# Patient Record
Sex: Female | Born: 1965
Health system: Southern US, Community
[De-identification: ages and names within clinical notes are randomized; demographics above are authoritative.]

## PROBLEM LIST (undated history)

## (undated) DIAGNOSIS — C801 Malignant (primary) neoplasm, unspecified: Secondary | ICD-10-CM

## (undated) DIAGNOSIS — Z923 Personal history of irradiation: Secondary | ICD-10-CM

## (undated) DIAGNOSIS — U071 COVID-19: Secondary | ICD-10-CM

## (undated) DIAGNOSIS — G43909 Migraine, unspecified, not intractable, without status migrainosus: Secondary | ICD-10-CM

## (undated) DIAGNOSIS — Z8719 Personal history of other diseases of the digestive system: Secondary | ICD-10-CM

## (undated) DIAGNOSIS — F419 Anxiety disorder, unspecified: Secondary | ICD-10-CM

## (undated) DIAGNOSIS — IMO0002 Reserved for concepts with insufficient information to code with codable children: Secondary | ICD-10-CM

## (undated) HISTORY — DX: Anxiety disorder, unspecified: F41.9

## (undated) HISTORY — PX: AUGMENTATION MAMMAPLASTY: SUR837

## (undated) HISTORY — DX: Malignant (primary) neoplasm, unspecified: C80.1

## (undated) HISTORY — DX: Migraine, unspecified, not intractable, without status migrainosus: G43.909

## (undated) HISTORY — DX: Reserved for concepts with insufficient information to code with codable children: IMO0002

---

## 1999-03-18 ENCOUNTER — Ambulatory Visit (HOSPITAL_COMMUNITY): Admission: RE | Admit: 1999-03-18 | Discharge: 1999-03-18 | Payer: Self-pay | Admitting: Obstetrics and Gynecology

## 1999-03-18 ENCOUNTER — Encounter: Payer: Self-pay | Admitting: Obstetrics and Gynecology

## 1999-03-18 ENCOUNTER — Inpatient Hospital Stay (HOSPITAL_COMMUNITY): Admission: AD | Admit: 1999-03-18 | Discharge: 1999-03-21 | Payer: Self-pay | Admitting: Obstetrics & Gynecology

## 2001-03-04 ENCOUNTER — Other Ambulatory Visit: Admission: RE | Admit: 2001-03-04 | Discharge: 2001-03-04 | Payer: Self-pay | Admitting: *Deleted

## 2002-04-01 ENCOUNTER — Other Ambulatory Visit: Admission: RE | Admit: 2002-04-01 | Discharge: 2002-04-01 | Payer: Self-pay | Admitting: *Deleted

## 2002-09-30 HISTORY — PX: FOOT SURGERY: SHX648

## 2003-04-06 ENCOUNTER — Other Ambulatory Visit: Admission: RE | Admit: 2003-04-06 | Discharge: 2003-04-06 | Payer: Self-pay | Admitting: *Deleted

## 2004-03-17 ENCOUNTER — Ambulatory Visit: Payer: Self-pay | Admitting: Internal Medicine

## 2004-05-19 ENCOUNTER — Other Ambulatory Visit: Admission: RE | Admit: 2004-05-19 | Discharge: 2004-05-19 | Payer: Self-pay | Admitting: Obstetrics and Gynecology

## 2004-05-20 ENCOUNTER — Ambulatory Visit: Payer: Self-pay | Admitting: Internal Medicine

## 2004-05-23 ENCOUNTER — Ambulatory Visit: Payer: Self-pay

## 2004-06-17 ENCOUNTER — Ambulatory Visit: Payer: Self-pay | Admitting: Internal Medicine

## 2005-03-02 ENCOUNTER — Ambulatory Visit: Payer: Self-pay | Admitting: Internal Medicine

## 2005-06-12 ENCOUNTER — Other Ambulatory Visit: Admission: RE | Admit: 2005-06-12 | Discharge: 2005-06-12 | Payer: Self-pay | Admitting: Obstetrics and Gynecology

## 2005-09-08 ENCOUNTER — Ambulatory Visit: Payer: Self-pay | Admitting: Internal Medicine

## 2005-10-16 ENCOUNTER — Ambulatory Visit: Payer: Self-pay | Admitting: Internal Medicine

## 2006-01-05 ENCOUNTER — Ambulatory Visit: Payer: Self-pay | Admitting: Internal Medicine

## 2006-02-23 ENCOUNTER — Ambulatory Visit: Payer: Self-pay | Admitting: Internal Medicine

## 2006-06-01 ENCOUNTER — Ambulatory Visit: Payer: Self-pay | Admitting: Internal Medicine

## 2006-10-04 ENCOUNTER — Ambulatory Visit: Payer: Self-pay | Admitting: Internal Medicine

## 2006-10-04 LAB — CONVERTED CEMR LAB
ALT: 10 units/L (ref 0–40)
AST: 17 units/L (ref 0–37)
Albumin: 4.2 g/dL (ref 3.5–5.2)
Alkaline Phosphatase: 29 units/L — ABNORMAL LOW (ref 39–117)
BUN: 11 mg/dL (ref 6–23)
Basophils Absolute: 0 10*3/uL (ref 0.0–0.1)
Basophils Relative: 0.8 % (ref 0.0–1.0)
Bilirubin, Direct: 0.1 mg/dL (ref 0.0–0.3)
CO2: 29 meq/L (ref 19–32)
Calcium: 9.2 mg/dL (ref 8.4–10.5)
Chloride: 107 meq/L (ref 96–112)
Cholesterol: 177 mg/dL (ref 0–200)
Creatinine, Ser: 0.9 mg/dL (ref 0.4–1.2)
Eosinophils Absolute: 0.1 10*3/uL (ref 0.0–0.6)
Eosinophils Relative: 1.5 % (ref 0.0–5.0)
GFR calc Af Amer: 90 mL/min
GFR calc non Af Amer: 74 mL/min
Glucose, Bld: 94 mg/dL (ref 70–99)
HCT: 39.2 % (ref 36.0–46.0)
HDL: 45.9 mg/dL (ref >39.0)
Hemoglobin: 13.7 g/dL (ref 12.0–15.0)
LDL Cholesterol: 111 mg/dL — ABNORMAL HIGH (ref 0–99)
Lymphocytes Relative: 30 % (ref 12.0–46.0)
MCHC: 34.9 g/dL (ref 30.0–36.0)
MCV: 85.7 fL (ref 78.0–100.0)
Monocytes Absolute: 0.4 10*3/uL (ref 0.2–0.7)
Monocytes Relative: 8.3 % (ref 3.0–11.0)
Neutro Abs: 2.9 10*3/uL (ref 1.4–7.7)
Neutrophils Relative %: 59.4 % (ref 43.0–77.0)
Platelets: 219 10*3/uL (ref 150–400)
Potassium: 4.1 meq/L (ref 3.5–5.1)
RBC: 4.58 M/uL (ref 3.87–5.11)
RDW: 12.8 % (ref 11.5–14.6)
Sodium: 140 meq/L (ref 135–145)
TSH: 1.06 microintl units/mL (ref 0.35–5.50)
Total Bilirubin: 0.9 mg/dL (ref 0.3–1.2)
Total CHOL/HDL Ratio: 3.9
Total Protein: 6.9 g/dL (ref 6.0–8.3)
Triglycerides: 103 mg/dL (ref 0–149)
VLDL: 21 mg/dL (ref 0–40)
WBC: 4.8 10*3/uL (ref 4.5–10.5)

## 2006-10-30 ENCOUNTER — Ambulatory Visit: Payer: Self-pay | Admitting: Internal Medicine

## 2006-10-30 DIAGNOSIS — R519 Headache, unspecified: Secondary | ICD-10-CM | POA: Insufficient documentation

## 2006-10-30 DIAGNOSIS — R51 Headache: Secondary | ICD-10-CM

## 2006-10-30 DIAGNOSIS — G43909 Migraine, unspecified, not intractable, without status migrainosus: Secondary | ICD-10-CM

## 2007-02-12 ENCOUNTER — Ambulatory Visit: Payer: Self-pay | Admitting: Internal Medicine

## 2007-05-10 ENCOUNTER — Ambulatory Visit: Payer: Self-pay | Admitting: Internal Medicine

## 2007-11-13 ENCOUNTER — Ambulatory Visit: Payer: Self-pay | Admitting: Internal Medicine

## 2007-11-13 DIAGNOSIS — L570 Actinic keratosis: Secondary | ICD-10-CM

## 2008-02-04 ENCOUNTER — Ambulatory Visit: Payer: Self-pay | Admitting: Internal Medicine

## 2008-05-06 ENCOUNTER — Ambulatory Visit: Payer: Self-pay | Admitting: Internal Medicine

## 2008-05-06 LAB — CONVERTED CEMR LAB
ALT: 14 units/L (ref 0–35)
AST: 23 units/L (ref 0–37)
Albumin: 4.3 g/dL (ref 3.5–5.2)
Alkaline Phosphatase: 35 units/L — ABNORMAL LOW (ref 39–117)
BUN: 13 mg/dL (ref 6–23)
Basophils Absolute: 0 10*3/uL (ref 0.0–0.1)
Basophils Relative: 0 % (ref 0.0–3.0)
Bilirubin Urine: NEGATIVE
Bilirubin, Direct: 0.1 mg/dL (ref 0.0–0.3)
Blood in Urine, dipstick: NEGATIVE
CO2: 29 meq/L (ref 19–32)
Calcium: 9.3 mg/dL (ref 8.4–10.5)
Chloride: 105 meq/L (ref 96–112)
Cholesterol: 195 mg/dL (ref 0–200)
Creatinine, Ser: 0.8 mg/dL (ref 0.4–1.2)
Eosinophils Absolute: 0.1 10*3/uL (ref 0.0–0.7)
Eosinophils Relative: 1.6 % (ref 0.0–5.0)
GFR calc Af Amer: 102 mL/min
GFR calc non Af Amer: 84 mL/min
Glucose, Bld: 97 mg/dL (ref 70–99)
Glucose, Urine, Semiquant: NEGATIVE
HCT: 38.4 % (ref 36.0–46.0)
HDL: 51 mg/dL (ref >39.0)
Hemoglobin: 13.4 g/dL (ref 12.0–15.0)
Ketones, urine, test strip: NEGATIVE
LDL Cholesterol: 128 mg/dL — ABNORMAL HIGH (ref 0–99)
Lymphocytes Relative: 24.5 % (ref 12.0–46.0)
MCHC: 34.8 g/dL (ref 30.0–36.0)
MCV: 86.6 fL (ref 78.0–100.0)
Monocytes Absolute: 0.3 10*3/uL (ref 0.1–1.0)
Monocytes Relative: 7.1 % (ref 3.0–12.0)
Neutro Abs: 2.8 10*3/uL (ref 1.4–7.7)
Neutrophils Relative %: 66.8 % (ref 43.0–77.0)
Nitrite: NEGATIVE
Platelets: 199 10*3/uL (ref 150–400)
Potassium: 4.3 meq/L (ref 3.5–5.1)
Protein, U semiquant: NEGATIVE
RBC: 4.44 M/uL (ref 3.87–5.11)
RDW: 12.4 % (ref 11.5–14.6)
Sodium: 141 meq/L (ref 135–145)
Specific Gravity, Urine: 1.02
TSH: 1.04 microintl units/mL (ref 0.35–5.50)
Total Bilirubin: 0.7 mg/dL (ref 0.3–1.2)
Total CHOL/HDL Ratio: 3.8
Total Protein: 7.3 g/dL (ref 6.0–8.3)
Triglycerides: 81 mg/dL (ref 0–149)
Urobilinogen, UA: 0.2
VLDL: 16 mg/dL (ref 0–40)
WBC Urine, dipstick: NEGATIVE
WBC: 4.2 10*3/uL — ABNORMAL LOW (ref 4.5–10.5)
pH: 7

## 2008-05-13 ENCOUNTER — Ambulatory Visit: Payer: Self-pay | Admitting: Internal Medicine

## 2008-10-02 ENCOUNTER — Encounter: Payer: Self-pay | Admitting: Internal Medicine

## 2008-10-13 ENCOUNTER — Telehealth: Payer: Self-pay | Admitting: Internal Medicine

## 2009-01-26 ENCOUNTER — Ambulatory Visit: Payer: Self-pay | Admitting: Internal Medicine

## 2009-02-16 ENCOUNTER — Telehealth: Payer: Self-pay | Admitting: *Deleted

## 2009-06-14 ENCOUNTER — Telehealth: Payer: Self-pay | Admitting: Internal Medicine

## 2009-06-30 ENCOUNTER — Ambulatory Visit: Payer: Self-pay | Admitting: Internal Medicine

## 2009-06-30 DIAGNOSIS — H6120 Impacted cerumen, unspecified ear: Secondary | ICD-10-CM

## 2009-09-28 LAB — CONVERTED CEMR LAB: Pap Smear: NORMAL

## 2009-10-06 ENCOUNTER — Encounter: Admission: RE | Admit: 2009-10-06 | Discharge: 2009-10-06 | Payer: Self-pay | Admitting: Obstetrics and Gynecology

## 2009-11-24 ENCOUNTER — Telehealth: Payer: Self-pay | Admitting: Family Medicine

## 2009-11-24 ENCOUNTER — Ambulatory Visit: Payer: Self-pay | Admitting: Family Medicine

## 2009-11-24 DIAGNOSIS — H00019 Hordeolum externum unspecified eye, unspecified eyelid: Secondary | ICD-10-CM | POA: Insufficient documentation

## 2009-12-20 ENCOUNTER — Ambulatory Visit: Payer: Self-pay | Admitting: Internal Medicine

## 2009-12-20 LAB — CONVERTED CEMR LAB
Alkaline Phosphatase: 32 units/L — ABNORMAL LOW (ref 39–117)
BUN: 14 mg/dL (ref 6–23)
Bilirubin Urine: NEGATIVE
Bilirubin, Direct: 0.2 mg/dL (ref 0.0–0.3)
Blood in Urine, dipstick: NEGATIVE
Calcium: 9.2 mg/dL (ref 8.4–10.5)
Chloride: 104 meq/L (ref 96–112)
Cholesterol: 166 mg/dL (ref 0–200)
Creatinine, Ser: 0.9 mg/dL (ref 0.4–1.2)
Eosinophils Absolute: 0.1 10*3/uL (ref 0.0–0.7)
Eosinophils Relative: 1.9 % (ref 0.0–5.0)
Glucose, Urine, Semiquant: NEGATIVE
HDL: 39.7 mg/dL (ref >39.00)
Ketones, urine, test strip: NEGATIVE
LDL Cholesterol: 113 mg/dL — ABNORMAL HIGH (ref 0–99)
Lymphocytes Relative: 29.6 % (ref 12.0–46.0)
MCHC: 34.3 g/dL (ref 30.0–36.0)
MCV: 86.8 fL (ref 78.0–100.0)
Monocytes Absolute: 0.4 10*3/uL (ref 0.1–1.0)
Neutrophils Relative %: 59.2 % (ref 43.0–77.0)
Nitrite: NEGATIVE
Platelets: 213 10*3/uL (ref 150.0–400.0)
Protein, U semiquant: NEGATIVE
RBC: 4.4 M/uL (ref 3.87–5.11)
Specific Gravity, Urine: 1.015
Total Bilirubin: 1 mg/dL (ref 0.3–1.2)
Total CHOL/HDL Ratio: 4
Triglycerides: 69 mg/dL (ref 0.0–149.0)
Urobilinogen, UA: 0.2
WBC: 4.5 10*3/uL (ref 4.5–10.5)
pH: 7.5

## 2009-12-21 ENCOUNTER — Telehealth: Payer: Self-pay | Admitting: Internal Medicine

## 2009-12-27 ENCOUNTER — Ambulatory Visit: Payer: Self-pay | Admitting: Internal Medicine

## 2010-05-29 LAB — CONVERTED CEMR LAB: Pap Smear: NORMAL

## 2010-05-31 NOTE — Assessment & Plan Note (Signed)
Summary: fu on meds/njr   Vital Signs:  Patient profile:   45 year old female Height:      66 inches Weight:      138 pounds BMI:     22.35 Temp:     98.2 degrees F oral Pulse rate:   72 / minute Resp:     14 per minute BP sitting:   110 / 70  (left arm)  Vitals Entered By: Willy Eddy, LPN (June 30, 1608 10:01 AM) CC: roa, Headache   CC:  roa and Headache.  History of Present Illness: The pt presents for follow up HA patter has been stable with menstrual timing and weather changes as thje primary preceiptants with 3-4 a month average that has not changed. flair of allergies and ear pressure in the left ear  Headache HPI:      The patient comes in for chronic management of stable headaches.  Since the last visit, the frequency of headaches have not changed, and the intensity of the headaches have not changed.  The headaches generally will last anywhere from 15 minutes to 2 hours at a time.  She has approximately 4 headaches per month.  The patient is right handed.        The location of the headaches are unilateral-left.  Headache quality is throbbing or pulsating.  Precipitating factors consist of changes in weather and perimenstrual.  The headaches are associated with nausea and photophobia.         Problems Prior to Update: 1)  Physical Examination  (ICD-V70.0) 2)  Actinic Keratosis, Cheek, Left  (ICD-702.0) 3)  Headache  (ICD-784.0) 4)  Migraine Nos w/o Intractable Migraine  (ICD-346.90)  Current Problems (verified): 1)  Physical Examination  (ICD-V70.0) 2)  Actinic Keratosis, Cheek, Left  (ICD-702.0) 3)  Headache  (ICD-784.0) 4)  Migraine Nos w/o Intractable Migraine  (ICD-346.90)  Medications Prior to Update: 1)  Imitrex 20 Mg/act Soln (Sumatriptan) .Marland Kitchen.. 1 Spray  in 1 Nostril As Needed Migraine 2)  Multivitamins   Tabs (Multiple Vitamin) .Marland Kitchen.. 1 By Mouth Once Daily 3)  Calcium 500 500 Mg  Tabs (Calcium Carbonate) .Marland Kitchen.. 1 By Mouth Once Daily 4)  Allegra-D 12  Hour 60-120 Mg Tb12 (Fexofenadine-Pseudoephedrine) .Marland Kitchen.. 1 By Mouth Once Daily As Needed  Current Medications (verified): 1)  Imitrex 20 Mg/act Soln (Sumatriptan) .Marland Kitchen.. 1 Spray  in 1 Nostril As Needed Migraine 2)  Multivitamins   Tabs (Multiple Vitamin) .Marland Kitchen.. 1 By Mouth Once Daily 3)  Calcium 500 500 Mg  Tabs (Calcium Carbonate) .Marland Kitchen.. 1 By Mouth Once Daily 4)  Allegra-D 12 Hour 60-120 Mg Tb12 (Fexofenadine-Pseudoephedrine) .Marland Kitchen.. 1 By Mouth Once Daily As Needed 5)  Sumavel Dosepro 6 Mg/0.31ml Devi (Sumatriptan Succinate) .... Use As Directed  Allergies (verified): No Known Drug Allergies  Past History:  Family History: Last updated: 05/10/2007 Family History High cholesterol Family History Hypertension  Social History: Last updated: 05/10/2007 Retired Married Never Smoked  Risk Factors: Smoking Status: never (05/10/2007)  Past medical, surgical, family and social histories (including risk factors) reviewed, and no changes noted (except as noted below).  Past Medical History: Reviewed history from 10/30/2006 and no changes required. Headache  Past Surgical History: Reviewed history from 05/10/2007 and no changes required. foof surgery for hammer toe rt foot Caesarean section  Family History: Reviewed history from 05/10/2007 and no changes required. Family History High cholesterol Family History Hypertension  Social History: Reviewed history from 05/10/2007 and no changes required. Retired Married Never  Smoked  Review of Systems  The patient denies anorexia, fever, weight loss, weight gain, vision loss, decreased hearing, hoarseness, chest pain, syncope, dyspnea on exertion, peripheral edema, prolonged cough, headaches, hemoptysis, abdominal pain, melena, hematochezia, severe indigestion/heartburn, hematuria, incontinence, genital sores, muscle weakness, suspicious skin lesions, transient blindness, difficulty walking, depression, unusual weight change, abnormal bleeding,  enlarged lymph nodes, angioedema, and breast masses.    Physical Exam  General:  Well-developed,well-nourished,in no acute distress; alert,appropriate and cooperative throughout examination Head:  Normocephalic and atraumatic without obvious abnormalities. No apparent alopecia or balding. Eyes:  pupils equal and pupils round.   Ears:  L canal inflamed and L Cerumen impaction.   Nose:  no external deformity and no nasal discharge.   Neck:  No deformities, masses, or tenderness noted. Lungs:  Normal respiratory effort, chest expands symmetrically. Lungs are clear to auscultation, no crackles or wheezes. Heart:  Normal rate and regular rhythm. S1 and S2 normal without gallop, murmur, click, rub or other extra sounds. Abdomen:  Bowel sounds positive,abdomen soft and non-tender without masses, organomegaly or hernias noted. Msk:  No deformity or scoliosis noted of thoracic or lumbar spine.     Impression & Recommendations:  Problem # 1:  MIGRAINE NOS W/O INTRACTABLE MIGRAINE (ICD-346.90)  Her updated medication list for this problem includes:    Imitrex 20 Mg/act Soln (Sumatriptan) .Marland Kitchen... 1 spray  in 1 nostril as needed migraine    Sumavel Dosepro 6 Mg/0.78ml Devi (Sumatriptan succinate) ..... Use as directed  Headache diary reviewed.  Problem # 2:  ACTINIC KERATOSIS, CHEEK, LEFT (ICD-702.0) reveiwed site  Problem # 3:  IMPACTED CERUMEN (ICD-380.4)  informed conset obtained, using a cerumin spoon the wax impaction was dislodged and the canal was lavaged with 1/2 peroxide and 1/2 warm water solution until clear  Orders: Cerumen Impaction Removal (71062)  Complete Medication List: 1)  Imitrex 20 Mg/act Soln (Sumatriptan) .Marland Kitchen.. 1 spray  in 1 nostril as needed migraine 2)  Multivitamins Tabs (Multiple vitamin) .Marland Kitchen.. 1 by mouth once daily 3)  Calcium 500 500 Mg Tabs (Calcium carbonate) .Marland Kitchen.. 1 by mouth once daily 4)  Allegra-d 12 Hour 60-120 Mg Tb12 (Fexofenadine-pseudoephedrine) .Marland Kitchen.. 1 by  mouth once daily as needed 5)  Sumavel Dosepro 6 Mg/0.58ml Devi (Sumatriptan succinate) .... Use as directed  Patient Instructions: 1)  CPX  6 months Prescriptions: SUMAVEL DOSEPRO 6 MG/0.5ML DEVI (SUMATRIPTAN SUCCINATE) use as directed  #4 x 4   Entered and Authorized by:   Stacie Glaze MD   Signed by:   Stacie Glaze MD on 06/30/2009   Method used:   Print then Give to Patient   RxID:   [id number]

## 2010-05-31 NOTE — Progress Notes (Signed)
Summary: mailorder rx  Phone Note Call from Patient Call back at Home Phone [phone]   Caller: Patient Call For: Stacie Glaze MD Summary of Call: pt needs from  [facility] sumptriptan 20 mg ns or whatever on preferred list to be fax to [facility] [phone] with refills  Initial call taken by: Heron Sabins,  December 21, 2009 8:46 AM    Prescriptions: Willette Brace 20 MG/ACT SOLN (SUMATRIPTAN) 1 spray  in 1 nostril as needed migraine  #27 x 3   Entered by:   Willy Eddy, LPN   Authorized by:   Stacie Glaze MD   Signed by:   Willy Eddy, LPN on 95/62/1308   Method used:   Electronically to        [facility]* (retail)             ,          Ph: [phone]       Fax: [phone]   RxID:   [id number]

## 2010-05-31 NOTE — Progress Notes (Signed)
Summary: mailorder rx's  Phone Note Call from Patient Call back at [facility] Phone [phone]   Caller: Patient Call For: Stacie Glaze MD Summary of Call: [facility] pharmacist [phone] sumatriptan 20 mg ns #18 and allegra d 12 hours #90 with 3 refills Initial call taken by: Heron Sabins,  June 14, 2009 10:31 AM  Follow-up for Phone Call        pt needs ov-none since 05-2008 Follow-up by: Willy Eddy, LPN,  June 14, 2009 10:35 AM  Additional Follow-up for Phone Call Additional follow up Details #1::        ov 06-30-2009 9.30am Additional Follow-up by: Heron Sabins,  June 14, 2009 11:10 AM

## 2010-05-31 NOTE — Assessment & Plan Note (Signed)
Summary: cpx//ccm   Vital Signs:  Patient profile:   45 year old female Height:      66 inches Weight:      138 pounds BMI:     22.35 Temp:     98.2 degrees F oral Pulse rate:   76 / minute Resp:     14 per minute BP sitting:   120 / 78  (left arm)  Vitals Entered By: Willy Eddy, LPN (December 27, 2009 8:48 AM) CC: cpx Is Patient Diabetic? No   CC:  cpx.  History of Present Illness: The pt was asked about all immunizations, health maint. services that are appropriate to their age and was given guidance on diet exercize  and weight management   on the superior aspect of the upper lid of the left eye ? stye treated with  orals and did not improve ( saw dr Perley Jain) not painfull iritating   Preventive Screening-Counseling & Management  Alcohol-Tobacco     Smoking Status: never  Current Medications (verified): 1)  Imitrex 20 Mg/act Soln (Sumatriptan) .Marland Kitchen.. 1 Spray  in 1 Nostril As Needed Migraine 2)  Multivitamins   Tabs (Multiple Vitamin) .Marland Kitchen.. 1 By Mouth Once Daily 3)  Calcium 500 500 Mg  Tabs (Calcium Carbonate) .Marland Kitchen.. 1 By Mouth Once Daily 4)  Allegra-D 12 Hour 60-120 Mg Tb12 (Fexofenadine-Pseudoephedrine) .Marland Kitchen.. 1 By Mouth Once Daily As Needed 5)  Sumavel Dosepro 6 Mg/0.50ml Devi (Sumatriptan Succinate) .... Use As Directed  Allergies (verified): No Known Drug Allergies  Physical Exam  General:  Well-developed,well-nourished,in no acute distress; alert,appropriate and cooperative throughout examination Head:  Normocephalic and atraumatic without obvious abnormalities. Eyes:  No corneal or conjunctival inflammation noted. EOMI. Perrla. Funduscopic exam benign, without hemorrhages, exudates or papilledema. Vision grossly normal. small stye on upper left eye lid, mildly erythematous Ears:  L TM erythema and L TM retraction.  R TM mildly retracted, slight clear fluid behind. Nose:  External nasal examination shows no deformity or inflammation. Nasal mucosa are pink and  moist without lesions or exudates. Mouth:  Oral mucosa and oropharynx without lesions or exudates.  Teeth in good repair. Neck:  No deformities, masses, or tenderness noted. Lungs:  Normal respiratory effort, chest expands symmetrically. Lungs are clear to auscultation, no crackles or wheezes. Heart:  Normal rate and regular rhythm. S1 and S2 normal without gallop, murmur, click, rub or other extra sounds. Abdomen:  Bowel sounds positive,abdomen soft and non-tender without masses, organomegaly or hernias noted. Msk:  No deformity or scoliosis noted of thoracic or lumbar spine.   Extremities:  No clubbing, cyanosis, edema, or deformity noted    Skin:  Intact without suspicious lesions or rashes Cervical Nodes:  No lymphadenopathy noted Axillary Nodes:  No palpable lymphadenopathy Inguinal Nodes:  No significant adenopathy Psych:  Cognition and judgment appear intact. Alert and cooperative with normal attention span and concentration. No apparent delusions, illusions, hallucinations   Impression & Recommendations:  Problem # 1:  STYE (ICD-373.11)  small cyst at follicals on left upper eyelid referral to opthamology  Orders: Ophthalmology Referral (Ophthalmology)  Problem # 2:  PHYSICAL EXAMINATION (ICD-V70.0) The pt was asked about all immunizations, health maint. services that are appropriate to their age and was given guidance on diet exercize  and weight management  Mammogram: abnormal (10/28/2009) Pap smear: normal (09/28/2009) Td Booster: Historical (05/03/2006)   Flu Vax: Fluvax 3+ (01/26/2009)   Chol: 166 (12/20/2009)   HDL: 39.70 (12/20/2009)   LDL: 113 (12/20/2009)  TG: 69.0 (12/20/2009) TSH: 1.38 (12/20/2009)   Next mammogram due:: 10/2010 (12/27/2009)  Discussed using sunscreen, use of alcohol, drug use, self breast exam, routine dental care, routine eye care, schedule for GYN exam, routine physical exam, seat belts, multiple vitamins, osteoporosis prevention, adequate  calcium intake in diet, recommendations for immunizations, mammograms and Pap smears.  Discussed exercise and checking cholesterol.  Discussed gun safety, safe sex, and contraception.  Complete Medication List: 1)  Imitrex 20 Mg/act Soln (Sumatriptan) .Marland Kitchen.. 1 spray  in 1 nostril as needed migraine 2)  Multivitamins Tabs (Multiple vitamin) .Marland Kitchen.. 1 by mouth once daily 3)  Calcium 500 500 Mg Tabs (Calcium carbonate) .Marland Kitchen.. 1 by mouth once daily 4)  Allegra-d 12 Hour 60-120 Mg Tb12 (Fexofenadine-pseudoephedrine) .Marland Kitchen.. 1 by mouth once daily as needed 5)  Tobramycin-dexamethasone 0.3-0.1 % Susp (Tobramycin-dexamethasone) .... 2 drops every 4 hours while awake in the left eye  Patient Instructions: 1)  Please schedule a follow-up appointment in 1 year.  cpx Prescriptions: ALLEGRA-D 12 HOUR 60-120 MG TB12 (FEXOFENADINE-PSEUDOEPHEDRINE) 1 by mouth once daily as needed  #90 x 3   Entered and Authorized by:   Stacie Glaze MD   Signed by:   Stacie Glaze MD on 12/27/2009   Method used:   Faxed to ...       [facility] (mail-order)             , [place]         Ph: [phone]       Fax: [phone]   RxID:   [id number] TOBRAMYCIN-DEXAMETHASONE 0.3-0.1 % SUSP (TOBRAMYCIN-DEXAMETHASONE) 2 drops every 4 hours while awake in the left eye  #5cc x 0   Entered and Authorized by:   Stacie Glaze MD   Signed by:   Stacie Glaze MD on 12/27/2009   Method used:   Electronically to        [facility]  [phone]* (retail)       [address]       [place], [place]  [address]       Ph: [phone] or [phone]       Fax: [phone]   RxID:   [id number]    Preventive Care Screening  Mammogram:    Date:  10/28/2009    Next Due:  10/2010    Results:  abnormal   Pap Smear:    Date:  09/28/2009    Next Due:  09/2010    Results:  normal   Appended Document: Orders Update Flu Vaccine Consent Questions     Do you have a history of severe allergic reactions to  this vaccine? no    Any prior history of allergic reactions to egg and/or gelatin? no    Do you have a sensitivity to the preservative Thimersol? no    Do you have a past history of Guillan-Barre Syndrome? no    Do you currently have an acute febrile illness? no    Have you ever had a severe reaction to latex? no    Vaccine information given and explained to patient? yes    Are you currently pregnant? no    Lot Number:[id number]   Exp Date:10/29/2010   Site Given  Left Deltoid IM inical Lists Changes  Orders: Added new Service order of Admin 1st Vaccine (01601) - Signed Added new Service order of Flu Vaccine 76yrs + [phone]) - Signed Observations: Added new observation of FLU VAX  VIS: 12/08/09 version (12/27/2009 9:25) Added new observation of FLU VAXLOT: [id number] (12/27/2009 9:25) Added new observation of FLU VAXMFR: [employer] (12/27/2009 9:25) Added new observation of FLU VAX EXP: 10/29/2010 (12/27/2009 9:25) Added new observation of FLU VAX DSE: 0.7ml (12/27/2009 9:25) Added new observation of FLU VAX: Fluvax 3+ (12/27/2009 9:25)

## 2010-05-31 NOTE — Progress Notes (Signed)
Summary: keflex  Phone Note From Pharmacy   Summary of Call: [facility] calling about antibiotic that Dr. Abner Greenspan ordered today.  Note shows that it was sent [facility].  Patient wants to pick it up locally & get started on it.  Gave them the RX in Dr. Mariel Aloe note.   Initial call taken by: Rudy Jew, RN,  November 24, 2009 4:47 PM  Follow-up for Phone Call        Thanks Follow-up by: Danise Edge MD,  November 25, 2009 10:00 AM

## 2010-05-31 NOTE — Assessment & Plan Note (Signed)
Summary: ?SINUS INF/NJR   Vital Signs:  Patient profile:   45 year old female Height:      66 inches (167.64 cm) Weight:      138 pounds (62.73 kg) O2 Sat:      99 % on Room air Temp:     98.7 degrees F (37.06 degrees C) oral Pulse rate:   95 / minute BP sitting:   114 / 78  (left arm) Cuff size:   regular  Vitals Entered By: Josph Macho RMA (November 24, 2009 3:19 PM)  O2 Flow:  Room air CC: Possible sinus infection X5 days, sty on left eye, congestion, left ear blocked, coughing up greenish-yellow phlegm/ CF Is Patient Diabetic? No   History of Present Illness: Patient in today with 5 days history of worsening nasal congestion. stye on left eye and nasal congestion. Ther throat feels clogged, PND cough productive of green sputum. L ear pressure. Right ear very mild symptoms. Notes also HA, myalgiase. Used some Auralgan but it did not help. No chest pain/congestion/SOB/palpitations. Patient without diarrhea/nausea/vomitting.  Current Medications (verified): 1)  Imitrex 20 Mg/act Soln (Sumatriptan) .Marland Kitchen.. 1 Spray  in 1 Nostril As Needed Migraine 2)  Multivitamins   Tabs (Multiple Vitamin) .Marland Kitchen.. 1 By Mouth Once Daily 3)  Calcium 500 500 Mg  Tabs (Calcium Carbonate) .Marland Kitchen.. 1 By Mouth Once Daily 4)  Allegra-D 12 Hour 60-120 Mg Tb12 (Fexofenadine-Pseudoephedrine) .Marland Kitchen.. 1 By Mouth Once Daily As Needed 5)  Sumavel Dosepro 6 Mg/0.63ml Devi (Sumatriptan Succinate) .... Use As Directed  Allergies (verified): No Known Drug Allergies  Past History:  Past medical history reviewed for relevance to current acute and chronic problems. Social history (including risk factors) reviewed for relevance to current acute and chronic problems.  Past Medical History: Reviewed history from 10/30/2006 and no changes required. Headache  Social History: Reviewed history from 05/10/2007 and no changes required. Retired Married Never Smoked  Review of Systems  The patient denies anorexia, fever, weight  loss, weight gain, vision loss, decreased hearing, hoarseness, chest pain, syncope, dyspnea on exertion, peripheral edema, prolonged cough, headaches, hemoptysis, abdominal pain, melena, hematochezia, severe indigestion/heartburn, hematuria, incontinence, muscle weakness, suspicious skin lesions, transient blindness, difficulty walking, depression, unusual weight change, abnormal bleeding, and enlarged lymph nodes.    Physical Exam  General:  Well-developed,well-nourished,in no acute distress; alert,appropriate and cooperative throughout examination Head:  Normocephalic and atraumatic without obvious abnormalities. Eyes:  No corneal or conjunctival inflammation noted. EOMI. Perrla. Funduscopic exam benign, without hemorrhages, exudates or papilledema. Vision grossly normal. small stye on upper left eye lid, mildly erythematous Ears:  L TM erythema and L TM retraction.  R TM mildly retracted, slight clear fluid behind. Nose:  External nasal examination shows no deformity or inflammation. Nasal mucosa are pink and moist without lesions or exudates. Mouth:  Oral mucosa and oropharynx without lesions or exudates.  Teeth in good repair. Neck:  No deformities, masses, or tenderness noted. Lungs:  Normal respiratory effort, chest expands symmetrically. Lungs are clear to auscultation, no crackles or wheezes. Heart:  Normal rate and regular rhythm. S1 and S2 normal without gallop, murmur, click, rub or other extra sounds. Abdomen:  Bowel sounds positive,abdomen soft and non-tender without masses, organomegaly or hernias noted. Extremities:  No clubbing, cyanosis, edema, or deformity noted    Cervical Nodes:  No lymphadenopathy noted Psych:  Cognition and judgment appear intact. Alert and cooperative with normal attention span and concentration. No apparent delusions, illusions, hallucinations   Impression & Recommendations:  Problem # 1:  LOM (ICD-382.9)  Her updated medication list for this problem  includes:    Keflex 500 Mg Caps (Cephalexin) .Marland Kitchen... 1 tab by mouth 4 x daily x 10days Mucinex two times a day, push fluids, report any worsening symptoms  Problem # 2:  STYE (ICD-373.11) left eye, small, Moist compresses to eye lid with gentle massage three times a day and as needed   Complete Medication List: 1)  Imitrex 20 Mg/act Soln (Sumatriptan) .Marland Kitchen.. 1 spray  in 1 nostril as needed migraine 2)  Multivitamins Tabs (Multiple vitamin) .Marland Kitchen.. 1 by mouth once daily 3)  Calcium 500 500 Mg Tabs (Calcium carbonate) .Marland Kitchen.. 1 by mouth once daily 4)  Allegra-d 12 Hour 60-120 Mg Tb12 (Fexofenadine-pseudoephedrine) .Marland Kitchen.. 1 by mouth once daily as needed 5)  Sumavel Dosepro 6 Mg/0.25ml Devi (Sumatriptan succinate) .... Use as directed 6)  Keflex 500 Mg Caps (Cephalexin) .Marland Kitchen.. 1 tab by mouth 4 x daily x 10days  Patient Instructions: 1)  Please schedule a follow-up appointment as needed if symptoms worsen or do not improve. 2)  Take your antibiotic as prescribed until ALL of it is gone, but stop if you develop a rash or swelling and contact our office as soon as possible.  3)  Acute sinusitis symptoms for less than 10 days are not helped by antibiotics. Use warm moist compresses, and over the counter decongestants( only as directed). Call if no improvement in 5-7 days, sooner if increasing pain, fever, or new symptoms.  4)  For stye apply warm compresses three times a day with gentle massage, report if no resolution for referral 5)  Take Mucinex two times a day x 10days and may take Benadryl 25mg  1 tab at bedtime for congestion Prescriptions: KEFLEX 500 MG CAPS (CEPHALEXIN) 1 tab by mouth 4 x daily x 10days  #40 x 0   Entered and Authorized by:   Danise Edge MD   Signed by:   Danise Edge MD on 11/24/2009   Method used:   Electronically to        [facility]* (retail)             ,          Ph: [phone]       Fax: [phone]   RxID:   [id number]

## 2010-09-12 ENCOUNTER — Emergency Department (HOSPITAL_COMMUNITY)
Admission: EM | Admit: 2010-09-12 | Discharge: 2010-09-13 | Disposition: A | Discharge status: Home / Self Care | Payer: 59 | Attending: Emergency Medicine | Admitting: Emergency Medicine

## 2010-09-12 DIAGNOSIS — L02419 Cutaneous abscess of limb, unspecified: Secondary | ICD-10-CM | POA: Insufficient documentation

## 2010-09-12 DIAGNOSIS — L03119 Cellulitis of unspecified part of limb: Secondary | ICD-10-CM | POA: Insufficient documentation

## 2010-09-12 DIAGNOSIS — G43909 Migraine, unspecified, not intractable, without status migrainosus: Secondary | ICD-10-CM | POA: Insufficient documentation

## 2010-09-16 NOTE — Op Note (Signed)
[facility]  Patient:    Samantha Pena                        MRN: [id number] Proc. Date: 03/18/99 Adm. Date:  [id number] Attending:  Minette Headland                           Operative Report  PREOPERATIVE DIAGNOSIS:       Intrauterine pregnancy at term.  Placenta previa.  POSTOPERATIVE DIAGNOSIS:      Intrauterine pregnancy at term.  Placenta previa.  OPERATION:                    Primary low transverse cesarean section with delivery of a viable female infant, Apgars of 8 and 9, cord pH 7.37.  SURGEON:                      Freddy Finner, M.D.  ASSISTANT:                    Juluis Mire, M.D.  ANESTHESIA:                   Epidural.  COMPLICATIONS:                None.  ESTIMATED BLOOD LOSS:  INDICATIONS:                  The patient is a 45 year old, gravida 2, para 1, ho had an episode of spotting a few days prior to this admission.  By earlier ultrasounds there was an appearance of a placenta previa.  On the day of this surgery, a repeat ultrasound was obtained using colorflow Doppler here at the hospital which again confirmed placenta previa.  This obviously is an atypical presentation for previa since there was essentially no bleeding to 40 weeks of gestation.  Intraoperatively, the finding was of a placenta that essentially wrapped around the lower uterine segment just above the cervical os with major portion of the placenta posteriorly and only a small piece of placenta wrapping  around anterior.  The os itself did not appear to be covered on closer inspection.  DESCRIPTION OF PROCEDURE:     The patient was admitted to the hospital and brought to the operating room and placed under adequate epidural anesthesia and placed n the dorsal recumbant position with elevation of the right hip by 15 degrees. The lower abdomen was prepped in the usual fashion.  Foley catheter was placed using sterile technique.  Sterile drapes  were applied.  The lower abdominal transverse incision was made and carried sharply down to the fascia which was entered sharply and extended to the extent of the skin incision.  The rectus sheath was developed superiorly and inferiorly with blunt and sharp dissection.  The rectus muscles ere divided in the midline.  The peritoneum was elevated, entered sharply, and extended bluntly and sharply to the extent of the skin incision.  The bladder blade was placed.  A transverse incision was made in the visceroperitoneum overlying the lower uterine segment.  The bladder was bluntly dissected off of the lower segment. A transverse incision was made.  This was extended bluntly in a transverse direction.  A small placental cotyledon was entered in the process, but this was the very thin portion which had wrapped anteriorly around the cervix as noted  above.  The fluid was completely clear.  A viable female infant was then delivered without difficulty.  Apgars and pH are noted above.  Cord blood was obtained for gases and for routine.  Placenta was removed from the uterus and bimanual exploration of the uterus confirmed complete evacuation of products of conception. Edges of the uterine incision were grasped with ring forceps.  The incision was  closed in a single layer with running locking 0 Monocryl for the first layer. he bladder flap was reapproximated with a running 0 Monocryl.  Irrigation was carried out.  Tubes and ovaries were inspected and found to be normal.  The uterus was palpably normal.  Abdominal incision was closed in layers.  Running 0 Monocryl as used to close the peritoneum and to reapproximate the rectus muscles.  The fascia was closed with running 0 PDS.  The skin was closed with wide skin staples and /4 inch Steri-Strips.  The patient tolerated the procedure well and was taken to the recovery room in good condition. DD:  03/18/99 TD:  03/20/99 Job:  [id number]

## 2010-10-19 ENCOUNTER — Other Ambulatory Visit: Payer: Self-pay | Admitting: Obstetrics and Gynecology

## 2010-10-19 DIAGNOSIS — R928 Other abnormal and inconclusive findings on diagnostic imaging of breast: Secondary | ICD-10-CM

## 2010-10-26 ENCOUNTER — Ambulatory Visit
Admission: RE | Admit: 2010-10-26 | Discharge: 2010-10-26 | Disposition: A | Discharge status: Home / Self Care | Payer: 59 | Source: Ambulatory Visit | Attending: Obstetrics and Gynecology | Admitting: Obstetrics and Gynecology

## 2010-10-26 DIAGNOSIS — R928 Other abnormal and inconclusive findings on diagnostic imaging of breast: Secondary | ICD-10-CM

## 2010-12-21 ENCOUNTER — Other Ambulatory Visit: Payer: Self-pay

## 2011-01-30 DIAGNOSIS — IMO0002 Reserved for concepts with insufficient information to code with codable children: Secondary | ICD-10-CM

## 2011-01-30 HISTORY — DX: Reserved for concepts with insufficient information to code with codable children: IMO0002

## 2011-02-07 ENCOUNTER — Ambulatory Visit (INDEPENDENT_AMBULATORY_CARE_PROVIDER_SITE_OTHER): Payer: 59 | Admitting: Internal Medicine

## 2011-02-07 DIAGNOSIS — Z23 Encounter for immunization: Secondary | ICD-10-CM

## 2011-02-14 ENCOUNTER — Encounter (INDEPENDENT_AMBULATORY_CARE_PROVIDER_SITE_OTHER): Payer: Self-pay | Admitting: General Surgery

## 2011-02-14 ENCOUNTER — Ambulatory Visit (INDEPENDENT_AMBULATORY_CARE_PROVIDER_SITE_OTHER): Payer: 59 | Admitting: General Surgery

## 2011-02-14 VITALS — BP 116/74 | HR 60 | Temp 97.4°F | Resp 20 | Ht 66.0 in | Wt 139.1 lb

## 2011-02-14 DIAGNOSIS — D129 Benign neoplasm of anus and anal canal: Secondary | ICD-10-CM

## 2011-02-14 NOTE — Patient Instructions (Signed)
Please call our office if he would like to schedule the procedure that we discussed.

## 2011-02-14 NOTE — Progress Notes (Signed)
Chief Complaint  Patient presents with  . Other    new pt eval of cyst near rectum area     HPI Samantha Pena is a 45 y.o. female.   HPI She is referred by Dr. Candice Camp for evaluation of a lump near the rectal area. She discovered this back in the spring. She saw Dr. Rana Snare at that time. He recommended she have this evaluated. She presents for that now. There is discomfort associated with this area when she sits a certain way or if she has multiple bowel movements in a day. No bleeding. No trauma to the area. The nodule has increased in size. Past Medical History  Diagnosis Date  . Migraines   . Cyst October 2012    near rectum that gives pt discomfort when sitting and has grown in size in the past year     Past Surgical History  Procedure Date  . Cesarean section 03/18/1999  . Foot surgery 09/2002    bone in toe had to be shaved and realigned     Family History  Problem Relation Age of Onset  . Cancer Mother 10    breast cancer     Social History History  Substance Use Topics  . Smoking status: Never Smoker   . Smokeless tobacco: Never Used  . Alcohol Use: Yes    No Known Allergies  Current Outpatient Prescriptions  Medication Sig Dispense Refill  . Calcium Carbonate (CHEWABLE CALCIUM PO) Take by mouth daily.        . Ibuprofen (MOTRIN PO) Take by mouth as needed.        . Multiple Vitamins-Minerals (MULTI FOR HER PO) Take by mouth daily.        . SUMAtriptan (IMITREX) 20 MG/ACT nasal spray Place 1 spray into the nose every 2 (two) hours as needed.          Review of Systems Review of Systems  Gastrointestinal:       Intermittently painful anal nodule.  No constipation.    Blood pressure 116/74, pulse 60, temperature 97.4 F (36.3 C), resp. rate 20, height 5\' 6"  (1.676 m), weight 139 lb 2 oz (63.107 kg).  Physical Exam Physical Exam  Constitutional: She appears well-developed and well-nourished. No distress.  Genitourinary:       8 mm firm nodule at the  2:00 position of the anus with no surrounding erythema. No anal fissure. Digital rectal exam demonstrates no masses or no blood.    Data Reviewed  Notes from Dr. Vance Gather office. Assessment    Enlarging right-sided anal nodule. Does not appear to be an external hemorrhoid. It is symptomatic.    Plan    I recommend removal of the nodule in the outpatient surgical setting. This would define the process and have a good chance of resolving it. I have discussed this with her. We have gone over the procedure and risks and aftercare. The risks include but aren't limited to bleeding, wound healing problems, infection, anesthesia.  She seems to understand all this. She would like to go home and think about it and call us back if she like to schedule the procedure.       [name] 02/14/2011, 9:35 AM

## 2011-03-27 ENCOUNTER — Telehealth: Payer: Self-pay | Admitting: Internal Medicine

## 2011-03-27 MED ORDER — SUMATRIPTAN 20 MG/ACT NA SOLN: 1.0000 | NASAL | Status: DC | PRN

## 2011-03-27 NOTE — Telephone Encounter (Signed)
done

## 2011-03-27 NOTE — Telephone Encounter (Signed)
Pt called req to get a new script written for 90 day supply SUMAtriptan (IMITREX) 20 MG/ACT nasal spray to [facility] in [place].

## 2011-04-03 ENCOUNTER — Telehealth: Payer: Self-pay | Admitting: Internal Medicine

## 2011-04-03 NOTE — Telephone Encounter (Signed)
Called in.

## 2011-04-03 NOTE — Telephone Encounter (Signed)
Pt contacted pharmacy about refill, pharmacy stated they did not receive rx, pt requesting SUMAtriptan (IMITREX) 20 MG/ACT nasal spray be resent to [facility]

## 2011-07-14 ENCOUNTER — Other Ambulatory Visit: Payer: Self-pay | Admitting: *Deleted

## 2011-08-10 ENCOUNTER — Other Ambulatory Visit: Payer: Self-pay | Admitting: Obstetrics and Gynecology

## 2011-08-10 DIAGNOSIS — Z1231 Encounter for screening mammogram for malignant neoplasm of breast: Secondary | ICD-10-CM

## 2011-10-23 ENCOUNTER — Ambulatory Visit
Admission: RE | Admit: 2011-10-23 | Discharge: 2011-10-23 | Disposition: A | Discharge status: Home / Self Care | Payer: Self-pay | Source: Ambulatory Visit | Attending: Obstetrics and Gynecology | Admitting: Obstetrics and Gynecology

## 2011-10-23 DIAGNOSIS — Z1231 Encounter for screening mammogram for malignant neoplasm of breast: Secondary | ICD-10-CM

## 2012-03-18 ENCOUNTER — Encounter (INDEPENDENT_AMBULATORY_CARE_PROVIDER_SITE_OTHER): Payer: 59 | Admitting: General Surgery

## 2012-03-21 ENCOUNTER — Ambulatory Visit (INDEPENDENT_AMBULATORY_CARE_PROVIDER_SITE_OTHER): Payer: 59 | Admitting: General Surgery

## 2012-03-21 ENCOUNTER — Encounter (INDEPENDENT_AMBULATORY_CARE_PROVIDER_SITE_OTHER): Payer: Self-pay | Admitting: General Surgery

## 2012-03-21 VITALS — BP 116/62 | HR 80 | Temp 97.6°F | Resp 16 | Ht 66.0 in | Wt 144.8 lb

## 2012-03-21 DIAGNOSIS — D129 Benign neoplasm of anus and anal canal: Secondary | ICD-10-CM

## 2012-03-21 NOTE — Patient Instructions (Signed)
You have a small, approximately 8 mm benign neoplasm of the anoderm.  I suspect this is a benign fibroma or similar type of slow-growing noncancerous tumor.  We have talked about techniques and excision under local anesthesia as well as intravenous sedation.  You  will be tentatively scheduled for this surgery under sedation. Please call us back if you change your  mind about the anesthesia.

## 2012-03-21 NOTE — Progress Notes (Signed)
Patient ID: Samantha Pena, female   DOB: February 25, 1966, 46 y.o.   MRN: [id number]  Chief Complaint  Patient presents with  . Rectal Problems    eval rectal nodule    HPI Samantha Pena is a 46 y.o. female.  She returns to our office for further discussion about the nodule on her anoderm. She saw Dr. Armanda Heritage  on 02/14/2011. He described an 8 mm firm nodule at the 2:00 position, no fissure, somewhat tender.  She was going  to have this removed but decided against it. She states that she thinks it is a little bigger. It gets tender. There is no bleeding.  She has really no other medical problems. She's had 2 pregnancies, 2 deliveries, one C-section, and she has migraine headaches. HPI  Past Medical History  Diagnosis Date  . Migraines   . Cyst October 2012    near rectum that gives pt discomfort when sitting and has grown in size in the past year     Past Surgical History  Procedure Date  . Cesarean section 03/18/1999  . Foot surgery 09/2002    bone in toe had to be shaved and realigned     Family History  Problem Relation Age of Onset  . Cancer Mother 73    breast cancer     Social History History  Substance Use Topics  . Smoking status: Never Smoker   . Smokeless tobacco: Never Used  . Alcohol Use: Yes    No Known Allergies  Current Outpatient Prescriptions  Medication Sig Dispense Refill  . Calcium Carbonate (CHEWABLE CALCIUM PO) Take by mouth daily.        . Ibuprofen (MOTRIN PO) Take by mouth as needed.        . Multiple Vitamins-Minerals (MULTI FOR HER PO) Take by mouth daily.        . SUMAtriptan (IMITREX) 20 MG/ACT nasal spray Place 1 spray (20 mg total) into the nose every 2 (two) hours as needed.  12 Inhaler  3    Review of Systems Review of Systems  Constitutional: Negative for fever, chills and unexpected weight change.  HENT: Negative for hearing loss, congestion, sore throat, trouble swallowing and voice change.   Eyes: Negative for visual disturbance.    Respiratory: Negative for cough and wheezing.   Cardiovascular: Negative for chest pain, palpitations and leg swelling.  Gastrointestinal: Positive for rectal pain. Negative for nausea, vomiting, abdominal pain, diarrhea, constipation, blood in stool, abdominal distention and anal bleeding.  Genitourinary: Negative for hematuria, vaginal bleeding and difficulty urinating.  Musculoskeletal: Negative for arthralgias.  Skin: Negative for rash and wound.  Neurological: Negative for seizures, syncope and headaches.  Hematological: Negative for adenopathy. Does not bruise/bleed easily.  Psychiatric/Behavioral: Negative for confusion.    Blood pressure 116/62, pulse 80, temperature 97.6 F (36.4 C), temperature source Temporal, resp. rate 16, height 5\' 6"  (1.676 m), weight 144 lb 12.8 oz (65.681 kg).  Physical Exam Physical Exam  Constitutional: She is oriented to person, place, and time. She appears well-developed and well-nourished. No distress.  HENT:  Head: Normocephalic and atraumatic.  Neck: Neck supple. No JVD present. No tracheal deviation present. No thyromegaly present.  Cardiovascular: Normal rate, regular rhythm, normal heart sounds and intact distal pulses.   No murmur heard. Pulmonary/Chest: Effort normal and breath sounds normal. No respiratory distress. She has no wheezes. She has no rales. She exhibits no tenderness.  Abdominal: Soft. Bowel sounds are normal. She exhibits no distension and no  mass. There is no tenderness. There is no rebound and no guarding.       Pfannenstiel scar, well healed.  Genitourinary:       There is an 8 mm firm, well-circumscribed, raised, fibrotic nodule of the anoderm, left anterior. No other skin disorder. Digital rectal exam reveals normal sphincter tone, minimal discomfort. No fissure. No mass of the anal canal.  Musculoskeletal: She exhibits no edema and no tenderness.  Lymphadenopathy:    She has no cervical adenopathy.  Neurological: She  is alert and oriented to person, place, and time. She exhibits normal muscle tone. Coordination normal.  Skin: Skin is warm. No rash noted. She is not diaphoretic. No erythema. No pallor.  Psychiatric: She has a normal mood and affect. Her behavior is normal. Judgment and thought content normal.    Data Reviewed Old records  Assessment    Benign neoplasm of anoderm, left anterior. Suspect this 8 mm nodule is a fibroma or some similar histology. Very low risk for malignancy    Plan    We spent a very long time talking about what would be involved in removing this. We talked about removing a local anesthesia as well as with intravenous sedation. She had lots of questions and was unsure about whether she wanted to do this or not. At the end of the conversation she decided she would like to go ahead and tentatively schedule this for excision under IV sedation. She states that she may change her mind and decided to have it done under local anesthesia. She is a little bit fearful of anesthesia.  I discussed the indications, details, technique, and numerous risks of this procedure with her. She understands these issues. Questions were answered. She agrees with this plan.       Angelia Mould. Derrell Lolling, M.D., [facility] General and Minimally invasive Surgery Breast and Colorectal Surgery Office:   [phone] Pager:   [phone]  03/21/2012, 10:25 AM

## 2012-04-15 ENCOUNTER — Other Ambulatory Visit: Payer: Self-pay | Admitting: Internal Medicine

## 2012-06-26 ENCOUNTER — Other Ambulatory Visit: Payer: Self-pay | Admitting: Obstetrics and Gynecology

## 2012-09-05 ENCOUNTER — Other Ambulatory Visit: Payer: Self-pay | Admitting: Obstetrics and Gynecology

## 2012-09-05 DIAGNOSIS — N6320 Unspecified lump in the left breast, unspecified quadrant: Secondary | ICD-10-CM

## 2012-09-17 ENCOUNTER — Telehealth: Payer: Self-pay | Admitting: *Deleted

## 2012-09-17 ENCOUNTER — Ambulatory Visit
Admission: RE | Admit: 2012-09-17 | Discharge: 2012-09-17 | Disposition: A | Discharge status: Home / Self Care | Payer: BC Managed Care – PPO | Source: Ambulatory Visit | Attending: Obstetrics and Gynecology | Admitting: Obstetrics and Gynecology

## 2012-09-17 ENCOUNTER — Other Ambulatory Visit: Payer: Self-pay | Admitting: Obstetrics and Gynecology

## 2012-09-17 DIAGNOSIS — N6320 Unspecified lump in the left breast, unspecified quadrant: Secondary | ICD-10-CM

## 2012-09-17 NOTE — Telephone Encounter (Signed)
Confirmed 11/18/12 genetic appt w/ pt.  Called Clydie Braun at referring to make her aware.  Took paperwork to Med [facility] to scan.

## 2012-10-15 ENCOUNTER — Telehealth: Payer: Self-pay | Admitting: Genetic Counselor

## 2012-10-15 NOTE — Telephone Encounter (Signed)
Samantha Pena left a VM message stating that she wanted to cancel her genetics appointment.

## 2012-10-18 ENCOUNTER — Other Ambulatory Visit: Payer: Self-pay | Admitting: Internal Medicine

## 2012-10-23 ENCOUNTER — Ambulatory Visit: Payer: Self-pay

## 2012-11-18 ENCOUNTER — Encounter: Payer: BC Managed Care – PPO | Admitting: Genetic Counselor

## 2012-11-18 ENCOUNTER — Other Ambulatory Visit: Payer: BC Managed Care – PPO | Admitting: Lab

## 2013-07-28 ENCOUNTER — Other Ambulatory Visit: Payer: Self-pay

## 2013-07-28 DIAGNOSIS — Z1231 Encounter for screening mammogram for malignant neoplasm of breast: Secondary | ICD-10-CM

## 2013-09-18 ENCOUNTER — Ambulatory Visit
Admission: RE | Admit: 2013-09-18 | Discharge: 2013-09-18 | Disposition: A | Discharge status: Home / Self Care | Payer: BC Managed Care – PPO | Source: Ambulatory Visit

## 2013-09-18 ENCOUNTER — Encounter (INDEPENDENT_AMBULATORY_CARE_PROVIDER_SITE_OTHER): Payer: Self-pay

## 2013-09-18 DIAGNOSIS — Z1231 Encounter for screening mammogram for malignant neoplasm of breast: Secondary | ICD-10-CM

## 2013-09-30 ENCOUNTER — Encounter: Payer: Self-pay | Admitting: Family Medicine

## 2013-09-30 ENCOUNTER — Ambulatory Visit (INDEPENDENT_AMBULATORY_CARE_PROVIDER_SITE_OTHER): Payer: BC Managed Care – PPO | Admitting: Family Medicine

## 2013-09-30 VITALS — BP 100/78 | HR 74 | Temp 99.0°F | Ht 66.0 in | Wt 148.0 lb

## 2013-09-30 DIAGNOSIS — L255 Unspecified contact dermatitis due to plants, except food: Secondary | ICD-10-CM

## 2013-09-30 MED ORDER — PREDNISONE 10 MG PO TABS: ORAL_TABLET | ORAL | Status: DC

## 2013-09-30 NOTE — Progress Notes (Signed)
Pre visit review using our clinic review tool, if applicable. No additional management support is needed unless otherwise documented below in the visit note. 

## 2013-09-30 NOTE — Progress Notes (Signed)
No chief complaint on file.   HPI:  Acute visit for:  1)Poison Ivy: -started sick days after weed whacking -worsening and spreading vesicular itchy rash on bothe entire legs -non on face or in eyes or mouth  -no trouble    ROS: See pertinent positives and negatives per HPI.  Past Medical History  Diagnosis Date  . Migraines   . Cyst October 2012    near rectum that gives pt discomfort when sitting and has grown in size in the past year     Past Surgical History  Procedure Laterality Date  . Cesarean section  03/18/1999  . Foot surgery  09/2002    bone in toe had to be shaved and realigned     Family History  Problem Relation Age of Onset  . Cancer Mother 24    breast cancer     History   Social History  . Marital Status: Married    Spouse Name: N/A    Number of Children: N/A  . Years of Education: N/A   Social History Main Topics  . Smoking status: Never Smoker   . Smokeless tobacco: Never Used  . Alcohol Use: Yes  . Drug Use: No  . Sexual Activity:    Other Topics Concern  . None   Social History Narrative  . None    Current outpatient prescriptions:Calcium Carbonate (CHEWABLE CALCIUM PO), Take by mouth daily.  , Disp: , Rfl: ;  Ibuprofen (MOTRIN PO), Take by mouth as needed.  , Disp: , Rfl: ;  Multiple Vitamins-Minerals (MULTI FOR HER PO), Take by mouth daily.  , Disp: , Rfl: ;  SUMAtriptan (IMITREX) 20 MG/ACT nasal spray, USE 1 SPRAY INTO NOSE EVERY 2 HOURS AS DIRECTED AS NEEDED, Disp: 1 Inhaler, Rfl: 6 predniSONE (DELTASONE) 10 MG tablet, 40mg  (4 tablets) daily for 4 days, then 30mg (3 tablets) daily for 4 days, then 20mg  (2 tablets) daily for 4 days, 10mg  (1 tablet) daily for 4 days, Disp: 40 tablet, Rfl: 0  EXAM:  Filed Vitals:   09/30/13 1343  BP: 100/78  Pulse: 74  Temp: 99 F (37.2 C)    Body mass index is 23.9 kg/(m^2).  GENERAL: vitals reviewed and listed above, alert, oriented, appears well hydrated and in no acute distress  HEENT:  atraumatic, conjunttiva clear, no obvious abnormalities on inspection of external nose and ears  NECK: no obvious masses on inspection  SKIN: vesiculopapular rash scattered on both legs  MS: moves all extremities without noticeable abnormality  PSYCH: pleasant and cooperative, no obvious depression or anxiety  ASSESSMENT AND PLAN:  Discussed the following assessment and plan:  Toxicodendron dermatitis - Plan: predniSONE (DELTASONE) 10 MG tablet  -discussed options and risks and she opted for prednisone taper  -Patient advised to return or notify a doctor immediately if symptoms worsen or persist or new concerns arise.  There are no Patient Instructions on file for this visit.   Samantha Pena

## 2013-09-30 NOTE — Patient Instructions (Signed)
Poison Ivy Poison ivy is a inflammation of the skin (contact dermatitis) caused by touching the allergens on the leaves of the ivy plant following previous exposure to the plant. The rash usually appears 48 hours after exposure. The rash is usually bumps (papules) or blisters (vesicles) in a linear pattern. Depending on your own sensitivity, the rash may simply cause redness and itching, or it may also progress to blisters which may break open. These must be well cared for to prevent secondary bacterial (germ) infection, followed by scarring. Keep any open areas dry, clean, dressed, and covered with an antibacterial ointment if needed. The eyes may also get puffy. The puffiness is worst in the morning and gets better as the day progresses. This dermatitis usually heals without scarring, within 2 to 3 weeks without treatment. HOME CARE INSTRUCTIONS  Thoroughly wash with soap and water as soon as you have been exposed to poison ivy. You have about one half hour to remove the plant resin before it will cause the rash. This washing will destroy the oil or antigen on the skin that is causing, or will cause, the rash. Be sure to wash under your fingernails as any plant resin there will continue to spread the rash. Do not rub skin vigorously when washing affected area. Poison ivy cannot spread if no oil from the plant remains on your body. A rash that has progressed to weeping sores will not spread the rash unless you have not washed thoroughly. It is also important to wash any clothes you have been wearing as these may carry active allergens. The rash will return if you wear the unwashed clothing, even several days later. Avoidance of the plant in the future is the best measure. Poison ivy plant can be recognized by the number of leaves. Generally, poison ivy has three leaves with flowering branches on a single stem. Diphenhydramine may be purchased over the counter and used as needed for itching. Do not drive with  this medication if it makes you drowsy.Ask your caregiver about medication for children. SEEK MEDICAL CARE IF:  Open sores develop.  Redness spreads beyond area of rash.  You notice purulent (pus-like) discharge.  You have increased pain.  Other signs of infection develop (such as fever). Document Released: 04/14/2000 Document Revised: 07/10/2011 Document Reviewed: 03/03/2009 Exit[employer] Patient Information 2014 [employer].  

## 2013-10-15 ENCOUNTER — Other Ambulatory Visit: Payer: Self-pay | Admitting: Obstetrics and Gynecology

## 2013-10-15 DIAGNOSIS — Z803 Family history of malignant neoplasm of breast: Secondary | ICD-10-CM

## 2013-10-15 DIAGNOSIS — R922 Inconclusive mammogram: Secondary | ICD-10-CM

## 2013-10-24 ENCOUNTER — Telehealth: Payer: Self-pay | Admitting: Internal Medicine

## 2013-10-24 MED ORDER — SUMATRIPTAN 20 MG/ACT NA SOLN: NASAL | Status: DC

## 2013-10-24 NOTE — Telephone Encounter (Signed)
Rx sent to pharmacy   

## 2013-10-24 NOTE — Telephone Encounter (Signed)
Pt request refill of SUMAtriptan (IMITREX) 20 MG/ACT nasal spray Pt would like a 90 day and will sch w/ [facility]

## 2014-01-12 ENCOUNTER — Ambulatory Visit (INDEPENDENT_AMBULATORY_CARE_PROVIDER_SITE_OTHER): Payer: BC Managed Care – PPO | Admitting: Family Medicine

## 2014-01-12 DIAGNOSIS — Z23 Encounter for immunization: Secondary | ICD-10-CM

## 2014-01-13 ENCOUNTER — Ambulatory Visit: Payer: BC Managed Care – PPO | Admitting: Family Medicine

## 2014-08-17 ENCOUNTER — Other Ambulatory Visit: Payer: Self-pay

## 2014-08-17 DIAGNOSIS — Z1231 Encounter for screening mammogram for malignant neoplasm of breast: Secondary | ICD-10-CM

## 2014-09-22 ENCOUNTER — Ambulatory Visit: Payer: Self-pay

## 2014-09-23 ENCOUNTER — Ambulatory Visit
Admission: RE | Admit: 2014-09-23 | Discharge: 2014-09-23 | Disposition: A | Discharge status: Home / Self Care | Payer: BLUE CROSS/BLUE SHIELD | Source: Ambulatory Visit

## 2014-09-23 DIAGNOSIS — Z1231 Encounter for screening mammogram for malignant neoplasm of breast: Secondary | ICD-10-CM

## 2014-10-23 LAB — HM PAP SMEAR: HM PAP: NORMAL

## 2014-11-30 ENCOUNTER — Other Ambulatory Visit: Payer: Self-pay | Admitting: *Deleted

## 2014-11-30 ENCOUNTER — Other Ambulatory Visit: Payer: Self-pay | Admitting: Family Medicine

## 2014-12-22 ENCOUNTER — Ambulatory Visit (INDEPENDENT_AMBULATORY_CARE_PROVIDER_SITE_OTHER): Payer: BLUE CROSS/BLUE SHIELD

## 2014-12-22 DIAGNOSIS — Z23 Encounter for immunization: Secondary | ICD-10-CM

## 2015-03-12 ENCOUNTER — Other Ambulatory Visit: Payer: Self-pay | Admitting: *Deleted

## 2015-03-12 MED ORDER — SUMATRIPTAN 20 MG/ACT NA SOLN: NASAL | Status: DC

## 2015-03-17 ENCOUNTER — Encounter: Payer: Self-pay | Admitting: Family Medicine

## 2015-03-17 ENCOUNTER — Ambulatory Visit (INDEPENDENT_AMBULATORY_CARE_PROVIDER_SITE_OTHER): Payer: BLUE CROSS/BLUE SHIELD | Admitting: Family Medicine

## 2015-03-17 VITALS — BP 108/70 | HR 86 | Temp 98.5°F | Wt 156.0 lb

## 2015-03-17 DIAGNOSIS — M26609 Unspecified temporomandibular joint disorder, unspecified side: Secondary | ICD-10-CM | POA: Insufficient documentation

## 2015-03-17 DIAGNOSIS — G43C0 Periodic headache syndromes in child or adult, not intractable: Secondary | ICD-10-CM

## 2015-03-17 DIAGNOSIS — Z0000 Encounter for general adult medical examination without abnormal findings: Secondary | ICD-10-CM | POA: Diagnosis not present

## 2015-03-17 DIAGNOSIS — H04203 Unspecified epiphora, bilateral lacrimal glands: Secondary | ICD-10-CM

## 2015-03-17 MED ORDER — SUMATRIPTAN 20 MG/ACT NA SOLN: NASAL | Status: DC

## 2015-03-17 NOTE — Patient Instructions (Addendum)
Sign release of information at the front desk for pap smears  Refer to optho for watery eyes. Can also trial flonase while waiting for appointment. We will call you within a week about your referral. If you do not hear within 2 weeks, give [place] a call.   Refilled sumatriptan nasal inhaler  Schedule a lab visit at the front desk- Return for future fasting labs within a week. Nothing but water after midnight please.   See me at least yearly

## 2015-03-17 NOTE — Assessment & Plan Note (Signed)
S: Sumatriptan nasal spray. Use varies from 2 to 6 in a month. Triggers around menstruation. No aura.  A/P: refilled sumatriptan 1 year. No changes in pattern of headaches.

## 2015-03-17 NOTE — Progress Notes (Signed)
Samantha Reddish, MD Phone: [phone]  Subjective:  Patient presents today for their annual physical. Chief complaint-noted.   -See problem oriented charting ROS- full  review of systems was completed and negative except for as noted in HPI_ watery eyes.   The following were reviewed and entered/updated in epic: Past Medical History  Diagnosis Date  . Migraines   . Cyst October 2012    2016 stable. near rectum that gives pt discomfort when sitting and has grown in size in the past year    Patient Active Problem List   Diagnosis Date Noted  . Migraine headache 10/30/2006    Priority: Medium  . Anal benign neoplasm 02/14/2011    Priority: Low  . ACTINIC KERATOSIS, CHEEK, LEFT 11/13/2007    Priority: Low   Past Surgical History  Procedure Laterality Date  . Cesarean section  03/18/1999  . Foot surgery  09/2002    bone in toe had to be shaved and realigned     Family History  Problem Relation Age of Onset  . Cancer Mother 43    breast cancer - passed from  . Breast cancer Sister 47  . Diabetes Father     51s  . Hypertension Father   . Epilepsy Sister   . Hyperlipidemia Brother     Medications- reviewed and updated Current Outpatient Prescriptions  Medication Sig Dispense Refill  . Calcium Carbonate (CHEWABLE CALCIUM PO) Take by mouth daily.      . Multiple Vitamins-Minerals (MULTI FOR HER PO) Take by mouth daily.      . Ibuprofen (MOTRIN PO) Take by mouth as needed.      . SUMAtriptan (IMITREX) 20 MG/ACT nasal spray USE 1 SPRAY INTO NOSE EVERY 2 HOURS AS DIRECTED AS NEEDED 18 Inhaler 3   No current facility-administered medications for this visit.    Allergies-reviewed and updated No Known Allergies  Social History   Social History  . Marital Status: Married    Spouse Name: N/A  . Number of Children: N/A  . Years of Education: N/A   Social History Main Topics  . Smoking status: Never Smoker   . Smokeless tobacco: Never Used  . Alcohol Use: 0.0  oz/week    0 Standard drinks or equivalent per week     Comment: rare/social  . Drug Use: No  . Sexual Activity: Not on file   Other Topics Concern  . Not on file   Social History Narrative   Married (mom Donny Pique, husband also patient here). 2 children- girls 19 and 16 in 2016.       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 Oropharynx normal Breast and GYN exam through GYN Neck: no thyromegaly CV: RRR no murmurs rubs or gallops Lungs: CTAB no crackles, wheeze, rhonchi Abdomen: soft/nontender/nondistended/normal bowel sounds. No rebound or guarding.  Ext: no edema Skin: warm, dry Neuro: grossly normal, moves all extremities, PERRLA  Assessment/Plan:  49 y.o. female presenting for annual physical.  Health Maintenance counseling: 1. Anticipatory guidance: Patient counseled regarding regular dental exams, wearing seatbelts.  2. Risk factor reduction:  Advised patient of need for regular exercise and diet rich and fruits and vegetables to reduce risk of heart attack and stroke.  6 lb weight loss over 6 months 3. Immunizations/screenings/ancillary studies Health Maintenance Due  Topic Date Due  . PAP SMEAR - through GYN 09/28/2012  4. Cervical cancer screening- through GYN 5. Breast cancer screening-  breast exam through GYN and mammogram through GYN 6. Colon cancer screening - start at age 30 7. Skin cancer screening- sees dermatology  Dr. Corinna Capra GYN.  Breast center since age 33. Yearly mammograms 3d due to density with mom/sister history  Migraine headache S: Sumatriptan nasal spray. Use varies from 2 to 6 in a month. Triggers around menstruation. No aura.  A/P: refilled sumatriptan 1 year. No changes in pattern of headaches.      Watery eyes S:Eye sight has started to use readers. Feel excessively watery. Far sight is fine. Reading is difficult- ey salways feel watery. Takes allegra D and helps some- can tell if she doesn't take it A/P: possible allergies- will trial flonase in addition to allegra. Even if allergies- not clear why affecting her vision- Refer to optho  1 year CPE Return precautions advised.   Future fasting Orders Placed This Encounter  Procedures  . CBC    [place]    Standing Status: Future     Number of Occurrences:      Standing Expiration Date: 03/16/2016  . Comprehensive metabolic panel    [place]    Standing Status: Future     Number of Occurrences:      Standing Expiration Date: 03/16/2016    Order Specific Question:  Has the patient fasted?    Answer:  No  . Lipid panel    [place]    Standing Status: Future     Number of Occurrences:      Standing Expiration Date: 03/16/2016    Order Specific Question:  Has the patient fasted?    Answer:  No  . TSH        Standing Status: Future     Number of Occurrences:      Standing Expiration Date: 03/16/2016  . Ambulatory referral to Ophthalmology    Referral Priority:  Routine    Referral Type:  Consultation    Referral Reason:  Specialty Services Required    Requested Specialty:  Ophthalmology    Number of Visits Requested:  1  . POCT urinalysis dipstick    Standing Status: Future     Number of Occurrences:      Standing Expiration Date: 03/16/2016    Meds ordered this encounter  Medications  . SUMAtriptan (IMITREX) 20 MG/ACT nasal spray    Sig: USE 1 SPRAY INTO NOSE EVERY 2 HOURS AS DIRECTED AS NEEDED    Dispense:  18 Inhaler    Refill:  3    6 per box. Need 18 total for 3 month supply.

## 2015-03-23 ENCOUNTER — Other Ambulatory Visit (INDEPENDENT_AMBULATORY_CARE_PROVIDER_SITE_OTHER): Payer: BLUE CROSS/BLUE SHIELD

## 2015-03-23 DIAGNOSIS — Z0000 Encounter for general adult medical examination without abnormal findings: Secondary | ICD-10-CM

## 2015-03-23 LAB — COMPREHENSIVE METABOLIC PANEL
ALT: 8 U/L (ref 0–35)
AST: 14 U/L (ref 0–37)
Albumin: 4.4 g/dL (ref 3.5–5.2)
Alkaline Phosphatase: 35 U/L — ABNORMAL LOW (ref 39–117)
BUN: 14 mg/dL (ref 6–23)
CHLORIDE: 103 meq/L (ref 96–112)
CO2: 27 mEq/L (ref 19–32)
CREATININE: 0.85 mg/dL (ref 0.40–1.20)
Calcium: 9.2 mg/dL (ref 8.4–10.5)
GFR: 76.02 mL/min (ref >60.00)
Glucose, Bld: 93 mg/dL (ref 70–99)
Potassium: 4.2 mEq/L (ref 3.5–5.1)
SODIUM: 138 meq/L (ref 135–145)
Total Bilirubin: 0.5 mg/dL (ref 0.2–1.2)
Total Protein: 7.1 g/dL (ref 6.0–8.3)

## 2015-03-23 LAB — POCT URINALYSIS DIPSTICK
Bilirubin, UA: NEGATIVE
Blood, UA: NEGATIVE
Glucose, UA: NEGATIVE
KETONES UA: NEGATIVE
Leukocytes, UA: NEGATIVE
Nitrite, UA: NEGATIVE
SPEC GRAV UA: 1.02
Urobilinogen, UA: 0.2
pH, UA: 8

## 2015-03-23 LAB — CBC
HCT: 40.4 % (ref 36.0–46.0)
Hemoglobin: 13.1 g/dL (ref 12.0–15.0)
MCHC: 32.5 g/dL (ref 30.0–36.0)
MCV: 86.1 fl (ref 78.0–100.0)
Platelets: 264 10*3/uL (ref 150.0–400.0)
RBC: 4.7 Mil/uL (ref 3.87–5.11)
RDW: 14.2 % (ref 11.5–15.5)
WBC: 6 10*3/uL (ref 4.0–10.5)

## 2015-03-23 LAB — LIPID PANEL
CHOL/HDL RATIO: 4
Cholesterol: 182 mg/dL (ref 0–200)
HDL: 43.7 mg/dL (ref >39.00)
LDL CALC: 111 mg/dL — AB (ref 0–99)
NONHDL: 138.45
TRIGLYCERIDES: 139 mg/dL (ref 0.0–149.0)
VLDL: 27.8 mg/dL (ref 0.0–40.0)

## 2015-03-23 LAB — TSH: TSH: 1.12 u[IU]/mL (ref 0.35–4.50)

## 2015-03-24 ENCOUNTER — Encounter: Payer: Self-pay | Admitting: Family Medicine

## 2015-08-02 ENCOUNTER — Other Ambulatory Visit: Payer: Self-pay

## 2015-08-02 DIAGNOSIS — Z1231 Encounter for screening mammogram for malignant neoplasm of breast: Secondary | ICD-10-CM

## 2015-09-24 ENCOUNTER — Ambulatory Visit
Admission: RE | Admit: 2015-09-24 | Discharge: 2015-09-24 | Disposition: A | Discharge status: Home / Self Care | Payer: BLUE CROSS/BLUE SHIELD | Source: Ambulatory Visit

## 2015-09-24 DIAGNOSIS — Z1231 Encounter for screening mammogram for malignant neoplasm of breast: Secondary | ICD-10-CM

## 2015-10-27 ENCOUNTER — Other Ambulatory Visit: Payer: Self-pay | Admitting: Obstetrics and Gynecology

## 2015-10-27 ENCOUNTER — Other Ambulatory Visit: Payer: Self-pay

## 2015-10-27 DIAGNOSIS — N6315 Unspecified lump in the right breast, overlapping quadrants: Secondary | ICD-10-CM

## 2015-10-27 DIAGNOSIS — N6310 Unspecified lump in the right breast, unspecified quadrant: Principal | ICD-10-CM

## 2015-11-01 ENCOUNTER — Other Ambulatory Visit: Payer: BLUE CROSS/BLUE SHIELD

## 2015-11-09 ENCOUNTER — Ambulatory Visit
Admission: RE | Admit: 2015-11-09 | Discharge: 2015-11-09 | Disposition: A | Discharge status: Home / Self Care | Payer: BLUE CROSS/BLUE SHIELD | Source: Ambulatory Visit | Attending: Obstetrics and Gynecology | Admitting: Obstetrics and Gynecology

## 2015-11-09 DIAGNOSIS — N6315 Unspecified lump in the right breast, overlapping quadrants: Secondary | ICD-10-CM

## 2015-11-09 DIAGNOSIS — N6310 Unspecified lump in the right breast, unspecified quadrant: Principal | ICD-10-CM

## 2016-02-23 ENCOUNTER — Telehealth: Payer: Self-pay | Admitting: Family Medicine

## 2016-02-23 MED ORDER — SUMATRIPTAN 20 MG/ACT NA SOLN: NASAL | 1 refills | Status: DC

## 2016-02-23 NOTE — Telephone Encounter (Signed)
Medication sent in for patient and pharmacy changed.

## 2016-02-23 NOTE — Telephone Encounter (Signed)
Pt request refill  SUMAtriptan (IMITREX) 20 MG/ACT nasal spray  Pt has new pharmacy [facility] [address]  Please send all Rx to [facility] from now on, effective 10/29

## 2016-03-01 ENCOUNTER — Ambulatory Visit: Payer: BLUE CROSS/BLUE SHIELD | Admitting: Family Medicine

## 2016-03-01 ENCOUNTER — Ambulatory Visit (INDEPENDENT_AMBULATORY_CARE_PROVIDER_SITE_OTHER): Payer: PRIVATE HEALTH INSURANCE | Admitting: *Deleted

## 2016-03-01 DIAGNOSIS — Z23 Encounter for immunization: Secondary | ICD-10-CM | POA: Diagnosis not present

## 2016-08-03 ENCOUNTER — Other Ambulatory Visit: Payer: Self-pay | Admitting: Obstetrics and Gynecology

## 2016-08-03 DIAGNOSIS — Z1231 Encounter for screening mammogram for malignant neoplasm of breast: Secondary | ICD-10-CM

## 2016-10-01 ENCOUNTER — Other Ambulatory Visit: Payer: Self-pay | Admitting: Family Medicine

## 2016-10-02 ENCOUNTER — Ambulatory Visit
Admission: RE | Admit: 2016-10-02 | Discharge: 2016-10-02 | Disposition: A | Discharge status: Home / Self Care | Payer: PRIVATE HEALTH INSURANCE | Source: Ambulatory Visit | Attending: Obstetrics and Gynecology | Admitting: Obstetrics and Gynecology

## 2016-10-02 DIAGNOSIS — Z1231 Encounter for screening mammogram for malignant neoplasm of breast: Secondary | ICD-10-CM

## 2016-10-02 LAB — HM MAMMOGRAPHY

## 2016-10-02 NOTE — Telephone Encounter (Signed)
May give up to 90 days worth but needs to schedule follow up as well over a year 03/2015 I believe

## 2016-10-04 NOTE — Telephone Encounter (Signed)
Samantha Pena can you call to schedule her and get her a short term refill such as 30 days?

## 2016-10-17 ENCOUNTER — Other Ambulatory Visit: Payer: Self-pay

## 2016-10-17 MED ORDER — SUMATRIPTAN 20 MG/ACT NA SOLN: NASAL | 1 refills | Status: DC

## 2016-10-17 NOTE — Telephone Encounter (Signed)
Called and spoke to patient. I scheduled her for a CPE on 10/31/16. I sent in a refill on her SUMAtriptan (IMITREX) 20 MG/ACT nasal spray.

## 2016-10-31 ENCOUNTER — Encounter: Payer: Self-pay | Admitting: Family Medicine

## 2016-10-31 ENCOUNTER — Ambulatory Visit (INDEPENDENT_AMBULATORY_CARE_PROVIDER_SITE_OTHER): Payer: PRIVATE HEALTH INSURANCE | Admitting: Family Medicine

## 2016-10-31 VITALS — BP 116/80 | HR 87 | Temp 98.9°F | Ht 66.75 in | Wt 168.0 lb

## 2016-10-31 DIAGNOSIS — Z0000 Encounter for general adult medical examination without abnormal findings: Secondary | ICD-10-CM

## 2016-10-31 DIAGNOSIS — J309 Allergic rhinitis, unspecified: Secondary | ICD-10-CM

## 2016-10-31 DIAGNOSIS — E785 Hyperlipidemia, unspecified: Secondary | ICD-10-CM | POA: Diagnosis not present

## 2016-10-31 DIAGNOSIS — Z23 Encounter for immunization: Secondary | ICD-10-CM | POA: Diagnosis not present

## 2016-10-31 DIAGNOSIS — G43909 Migraine, unspecified, not intractable, without status migrainosus: Secondary | ICD-10-CM | POA: Diagnosis not present

## 2016-10-31 MED ORDER — SUMATRIPTAN 20 MG/ACT NA SOLN: NASAL | 3 refills | Status: DC

## 2016-10-31 NOTE — Patient Instructions (Addendum)
Schedule a lab visit at the check out desk within 2 weeks. Return for future fasting labs meaning nothing but water after midnight please. Ok to take your medications with water.   Would trial neti pot or similar sinus rinse  6 month weight check with target at least 10 lbs down by follow up  Consider weight watchers if not making progress with exercise alone within 2 months.   Definitely 1 year for physical

## 2016-10-31 NOTE — Progress Notes (Addendum)
Phone: [phone]  Subjective:  Patient presents today for their annual physical. Chief complaint-noted.   See problem oriented charting- ROS- full  review of systems was completed and negative except for: migraine headaches  The following were reviewed and entered/updated in epic: Past Medical History:  Diagnosis Date  . Cyst October 2012   2016 stable. near rectum that gives pt discomfort when sitting and has grown in size in the past year   . Migraines    Patient Active Problem List   Diagnosis Date Noted  . Migraine headache 10/30/2006    Priority: Medium  . TMJ disease 03/17/2015    Priority: Low  . Anal benign neoplasm 02/14/2011    Priority: Low  . ACTINIC KERATOSIS, CHEEK, LEFT 11/13/2007    Priority: Low  . Hyperlipidemia 10/31/2016   Past Surgical History:  Procedure Laterality Date  . CESAREAN SECTION  03/18/1999  . FOOT SURGERY  09/2002   bone in toe had to be shaved and realigned     Family History  Problem Relation Age of Onset  . Cancer Mother 45       breast cancer - passed from  . Breast cancer Mother 52  . Diabetes Father        15s  . Hypertension Father   . Breast cancer Sister 57  . Epilepsy Sister   . Hyperlipidemia Brother     Medications- reviewed and updated Current Outpatient Prescriptions  Medication Sig Dispense Refill  . Calcium Carbonate (CHEWABLE CALCIUM PO) Take by mouth daily.      . Ibuprofen (MOTRIN PO) Take by mouth as needed.      Marland Kitchen levocetirizine (XYZAL) 5 MG tablet Take 5 mg by mouth every evening.    . montelukast (SINGULAIR) 10 MG tablet Take 10 mg by mouth at bedtime.    . Multiple Vitamins-Minerals (MULTI FOR HER PO) Take by mouth daily.      . SUMAtriptan (IMITREX) 20 MG/ACT nasal spray USE 1 SPRAY INTO NOSE EVERY 2 HOURS AS DIRECTED AS NEEDED 18 Act 3   No current facility-administered medications for this visit.     Allergies-reviewed and updated No Known Allergies  Social History   Social History  .  Marital status: Married    Spouse name: N/A  . Number of children: N/A  . Years of education: N/A   Social History Main Topics  . Smoking status: Never Smoker  . Smokeless tobacco: Never Used  . Alcohol use 0.0 oz/week     Comment: rare/social  . Drug use: No  . Sexual activity: Not Asked   Other Topics Concern  . None   Social History Narrative   Married (mom Donny Pique, husband also patient here). 2 children- girls 19 and 16 in 2016.       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 Oropharynx normal Neck: no thyromegaly CV: RRR no murmurs rubs or gallops Lungs: CTAB no crackles, wheeze, rhonchi Abdomen: soft/nontender/nondistended/normal bowel sounds. No rebound or guarding.  Ext: no edema Skin: warm, dry Neuro:  grossly normal, moves all extremities, PERRLA  Assessment/Plan:  51 y.o. Pena presenting for annual physical.  Health Maintenance counseling: 1. Anticipatory guidance: Patient counseled regarding regular dental exams q6 months, eye exams - 2 years ago, wearing seatbelts.  2. Risk factor reduction:  Advised patient of need for regular exercise and diet rich and fruits and vegetables to reduce risk of heart attack and stroke. Exercise- encourage regular exercise- 150 minutes a week. Diet-discussed healthy habits. Weight up 20 lbs in 3 years.   Wt Readings from Last 3 Encounters:  10/31/16 168 lb (76.2 kg)  03/17/15 156 lb (70.8 kg)  09/30/13 148 lb (67.1 kg)  3. Immunizations/screenings/ancillary studies- up to date after tdap today. 1997- Vaccinated from chicken pox in [place]- never had chicken pox. Not  shingles vaccinate candidate Immunization History  Administered Date(s) Administered  . Influenza Split 02/07/2011  . Influenza Whole 02/12/2007, 02/04/2008, 01/26/2009, 12/27/2009  . Influenza,inj,Quad PF,36+ Mos 01/12/2014, 12/22/2014, 03/01/2016  . Td 05/03/2006  . Tdap 10/31/2016  4. Cervical cancer screening- yearly through GYN Dr. Corinna Capra [facility] 5. Breast cancer screening-  breast exam through GYN and mammogram and does 3d- May 2018- through GYN. Mom and sister with history of breast cancer 6. Colon cancer screening - discussed ACS guidelines change to 45. Prefers to wait until 52.  7. Skin cancer screening- sees Dermatology  Status of chronic or acute concerns   Hyperlipidemia- update labs. Weight gain- also check TSH though suspect this is slow metabolism/lack of eercise.   Migraine- imitrex nasal spray 2-6 x a month usually - unchanged form last visit  Allergic rhinitis- was getting sinus headaches and saw allergist at [facility]- was placed on singulair in addition to xyzal- doing better  Rare incontinence. States has to go frequently for 2 years. Feels incomplete emptying. Sudden urges at times. In car rides cant wait more than 2-3 hours even if pees just before she goes. Discussed OAB. Also has some issues with sneezing/coughing- discussed kegels. She is not interested in meds at present  Return in about 6 months (around 05/03/2017) for follow up- or sooner if needed. cpe 1 year  Orders Placed This Encounter  Procedures  . Tdap vaccine greater than or equal to 7yo IM  . Comprehensive metabolic panel    [place]    Standing Status:   Future    Standing Expiration Date:   10/31/2017  . CBC    Standing Status:   Future    Standing Expiration Date:   10/31/2017  . Lipid panel    Standing Status:   Future    Standing Expiration Date:   10/31/2017  . TSH    Standing Status:   Future    Standing Expiration Date:   10/31/2017    Meds ordered this encounter  Medications  . montelukast  (SINGULAIR) 10 MG tablet    Sig: Take 10 mg by mouth at bedtime.  Marland Kitchen levocetirizine (XYZAL) 5 MG tablet    Sig: Take 5 mg by mouth every evening.  . SUMAtriptan (IMITREX) 20 MG/ACT nasal spray    Sig: USE 1 SPRAY INTO NOSE EVERY 2 HOURS AS DIRECTED AS NEEDED    Dispense:  18 Act    Refill:  3    6 per box. Need 18 total for 3 month supply.   Return precautions advised.  Garret Reddish, MD

## 2016-11-14 LAB — HM PAP SMEAR: HM PAP: NORMAL

## 2016-11-23 ENCOUNTER — Encounter: Payer: Self-pay | Admitting: Family Medicine

## 2016-12-18 ENCOUNTER — Other Ambulatory Visit: Payer: PRIVATE HEALTH INSURANCE

## 2016-12-25 ENCOUNTER — Other Ambulatory Visit (INDEPENDENT_AMBULATORY_CARE_PROVIDER_SITE_OTHER): Payer: PRIVATE HEALTH INSURANCE

## 2016-12-25 DIAGNOSIS — E785 Hyperlipidemia, unspecified: Secondary | ICD-10-CM

## 2016-12-25 DIAGNOSIS — Z0000 Encounter for general adult medical examination without abnormal findings: Secondary | ICD-10-CM | POA: Diagnosis not present

## 2016-12-25 LAB — COMPREHENSIVE METABOLIC PANEL
ALBUMIN: 4.3 g/dL (ref 3.5–5.2)
ALK PHOS: 36 U/L — AB (ref 39–117)
ALT: 6 U/L (ref 0–35)
AST: 14 U/L (ref 0–37)
BILIRUBIN TOTAL: 0.3 mg/dL (ref 0.2–1.2)
BUN: 14 mg/dL (ref 6–23)
CO2: 28 mEq/L (ref 19–32)
Calcium: 9.1 mg/dL (ref 8.4–10.5)
Chloride: 105 mEq/L (ref 96–112)
Creatinine, Ser: 0.86 mg/dL (ref 0.40–1.20)
GFR: 74.45 mL/min (ref >60.00)
GLUCOSE: 106 mg/dL — AB (ref 70–99)
POTASSIUM: 3.8 meq/L (ref 3.5–5.1)
Sodium: 138 mEq/L (ref 135–145)
TOTAL PROTEIN: 6.7 g/dL (ref 6.0–8.3)

## 2016-12-25 LAB — CBC
HCT: 39 % (ref 36.0–46.0)
HEMOGLOBIN: 13.1 g/dL (ref 12.0–15.0)
MCHC: 33.6 g/dL (ref 30.0–36.0)
MCV: 85.4 fl (ref 78.0–100.0)
Platelets: 252 10*3/uL (ref 150.0–400.0)
RBC: 4.56 Mil/uL (ref 3.87–5.11)
RDW: 13.7 % (ref 11.5–15.5)
WBC: 6.9 10*3/uL (ref 4.0–10.5)

## 2016-12-25 LAB — TSH: TSH: 1.17 u[IU]/mL (ref 0.35–4.50)

## 2016-12-25 LAB — LIPID PANEL
Cholesterol: 198 mg/dL (ref 0–200)
HDL: 32.7 mg/dL — ABNORMAL LOW (ref >39.00)
Total CHOL/HDL Ratio: 6

## 2016-12-25 LAB — LDL CHOLESTEROL, DIRECT: LDL DIRECT: 81 mg/dL

## 2016-12-26 ENCOUNTER — Ambulatory Visit (INDEPENDENT_AMBULATORY_CARE_PROVIDER_SITE_OTHER): Payer: PRIVATE HEALTH INSURANCE

## 2016-12-26 ENCOUNTER — Ambulatory Visit: Payer: PRIVATE HEALTH INSURANCE

## 2016-12-26 DIAGNOSIS — Z23 Encounter for immunization: Secondary | ICD-10-CM

## 2017-01-29 ENCOUNTER — Ambulatory Visit (INDEPENDENT_AMBULATORY_CARE_PROVIDER_SITE_OTHER): Payer: PRIVATE HEALTH INSURANCE | Admitting: Podiatry

## 2017-01-29 VITALS — BP 120/80 | HR 75 | Ht 66.0 in | Wt 155.0 lb

## 2017-01-29 DIAGNOSIS — B079 Viral wart, unspecified: Secondary | ICD-10-CM | POA: Diagnosis not present

## 2017-01-29 DIAGNOSIS — M722 Plantar fascial fibromatosis: Secondary | ICD-10-CM | POA: Diagnosis not present

## 2017-01-29 DIAGNOSIS — Q828 Other specified congenital malformations of skin: Secondary | ICD-10-CM | POA: Diagnosis not present

## 2017-01-29 MED ORDER — TRIAMCINOLONE ACETONIDE 10 MG/ML IJ SUSP
10.0000 mg | Freq: Once | INTRAMUSCULAR | Status: AC
  Administered 2017-01-29: 10 mg

## 2017-01-29 NOTE — Progress Notes (Signed)
Subjective:    Patient ID: Samantha Pena, female   DOB: 51 y.o.   MRN: [id number]   HPI patient states a lot of pain on bottom my left foot and I've had a lesion there and it's been there for at least a month and getting very sore. Patient does not smoke    Review of Systems  All other systems reviewed and are negative.       Objective:  Physical Exam  Constitutional: She appears well-developed and well-nourished.  Cardiovascular: Intact distal pulses.   Pulmonary/Chest: Effort normal.  Musculoskeletal: Normal range of motion.  Neurological: She is alert.  Skin: Skin is warm.  Nursing note and vitals reviewed.  neurovascular status intact muscle strength adequate range of motion within normal limits with patient found to have lesion plantar aspect left heel that is painful when pressed and is more painful to direct pressure with inflammation of the entire plantar tendon itself found have good digital perfusion well oriented times     Assessment:   Inflammatory fasciitis left in conjunction with possibility for keratotic lesion formation which may be porokeratotic or may be wart formation      Plan:    H&P x-ray reviewed condition discussed. At this time I did do a plantar fascial injection 3 mg Kenalog 5 g Xylocaine and went ahead did deep debridement of lesion and applied chemical agent with sterile dressing and reappoint to recheck when symptomatic  X-rays indicate no spur with mild diminishment of arch height and no other pathology

## 2017-01-29 NOTE — Progress Notes (Signed)
   Subjective:    Patient ID: Samantha Pena, female    DOB: Jan 19, 1966, 51 y.o.   MRN: [id number]  HPI  Chief Complaint  Patient presents with  . Plantar Warts    L. bottom heel x1 month. Pt. stated,"very painful, shooting sharp pain."     Review of Systems  All other systems reviewed and are negative.      Objective:   Physical Exam        Assessment & Plan:

## 2017-03-01 ENCOUNTER — Other Ambulatory Visit: Payer: Self-pay | Admitting: Radiology

## 2017-03-01 DIAGNOSIS — N6001 Solitary cyst of right breast: Secondary | ICD-10-CM

## 2017-03-06 ENCOUNTER — Ambulatory Visit
Admission: RE | Admit: 2017-03-06 | Discharge: 2017-03-06 | Disposition: A | Discharge status: Home / Self Care | Payer: PRIVATE HEALTH INSURANCE | Source: Ambulatory Visit | Attending: Radiology | Admitting: Radiology

## 2017-03-06 DIAGNOSIS — N6001 Solitary cyst of right breast: Secondary | ICD-10-CM

## 2017-05-07 ENCOUNTER — Ambulatory Visit: Payer: PRIVATE HEALTH INSURANCE | Admitting: Family Medicine

## 2017-05-07 ENCOUNTER — Encounter: Payer: Self-pay | Admitting: Family Medicine

## 2017-05-07 VITALS — BP 108/72 | HR 87 | Temp 98.2°F | Ht 66.0 in | Wt 174.2 lb

## 2017-05-07 DIAGNOSIS — E785 Hyperlipidemia, unspecified: Secondary | ICD-10-CM | POA: Diagnosis not present

## 2017-05-07 DIAGNOSIS — E663 Overweight: Secondary | ICD-10-CM | POA: Diagnosis not present

## 2017-05-07 DIAGNOSIS — J301 Allergic rhinitis due to pollen: Secondary | ICD-10-CM | POA: Diagnosis not present

## 2017-05-07 DIAGNOSIS — J309 Allergic rhinitis, unspecified: Secondary | ICD-10-CM | POA: Insufficient documentation

## 2017-05-07 NOTE — Patient Instructions (Addendum)
Strongly consider weight watchers  Lets set the same goal again- 10 lbs off by next visit  My 5 to Fitness!  5: fruits and vegetables per day (work on 9 per day if you are at 5). More veggies then fruit if possible 4: exercise 4-5 times per week for at least 30 minutes (walking counts!) 3: meals per day (don't skip breakfast!) 0-1: sweet per day (2 cookies, 1 small cup of ice cream)  These are general tips for healthy living. Try to start with 1 or 2 habit TODAY and make it a part of your life for several months.   Once you have 1 or 2 habits down for several months, try to begin working on your next healthy habit. With every single step you take, you will be leading a healthier lifestyle!

## 2017-05-07 NOTE — Assessment & Plan Note (Signed)
S: weight up 6 lbs in 6 months though goal had been 10 lbs down.   Patient lost her mother in law this fall and then the winter months and holidays made healthy eating challenging. She also is having some hormonal changes most likely- Starting to get less regular periods- seeing GYN. She would also like to attribute issues to age, not being able to exercise with work.  Wt Readings from Last 3 Encounters:  05/07/17 174 lb 3.2 oz (79 kg)  01/29/17 155 lb (70.3 kg)  10/31/16 168 lb (76.2 kg)  A/P: we discussed weight up 26 lbs since 09/30/13 and 36 lbs from 2011. I told patient I was concerned about trajectory toward obesity. STRONGLY encouraged weight watchers- she seems to want to try to do this on her own but I am concerned about this given weight gain over last 6 months though I do understand she has had challenges. Advised 10 lbs off by her physical through healthy lifestyle changes and hopefully weight watchers. If she does not reverse this- I am concerned about lipids, glucose, even BP in longrun

## 2017-05-07 NOTE — Assessment & Plan Note (Signed)
S: poorly controlled on no rx. No myalgias.  Lab Results  Component Value Date   CHOL 198 12/25/2016   HDL 32.70 (L) 12/25/2016   LDLCALC 111 (H) 03/23/2015   LDLDIRECT 81.0 12/25/2016   TRIG (H) 12/25/2016    448.0 Triglyceride is over 400; calculations on Lipids are invalid.   CHOLHDL 6 12/25/2016   A/P: we discussed risks of her high cholesterol and importance of weight loss to help control this. 2 years ago before weight gain had triglycerides of 139 and HDL of 43.

## 2017-05-07 NOTE — Assessment & Plan Note (Signed)
S: using singulair now in addition to xyzal and helping with her sinus headaches. Had also discussed adding neti pot. She feels she is doing well and wants to see if she can cut either singulair or xyzal. She has allergy follow up within next month A/P: she asks me about stopping singulair- I advised her I would prefer for allergist to do this but I am very thankful she is feeling better.

## 2017-05-07 NOTE — Progress Notes (Signed)
Subjective:  Samantha Pena is a 52 y.o. year old very pleasant female patient who presents for/with See problem oriented charting ROS- still with headaches, sinus headaches less frequent, has had weight gain. No edema.    Past Medical History-  Patient Active Problem List   Diagnosis Date Noted  . Hyperlipidemia 10/31/2016    Priority: Medium  . Migraine headache 10/30/2006    Priority: Medium  . Overweight 05/07/2017    Priority: Low  . Allergic rhinitis 05/07/2017    Priority: Low  . TMJ disease 03/17/2015    Priority: Low  . Anal benign neoplasm 02/14/2011    Priority: Low  . ACTINIC KERATOSIS, CHEEK, LEFT 11/13/2007    Priority: Low    Medications- reviewed and updated Current Outpatient Medications  Medication Sig Dispense Refill  . Calcium Carbonate (CHEWABLE CALCIUM PO) Take by mouth daily.      . Ibuprofen (MOTRIN PO) Take by mouth as needed.      Marland Kitchen levocetirizine (XYZAL) 5 MG tablet Take 5 mg by mouth every evening.    . montelukast (SINGULAIR) 10 MG tablet Take 10 mg by mouth at bedtime.    . Multiple Vitamins-Minerals (MULTI FOR HER PO) Take by mouth daily.      . SUMAtriptan (IMITREX) 20 MG/ACT nasal spray USE 1 SPRAY INTO NOSE EVERY 2 HOURS AS DIRECTED AS NEEDED 18 Act 3   No current facility-administered medications for this visit.     Objective: BP 108/72 (BP Location: Left Arm, Patient Position: Sitting, Cuff Size: Large)   Pulse 87   Temp 98.2 F (36.8 C) (Oral)   Ht 5\' 6"  (1.676 m)   Wt 174 lb 3.2 oz (79 kg)   LMP 05/03/2017   SpO2 99%   BMI 28.12 kg/m  Gen: NAD, resting comfortably CV: RRR no murmurs rubs or gallops Lungs: CTAB no crackles, wheeze, rhonchi Ext: no edema Skin: warm, dry  Assessment/Plan:  Other issues 1. Not discussed today- had declined meds for OAB last visit. We discussed kegels. Had frequency, incomplete emptying, sudden urge for 2 years with normal urine 2. Previously- imitrex 2-6x a month nasal  spray  Hyperlipidemia S: poorly controlled on no rx. No myalgias.  Lab Results  Component Value Date   CHOL 198 12/25/2016   HDL 32.70 (L) 12/25/2016   LDLCALC 111 (H) 03/23/2015   LDLDIRECT 81.0 12/25/2016   TRIG (H) 12/25/2016    448.0 Triglyceride is over 400; calculations on Lipids are invalid.   CHOLHDL 6 12/25/2016   A/P: we discussed risks of her high cholesterol and importance of weight loss to help control this. 2 years ago before weight gain had triglycerides of 139 and HDL of 43.   Overweight S: weight up 6 lbs in 6 months though goal had been 10 lbs down.   Patient lost her mother in law this fall and then the winter months and holidays made healthy eating challenging. She also is having some hormonal changes most likely- Starting to get less regular periods- seeing GYN. She would also like to attribute issues to age, not being able to exercise with work.  Wt Readings from Last 3 Encounters:  05/07/17 174 lb 3.2 oz (79 kg)  01/29/17 155 lb (70.3 kg)  10/31/16 168 lb (76.2 kg)  A/P: we discussed weight up 26 lbs since 09/30/13 and 36 lbs from 2011. I told patient I was concerned about trajectory toward obesity. STRONGLY encouraged weight watchers- she seems to want to try to do  this on her own but I am concerned about this given weight gain over last 6 months though I do understand she has had challenges. Advised 10 lbs off by her physical through healthy lifestyle changes and hopefully weight watchers. If she does not reverse this- I am concerned about lipids, glucose, even BP in longrun  Allergic rhinitis S: using singulair now in addition to xyzal and helping with her sinus headaches. Had also discussed adding neti pot. She feels she is doing well and wants to see if she can cut either singulair or xyzal. She has allergy follow up within next month A/P: she asks me about stopping singulair- I advised her I would prefer for allergist to do this but I am very thankful she is  feeling better.    Future Appointments  Date Time Provider [place]  11/12/2017  9:30 AM Marin Olp, MD [facility]   Return in about 6 months (around 11/04/2017) for physical.  Return precautions advised.  Garret Reddish, MD

## 2017-07-23 ENCOUNTER — Encounter: Payer: Self-pay | Admitting: Physician Assistant

## 2017-07-23 ENCOUNTER — Ambulatory Visit: Payer: PRIVATE HEALTH INSURANCE | Admitting: Physician Assistant

## 2017-07-23 VITALS — BP 110/72 | HR 80 | Temp 98.4°F | Ht 66.0 in | Wt 168.5 lb

## 2017-07-23 DIAGNOSIS — H6690 Otitis media, unspecified, unspecified ear: Secondary | ICD-10-CM

## 2017-07-23 DIAGNOSIS — J321 Chronic frontal sinusitis: Secondary | ICD-10-CM | POA: Diagnosis not present

## 2017-07-23 DIAGNOSIS — H6123 Impacted cerumen, bilateral: Secondary | ICD-10-CM | POA: Diagnosis not present

## 2017-07-23 MED ORDER — FLUTICASONE PROPIONATE 50 MCG/ACT NA SUSP: 2.0000 | Freq: Every day | NASAL | 2 refills | Status: DC

## 2017-07-23 MED ORDER — AMOXICILLIN-POT CLAVULANATE 875-125 MG PO TABS: 1.0000 | ORAL_TABLET | Freq: Two times a day (BID) | ORAL | 0 refills | Status: DC

## 2017-07-23 NOTE — Progress Notes (Signed)
Samantha Pena is a 52 y.o. female here for a new problem.  I acted as a Education administrator for [employer], PA-C Anselmo Pickler, LPN  History of Present Illness:   Chief Complaint  Patient presents with  . Left ear pain    Otalgia   There is pain in the left ear. This is a new problem. Episode onset: Started week before with sinus issue and now ear has been clogged since Thursday, was traveling last week came home Thursday. The problem occurs constantly. The problem has been unchanged. There has been no fever. The pain is at a severity of 8/10. The pain is moderate. Pertinent negatives include no coughing, ear discharge or sore throat. Associated symptoms comments: Having some dizziness. She has tried ear drops (cleaned her left ear out, decongestant) for the symptoms. The treatment provided no relief.   She has had a total of 2 weeks of sinus congestion. Appetite fair. Pushing fluids.   Past Medical History:  Diagnosis Date  . Cyst October 2012   2016 stable. near rectum that gives pt discomfort when sitting and has grown in size in the past year   . Migraines      Social History   Socioeconomic History  . Marital status: Married    Spouse name: Not on file  . Number of children: Not on file  . Years of education: Not on file  . Highest education level: Not on file  Occupational History  . Not on file  Social Needs  . Financial resource strain: Not on file  . Food insecurity:    Worry: Not on file    Inability: Not on file  . Transportation needs:    Medical: Not on file    Non-medical: Not on file  Tobacco Use  . Smoking status: Never Smoker  . Smokeless tobacco: Never Used  Substance and Sexual Activity  . Alcohol use: Yes    Alcohol/week: 0.0 oz    Comment: rare/social  . Drug use: No  . Sexual activity: Not on file  Lifestyle  . Physical activity:    Days per week: Not on file    Minutes per session: Not on file  . Stress: Not on file  Relationships  . Social  connections:    Talks on phone: Not on file    Gets together: Not on file    Attends religious service: Not on file    Active member of club or organization: Not on file    Attends meetings of clubs or organizations: Not on file    Relationship status: Not on file  . Intimate partner violence:    Fear of current or ex partner: Not on file    Emotionally abused: Not on file    Physically abused: Not on file    Forced sexual activity: Not on file  Other Topics Concern  . Not on file  Social History Narrative   Married (mom Donny Pique, husband also patient here). 2 children- girls 19 and 16 in 2016.       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 CESAREAN SECTION  03/18/1999  . FOOT SURGERY  09/2002   bone in toe had to be shaved and realigned     Family History  Problem Relation Age of Onset  . Cancer Mother 6  breast cancer - passed from  . Breast cancer Mother 58  . Diabetes Father        74s  . Hypertension Father   . Breast cancer Sister 76  . Epilepsy Sister   . Hyperlipidemia Brother     No Known Allergies  Current Medications:   Current Outpatient Medications:  .  Calcium Carbonate (CHEWABLE CALCIUM PO), Take by mouth daily.  , Disp: , Rfl:  .  Ibuprofen (MOTRIN PO), Take by mouth as needed.  , Disp: , Rfl:  .  levocetirizine (XYZAL) 5 MG tablet, Take 5 mg by mouth every evening., Disp: , Rfl:  .  montelukast (SINGULAIR) 10 MG tablet, Take 10 mg by mouth at bedtime., Disp: , Rfl:  .  Multiple Vitamins-Minerals (MULTI FOR HER PO), Take by mouth daily.  , Disp: , Rfl:  .  SUMAtriptan (IMITREX) 20 MG/ACT nasal spray, USE 1 SPRAY INTO NOSE EVERY 2 HOURS AS DIRECTED AS NEEDED, Disp: 18 Act, Rfl: 3 .  amoxicillin-clavulanate (AUGMENTIN) 875-125 MG tablet, Take 1 tablet by mouth 2 (two) times daily., Disp: 20 tablet, Rfl: 0 .  fluticasone  (FLONASE) 50 MCG/ACT nasal spray, Place 2 sprays into both nostrils daily., Disp: 16 g, Rfl: 2   Review of Systems:   Review of Systems  HENT: Positive for ear pain. Negative for ear discharge and sore throat.   Respiratory: Negative for cough.     Vitals:   Vitals:   07/23/17 1302  BP: 110/72  Pulse: 80  Temp: 98.4 F (36.9 C)  TempSrc: Oral  SpO2: 97%  Weight: 168 lb 8 oz (76.4 kg)  Height: 5\' 6"  (1.676 m)     Body mass index is 27.2 kg/m.  Physical Exam:   Physical Exam  Constitutional: She appears well-developed. She is cooperative.  Non-toxic appearance. She does not have a sickly appearance. She does not appear ill. No distress.  HENT:  Head: Normocephalic and atraumatic.  Right Ear: Tympanic membrane, external ear and ear canal normal. Tympanic membrane is not erythematous, not retracted and not bulging.  Left Ear: External ear and ear canal normal. Tympanic membrane is erythematous. Tympanic membrane is not retracted and not bulging. A middle ear effusion is present. Decreased hearing is noted.  Nose: Nose normal. Right sinus exhibits no maxillary sinus tenderness and no frontal sinus tenderness. Left sinus exhibits no maxillary sinus tenderness and no frontal sinus tenderness.  Mouth/Throat: Uvula is midline. No posterior oropharyngeal edema or posterior oropharyngeal erythema.  Bilateral ears with cerumen, after lavage of ear, able to visualize bilateral TMs.  Eyes: Conjunctivae and lids are normal.  Neck: Trachea normal.  Cardiovascular: Normal rate, regular rhythm, S1 normal, S2 normal and normal heart sounds.  Pulmonary/Chest: Effort normal and breath sounds normal. She has no decreased breath sounds. She has no wheezes. She has no rhonchi. She has no rales.  Lymphadenopathy:    She has no cervical adenopathy.  Neurological: She is alert.  Skin: Skin is warm, dry and intact.  Psychiatric: She has a normal mood and affect. Her speech is normal and behavior is  normal.  Nursing note and vitals reviewed.  Procedure: Cerumen Disimpaction DEBROX drops applied and gentle ear lavage performed bilaterally. There were no complications and following the disimpaction the tympanic membrane was visible on the left. Tympanic membranes are intact following the procedure --> L is inflamed.  Auditory canals are normal.  The patient reported improvement of symptoms after removal of cerumen.   Assessment and  Plan:    Samantha Pena was seen today for left ear pain.  Diagnoses and all orders for this visit:  Acute otitis media, unspecified otitis media type and Frontal sinusitis, unspecified chronicity L AOM and sinusitis. Treat with Augmentin per orders. Start Flonase. Discussed holding her Xyzal and switching to Allegra-D. Follow-up if symptoms worsen or persist.   Other orders -     amoxicillin-clavulanate (AUGMENTIN) 875-125 MG tablet; Take 1 tablet by mouth 2 (two) times daily. -     fluticasone (FLONASE) 50 MCG/ACT nasal spray; Place 2 sprays into both nostrils daily.    . Reviewed expectations re: course of current medical issues. . Discussed self-management of symptoms. . Outlined signs and symptoms indicating need for more acute intervention. . Patient verbalized understanding and all questions were answered. . See orders for this visit as documented in the electronic medical record. . Patient received an After-Visit Summary.  CMA or LPN served as scribe during this visit. History, Physical, and Plan performed by medical provider. Documentation and orders reviewed and attested to.  Inda Coke, PA-C

## 2017-07-23 NOTE — Patient Instructions (Addendum)
Start the Augmentin antibiotic.  Change your Xyzal to Allegra-D for the next few days. Continue singulair.  Use Flonase twice daily.  Push fluids.  If symptoms worsen or persist despite treatment, please let us know.

## 2017-09-03 ENCOUNTER — Other Ambulatory Visit: Payer: Self-pay | Admitting: Obstetrics and Gynecology

## 2017-09-03 DIAGNOSIS — Z1231 Encounter for screening mammogram for malignant neoplasm of breast: Secondary | ICD-10-CM

## 2017-09-11 ENCOUNTER — Other Ambulatory Visit: Payer: Self-pay

## 2017-09-11 MED ORDER — SUMATRIPTAN 20 MG/ACT NA SOLN
NASAL | 3 refills | Status: DC
Start: 2017-09-11 — End: 2018-10-16

## 2017-09-11 MED ORDER — SUMATRIPTAN 20 MG/ACT NA SOLN: NASAL | 3 refills | Status: DC

## 2017-09-25 ENCOUNTER — Ambulatory Visit
Admission: RE | Admit: 2017-09-25 | Discharge: 2017-09-25 | Disposition: A | Discharge status: Home / Self Care | Payer: PRIVATE HEALTH INSURANCE | Source: Ambulatory Visit | Attending: Obstetrics and Gynecology | Admitting: Obstetrics and Gynecology

## 2017-09-25 DIAGNOSIS — Z1231 Encounter for screening mammogram for malignant neoplasm of breast: Secondary | ICD-10-CM

## 2017-11-03 ENCOUNTER — Other Ambulatory Visit: Payer: Self-pay | Admitting: Physician Assistant

## 2017-11-12 ENCOUNTER — Ambulatory Visit (INDEPENDENT_AMBULATORY_CARE_PROVIDER_SITE_OTHER): Payer: 59 | Admitting: Family Medicine

## 2017-11-12 ENCOUNTER — Encounter: Payer: Self-pay | Admitting: Family Medicine

## 2017-11-12 VITALS — BP 110/76 | HR 75 | Temp 98.4°F | Ht 66.0 in | Wt 169.8 lb

## 2017-11-12 DIAGNOSIS — L570 Actinic keratosis: Secondary | ICD-10-CM

## 2017-11-12 DIAGNOSIS — Z0000 Encounter for general adult medical examination without abnormal findings: Secondary | ICD-10-CM | POA: Diagnosis not present

## 2017-11-12 DIAGNOSIS — E785 Hyperlipidemia, unspecified: Secondary | ICD-10-CM | POA: Diagnosis not present

## 2017-11-12 DIAGNOSIS — Z6827 Body mass index (BMI) 27.0-27.9, adult: Secondary | ICD-10-CM | POA: Diagnosis not present

## 2017-11-12 DIAGNOSIS — Z1211 Encounter for screening for malignant neoplasm of colon: Secondary | ICD-10-CM

## 2017-11-12 DIAGNOSIS — G43C0 Periodic headache syndromes in child or adult, not intractable: Secondary | ICD-10-CM

## 2017-11-12 DIAGNOSIS — J301 Allergic rhinitis due to pollen: Secondary | ICD-10-CM

## 2017-11-12 DIAGNOSIS — Z01419 Encounter for gynecological examination (general) (routine) without abnormal findings: Secondary | ICD-10-CM | POA: Diagnosis not present

## 2017-11-12 DIAGNOSIS — N3941 Urge incontinence: Secondary | ICD-10-CM

## 2017-11-12 LAB — LIPID PANEL
CHOLESTEROL: 212 mg/dL — AB (ref 0–200)
HDL: 45.5 mg/dL (ref >39.00)
NonHDL: 166.5
TRIGLYCERIDES: 225 mg/dL — AB (ref 0.0–149.0)
Total CHOL/HDL Ratio: 5
VLDL: 45 mg/dL — ABNORMAL HIGH (ref 0.0–40.0)

## 2017-11-12 LAB — POC URINALSYSI DIPSTICK (AUTOMATED)
Bilirubin, UA: NEGATIVE
Blood, UA: NEGATIVE
GLUCOSE UA: NEGATIVE
Ketones, UA: NEGATIVE
LEUKOCYTES UA: NEGATIVE
NITRITE UA: NEGATIVE
Protein, UA: NEGATIVE
Spec Grav, UA: 1.015 (ref 1.010–1.025)
UROBILINOGEN UA: 0.2 U/dL
pH, UA: 8 (ref 5.0–8.0)

## 2017-11-12 LAB — COMPREHENSIVE METABOLIC PANEL
ALBUMIN: 4.6 g/dL (ref 3.5–5.2)
ALK PHOS: 38 U/L — AB (ref 39–117)
ALT: 7 U/L (ref 0–35)
AST: 14 U/L (ref 0–37)
BUN: 16 mg/dL (ref 6–23)
CALCIUM: 9.2 mg/dL (ref 8.4–10.5)
CO2: 28 mEq/L (ref 19–32)
CREATININE: 0.92 mg/dL (ref 0.40–1.20)
Chloride: 102 mEq/L (ref 96–112)
GFR: 68.62 mL/min (ref >60.00)
Glucose, Bld: 103 mg/dL — ABNORMAL HIGH (ref 70–99)
Potassium: 4.2 mEq/L (ref 3.5–5.1)
SODIUM: 138 meq/L (ref 135–145)
TOTAL PROTEIN: 7 g/dL (ref 6.0–8.3)
Total Bilirubin: 0.5 mg/dL (ref 0.2–1.2)

## 2017-11-12 LAB — CBC
HEMATOCRIT: 40.2 % (ref 36.0–46.0)
Hemoglobin: 13.5 g/dL (ref 12.0–15.0)
MCHC: 33.4 g/dL (ref 30.0–36.0)
MCV: 85.4 fl (ref 78.0–100.0)
PLATELETS: 268 10*3/uL (ref 150.0–400.0)
RBC: 4.72 Mil/uL (ref 3.87–5.11)
RDW: 13.7 % (ref 11.5–15.5)
WBC: 6.1 10*3/uL (ref 4.0–10.5)

## 2017-11-12 LAB — LDL CHOLESTEROL, DIRECT: Direct LDL: 132 mg/dL

## 2017-11-12 MED ORDER — FLUTICASONE PROPIONATE 50 MCG/ACT NA SUSP: NASAL | 3 refills | Status: DC

## 2017-11-12 NOTE — Assessment & Plan Note (Signed)
Seborrheic keratosis on right cheek to my view. Apparently has had cryotherapy before- she asks me to perform thsi to her right cheek. I dont see obvious AK though and told her would discuss with dermatology as she states has had done and insurance covered- I told her would be cosmetic. This looks more like seborrheic keratosis to me

## 2017-11-12 NOTE — Patient Instructions (Addendum)
Health Maintenance Due  Topic Date Due  . COLONOSCOPY -We will call you within two weeks about your referral to GI. If you do not hear within 3 weeks, give [place] a call.  09/04/2017   Please stop by the lab before you go.  Please trial cock up wrist splint at night - this sounds like carpal tunnel and this may help prevent it from happening/getting worse  Speak with dermatology about skin tags and what looks like seborrheic keratosis on right cheek.

## 2017-11-12 NOTE — Assessment & Plan Note (Signed)
HLD- poor control on no rx. Have wanted to focus on weight loss. Weight down 5 lbs today. Update lipids.   Overweight- we had discussed weight watchers but she has wanted to do this on her own- luckily she has had some weight loss

## 2017-11-12 NOTE — Assessment & Plan Note (Signed)
Some incontinence with laughing/sneezing for years. Prior had complained of frequency, incomplete emptying. May have mixed picture urge incontinence as well as OAB. Keeping up on her Kegels. The laught/sneeze is the bigger issues so meds less likely to help. Normal UAs in past. She wants to check another urine today. Discussed not a lot medication wise we can do for urge incontinence

## 2017-11-12 NOTE — Assessment & Plan Note (Signed)
Allergic rhinitis- she wanted to stop singulair last visit. She wanted to remain on xyzal- I asked her to see allergist. She still has issues including ear pain, sinus pressure. Still having some left ear pressure and gets more drainage than she did before. I looked in ear and no obvious abnormality other than light amount of wax- discussed may simply be draining appropriately.

## 2017-11-12 NOTE — Assessment & Plan Note (Signed)
Migraines- stable- imitrex 2-6x a month nasal spray. Menstruation still triggers

## 2017-11-12 NOTE — Progress Notes (Signed)
Phone: [phone]  Subjective:  Patient presents today for their annual physical. Chief complaint-noted.   See problem oriented charting- ROS- full  review of systems was completed and negative except for: some ear pain, sinus pressure, visual problems, skin color change, seasonal allergies, hand numbness  The following were reviewed and entered/updated in epic: Past Medical History:  Diagnosis Date  . Cyst October 2012   2016 stable. near rectum that gives pt discomfort when sitting and has grown in size in the past year   . Migraines    Patient Active Problem List   Diagnosis Date Noted  . Hyperlipidemia 10/31/2016    Priority: Medium  . Migraine headache 10/30/2006    Priority: Medium  . Overweight 05/07/2017    Priority: Low  . Allergic rhinitis 05/07/2017    Priority: Low  . TMJ disease 03/17/2015    Priority: Low  . Anal benign neoplasm 02/14/2011    Priority: Low  . ACTINIC KERATOSIS, CHEEK, LEFT 11/13/2007    Priority: Low  . Urge incontinence 11/12/2017   Past Surgical History:  Procedure Laterality Date  . CESAREAN SECTION  03/18/1999  . FOOT SURGERY  09/2002   bone in toe had to be shaved and realigned     Family History  Problem Relation Age of Onset  . Cancer Mother 3       breast cancer - passed from  . Breast cancer Mother 92  . Diabetes Father        52s  . Hypertension Father   . Breast cancer Sister 12  . Epilepsy Sister   . Hyperlipidemia Brother     Medications- reviewed and updated Current Outpatient Medications  Medication Sig Dispense Refill  . Calcium Carbonate (CHEWABLE CALCIUM PO) Take by mouth daily.      . fluticasone (FLONASE) 50 MCG/ACT nasal spray USE 2 SPRAY(S) IN EACH NOSTRIL ONCE DAILY 16 g 3  . Ibuprofen (MOTRIN PO) Take by mouth as needed.      Marland Kitchen levocetirizine (XYZAL) 5 MG tablet Take 5 mg by mouth every evening.    . montelukast (SINGULAIR) 10 MG tablet Take 10 mg by mouth at bedtime.    . Multiple  Vitamins-Minerals (MULTI FOR HER PO) Take by mouth daily.      . SUMAtriptan (IMITREX) 20 MG/ACT nasal spray USE 1 SPRAY INTO NOSE EVERY 2 HOURS AS DIRECTED AS NEEDED 18 Act 3   No current facility-administered medications for this visit.     Allergies-reviewed and updated No Known Allergies  Social History   Social History Narrative   Married (mom Donny Pique, husband also patient here). 2 children- girls 19 and 16 in 2016.       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 Oropharynx normal Neck: no thyromegaly CV: RRR no murmurs rubs or gallops Lungs: CTAB no crackles, wheeze, rhonchi Abdomen: soft/nontender/nondistended/normal bowel sounds. No rebound or guarding.  Ext: no edema Skin: warm, dry Neuro: grossly normal, moves all extremities, PERRLA  Assessment/Plan:  52 y.o. female presenting for  annual physical.  Health Maintenance counseling: 1. Anticipatory guidance: Patient counseled regarding regular dental exams -q6 months, eye exams - 2-3 years ago but no issues other than cant see without readers for close up, wearing seatbelts.  2. Risk factor reduction:  Advised patient of need for regular exercise and diet rich and fruits and vegetables to reduce risk of heart attack and stroke. Exercise- wants to work on this- has not been doing much recently- about once a week. Diet- down 5 lbs from last visit- has cut down on calories. Less carbs/sugar Wt Readings from Last 3 Encounters:  11/12/17 169 lb 12.8 oz (77 kg)  07/23/17 168 lb 8 oz (76.4 kg)  05/07/17 174 lb 3.2 oz (79 kg)  3. Immunizations/screenings/ancillary  studies- discussed shingrix availability issues. She was immunized from chicken pox in 1990s. Makes less sense for her since has never had chicken pox and was immunized Immunization History  Administered Date(s) Administered  . Influenza Split 02/07/2011  . Influenza Whole 02/12/2007, 02/04/2008, 01/26/2009, 12/27/2009  . Influenza,inj,Quad PF,6+ Mos 01/12/2014, 12/22/2014, 03/01/2016, 12/26/2016  . Td 05/03/2006  . Tdap 10/31/2016  4. Cervical cancer screening- yearly with Dr. Corinna Capra gyn. Has that this afternoon 5. Breast cancer screening-  breast exam with GYN and mammogram 09/25/17. Mom and sister with breast cancer history 6. Colon cancer screening - refer today though she wants to do later in the year 7. Skin cancer screening- sees dermatology yearly-she thinks [facility]. advised regular sunscreen use. Denies worrisome, changing, or new skin lesions.  8. Birth control/STD check- no active birth control plan at present- discussed could still get present. Monogamous- no concerns 9. Osteoporosis screening at 66- will plan on this at 76. Has had heel test a few years ago.   Status of chronic or acute concerns   Carpal tunnel-Hand numbness, wakes up and hands numbs in middle of night. Discussed diagnosis and cock up wrist splint at night. She will try this.   Several skin tags in axilla. Discussed weight loss for prevention. She is going to talk to dermatology about having removed- I told her my concern was like seb keratosis that this may be viewed as cosmetic and thus not covered. Would look to their expert opinion for possible indications for removal.   Migraine headache Migraines- stable- imitrex 2-6x a month nasal spray. Menstruation still triggers  Hyperlipidemia HLD- poor control on no rx. Have wanted to focus on weight loss. Weight down 5 lbs today. Update lipids.   Overweight- we had discussed weight watchers but she has wanted to do this on her own- luckily she has had some  weight loss   Allergic rhinitis Allergic rhinitis- she wanted to stop singulair last visit. She wanted to remain on xyzal- I asked her to see allergist. She still has issues including ear pain, sinus pressure. Still having some left ear pressure and gets more drainage than she did before. I looked in ear and no obvious abnormality other than light amount of wax- discussed may simply be draining appropriately.   ACTINIC KERATOSIS, CHEEK, LEFT Seborrheic keratosis on right cheek to my view. Apparently has had cryotherapy before- she asks me to perform thsi to her right cheek. I dont see obvious AK though and told her would discuss with dermatology as she states has had done and insurance covered- I told her would be cosmetic. This looks more like seborrheic keratosis to me   Urge incontinence Some incontinence with laughing/sneezing for years. Prior had complained of frequency,  incomplete emptying. May have mixed picture urge incontinence as well as OAB. Keeping up on her Kegels. The laught/sneeze is the bigger issues so meds less likely to help. Normal UAs in past. She wants to check another urine today. Discussed not a lot medication wise we can do for urge incontinence  Saw her q6 months due to weight gain but since she has reversed this we will return to yearly CPE   Lab/Order associations: Preventative health care - Plan: CBC, Comprehensive metabolic panel, Lipid panel, POCT Urinalysis Dipstick (Automated), POCT Urinalysis Dipstick (Automated)  Screen for colon cancer - Plan: Ambulatory referral to Gastroenterology  Hyperlipidemia, unspecified hyperlipidemia type - Plan: CBC, Comprehensive metabolic panel, Lipid panel, POCT Urinalysis Dipstick (Automated), POCT Urinalysis Dipstick (Automated)  Meds ordered this encounter  Medications  . fluticasone (FLONASE) 50 MCG/ACT nasal spray    Sig: USE 2 SPRAY(S) IN EACH NOSTRIL ONCE DAILY    Dispense:  16 g    Refill:  3    Please consider 90  day supplies to promote better adherence    Return precautions advised.  Garret Reddish, MD

## 2017-11-15 DIAGNOSIS — Z800 Family history of malignant neoplasm of digestive organs: Secondary | ICD-10-CM | POA: Diagnosis not present

## 2017-11-15 DIAGNOSIS — Z8042 Family history of malignant neoplasm of prostate: Secondary | ICD-10-CM | POA: Diagnosis not present

## 2017-11-15 DIAGNOSIS — Z803 Family history of malignant neoplasm of breast: Secondary | ICD-10-CM | POA: Diagnosis not present

## 2017-12-24 DIAGNOSIS — L918 Other hypertrophic disorders of the skin: Secondary | ICD-10-CM | POA: Diagnosis not present

## 2017-12-24 DIAGNOSIS — Z809 Family history of malignant neoplasm, unspecified: Secondary | ICD-10-CM | POA: Diagnosis not present

## 2017-12-24 DIAGNOSIS — L821 Other seborrheic keratosis: Secondary | ICD-10-CM | POA: Diagnosis not present

## 2017-12-26 ENCOUNTER — Other Ambulatory Visit: Payer: Self-pay | Admitting: Physician Assistant

## 2018-01-28 ENCOUNTER — Ambulatory Visit (INDEPENDENT_AMBULATORY_CARE_PROVIDER_SITE_OTHER): Payer: 59

## 2018-01-28 ENCOUNTER — Encounter: Payer: Self-pay | Admitting: Family Medicine

## 2018-01-28 DIAGNOSIS — Z23 Encounter for immunization: Secondary | ICD-10-CM

## 2018-07-08 ENCOUNTER — Encounter: Payer: Self-pay | Admitting: Gastroenterology

## 2018-08-05 ENCOUNTER — Other Ambulatory Visit: Payer: Self-pay

## 2018-08-05 ENCOUNTER — Ambulatory Visit (AMBULATORY_SURGERY_CENTER): Payer: Self-pay

## 2018-08-05 ENCOUNTER — Telehealth: Payer: Self-pay

## 2018-08-05 VITALS — Ht 66.0 in | Wt 172.0 lb

## 2018-08-05 DIAGNOSIS — Z1211 Encounter for screening for malignant neoplasm of colon: Secondary | ICD-10-CM

## 2018-08-05 MED ORDER — NA SULFATE-K SULFATE-MG SULF 17.5-3.13-1.6 GM/177ML PO SOLN: 1.0000 | Freq: Once | ORAL | 0 refills | Status: AC

## 2018-08-05 NOTE — Telephone Encounter (Signed)
Noted, Dr. Modena Nunnery

## 2018-08-05 NOTE — Telephone Encounter (Signed)
Samantha Pena,  This pt is cleared for anesthetic care at [facility].  Thanks,  Osvaldo Angst

## 2018-08-05 NOTE — Progress Notes (Signed)
No egg or soy allergy known to patient  No issues with past sedation with any surgeries  or procedures, no intubation problems  No diet pills per patient No home 02 use per patient  No blood thinners per patient  Pt denies issues with constipation  No A fib or A flutter  EMMI video sent to pt's e mail  

## 2018-08-05 NOTE — Telephone Encounter (Signed)
I would not be able to remove those during a colonoscopy. That would require a procedure different from a colonoscopy with a surgeon.  Thank you.

## 2018-08-05 NOTE — Telephone Encounter (Signed)
Pt had previsit this morning. Pt verbalize she has two small cyst around the left side of her anus. Pt said a surgeon told her she would have to be put to sleep to remove them. Pt wanted to know if they can be removed while she is sedated for colonoscopy.    John, pt takes a nasal spray for her migraines, wants to know if it's okay for her to take the morning of her procedure should she wake up with headache. She is concern if she can take it after her cut off time to be npo. Please advise

## 2018-08-15 ENCOUNTER — Other Ambulatory Visit: Payer: Self-pay | Admitting: Obstetrics and Gynecology

## 2018-08-15 DIAGNOSIS — Z1231 Encounter for screening mammogram for malignant neoplasm of breast: Secondary | ICD-10-CM

## 2018-08-19 ENCOUNTER — Telehealth: Payer: Self-pay | Admitting: *Deleted

## 2018-08-19 ENCOUNTER — Encounter: Payer: Self-pay | Admitting: Gastroenterology

## 2018-08-19 NOTE — Telephone Encounter (Signed)
Pt's screening colon was scheduled for 09-02-2018, Monday- Called pt to RS until after 5-18 Per Covid 19 guidelines.  RS with pt to 5-28 Thursday at 2 pm. Pt may call and RS into June for a Monday . Printed and mailed new Suprep prep instructions with new date and times    Lelan Pons PV

## 2018-08-26 ENCOUNTER — Encounter: Payer: 59 | Admitting: Gastroenterology

## 2018-09-02 ENCOUNTER — Encounter: Payer: 59 | Admitting: Gastroenterology

## 2018-09-09 ENCOUNTER — Telehealth: Payer: Self-pay | Admitting: Gastroenterology

## 2018-09-09 NOTE — Telephone Encounter (Signed)
Pt rescheduled colonoscopy and requested new instructions mailed to her home.

## 2018-09-09 NOTE — Telephone Encounter (Signed)
New Suprep instructions mailed to pt.

## 2018-09-26 ENCOUNTER — Encounter: Payer: Managed Care, Other (non HMO) | Admitting: Gastroenterology

## 2018-10-09 ENCOUNTER — Telehealth: Payer: Self-pay | Admitting: *Deleted

## 2018-10-09 NOTE — Telephone Encounter (Signed)
LMOM, will call back

## 2018-10-10 ENCOUNTER — Other Ambulatory Visit: Payer: Self-pay

## 2018-10-10 ENCOUNTER — Ambulatory Visit
Admission: RE | Admit: 2018-10-10 | Discharge: 2018-10-10 | Disposition: A | Discharge status: Home / Self Care | Payer: Managed Care, Other (non HMO) | Source: Ambulatory Visit | Attending: Obstetrics and Gynecology | Admitting: Obstetrics and Gynecology

## 2018-10-10 ENCOUNTER — Telehealth: Payer: Self-pay

## 2018-10-10 DIAGNOSIS — Z1231 Encounter for screening mammogram for malignant neoplasm of breast: Secondary | ICD-10-CM

## 2018-10-10 NOTE — Telephone Encounter (Signed)
Tried to leave a second message but the line went busy after 10 rings

## 2018-10-11 ENCOUNTER — Other Ambulatory Visit: Payer: Self-pay

## 2018-10-11 ENCOUNTER — Encounter: Payer: Self-pay | Admitting: Gastroenterology

## 2018-10-11 ENCOUNTER — Ambulatory Visit (AMBULATORY_SURGERY_CENTER): Payer: Managed Care, Other (non HMO) | Admitting: Gastroenterology

## 2018-10-11 VITALS — BP 113/71 | HR 75 | Temp 98.7°F | Resp 13 | Ht 66.0 in | Wt 172.0 lb

## 2018-10-11 DIAGNOSIS — Z1211 Encounter for screening for malignant neoplasm of colon: Secondary | ICD-10-CM

## 2018-10-11 MED ORDER — SODIUM CHLORIDE 0.9 % IV SOLN: 500.0000 mL | Freq: Once | INTRAVENOUS | Status: DC

## 2018-10-11 NOTE — Patient Instructions (Signed)
YOU HAD AN ENDOSCOPIC PROCEDURE TODAY AT [facility]:   Refer to the procedure report that was given to you for any specific questions about what was found during the examination.  If the procedure report does not answer your questions, please call your gastroenterologist to clarify.  If you requested that your care partner not be given the details of your procedure findings, then the procedure report has been included in a sealed envelope for you to review at your convenience later.  YOU SHOULD EXPECT: Some feelings of bloating in the abdomen. Passage of more gas than usual.  Walking can help get rid of the air that was put into your GI tract during the procedure and reduce the bloating. If you had a lower endoscopy (such as a colonoscopy or flexible sigmoidoscopy) you may notice spotting of blood in your stool or on the toilet paper. If you underwent a bowel prep for your procedure, you may not have a normal bowel movement for a few days.  Please Note:  You might notice some irritation and congestion in your nose or some drainage.  This is from the oxygen used during your procedure.  There is no need for concern and it should clear up in a day or so.  SYMPTOMS TO REPORT IMMEDIATELY:   Following lower endoscopy (colonoscopy or flexible sigmoidoscopy):  Excessive amounts of blood in the stool  Significant tenderness or worsening of abdominal pains  Swelling of the abdomen that is new, acute  Fever of 100F or higher  For urgent or emergent issues, a gastroenterologist can be reached at any hour by calling [phone].  DIET:  We do recommend a small meal at first, but then you may proceed to your regular diet.  Drink plenty of fluids but you should avoid alcoholic beverages for 24 hours.  ACTIVITY:  You should plan to take it easy for the rest of today and you should NOT DRIVE or use heavy machinery until tomorrow (because of the sedation medicines used during the test).     FOLLOW UP: Our staff will call the number listed on your records 48-72 hours following your procedure to check on you and address any questions or concerns that you may have regarding the information given to you following your procedure. If we do not reach you, we will leave a message.  We will attempt to reach you two times.  During this call, we will ask if you have developed any symptoms of COVID 19. If you develop any symptoms (ie: fever, flu-like symptoms, shortness of breath, cough etc.) before then, please call [phone].  If you test positive for Covid 19 in the 2 weeks post procedure, please call and report this information to [place].    SIGNATURES/CONFIDENTIALITY: You and/or your care partner have signed paperwork which will be entered into your electronic medical record.  These signatures attest to the fact that that the information above on your After Visit Summary has been reviewed and is understood.  Full responsibility of the confidentiality of this discharge information lies with you and/or your care-partner.  No polyps!!  Next colonoscopy-10 years Continue your normal medications and diet  Please read handout about hemorrhoids

## 2018-10-11 NOTE — Progress Notes (Signed)
Nancy Campbell, LPN- Temp Judy Branson, CMA- Vitals 

## 2018-10-11 NOTE — Progress Notes (Signed)
Pt's states no medical or surgical changes since previsit or office visit. 

## 2018-10-11 NOTE — Op Note (Signed)
[facility] Patient Name: Samantha Pena Procedure Date: 10/11/2018 1:37 PM MRN: [id number] Endoscopist: Thornton Park MD, MD Age: 53 Referring MD:  Date of Birth: [birth year] Gender: Female Account #: [id number] Procedure:                Colonoscopy Indications:              Screening for colorectal malignant neoplasm, This                            is the patient's first colonoscopy. No known family                            history of colon cancer or polyps. Medicines:                See the Anesthesia note for documentation of the                            administered medications Procedure:                Pre-Anesthesia Assessment:                           - Prior to the procedure, a History and Physical                            was performed, and patient medications and                            allergies were reviewed. The patient's tolerance of                            previous anesthesia was also reviewed. The risks                            and benefits of the procedure and the sedation                            options and risks were discussed with the patient.                            All questions were answered, and informed consent                            was obtained. Prior Anticoagulants: The patient has                            taken no previous anticoagulant or antiplatelet                            agents. ASA Grade Assessment: II - A patient with                            mild systemic disease. After reviewing the risks  and benefits, the patient was deemed in                            satisfactory condition to undergo the procedure.                           After obtaining informed consent, the colonoscope                            was passed under direct vision. Throughout the                            procedure, the patient's blood pressure, pulse, and                            oxygen saturations were  monitored continuously. The                            Colonoscope was introduced through the anus and                            advanced to the the terminal ileum, with                            identification of the appendiceal orifice and IC                            valve. The colonoscopy was performed without                            difficulty. The patient tolerated the procedure                            well. The quality of the bowel preparation was                            good. The terminal ileum, ileocecal valve,                            appendiceal orifice, and rectum were photographed. Scope In: 1:51:19 PM Scope Out: 2:03:01 PM Scope Withdrawal Time: 0 hours 7 minutes 47 seconds  Total Procedure Duration: 0 hours 11 minutes 42 seconds  Findings:                 Hemorrhoids were found on perianal exam and on                            retroflexed views of the rectum.                           The entire examined colon appeared normal on direct                            and retroflexion views. Complications:  No immediate complications. Estimated Blood Loss:     Estimated blood loss: none. Impression:               - Hemorrhoids found on perianal exam and                            retroflexed views of the colon.                           - The entire examined colon is normal on direct and                            retroflexion views.                           - No specimens collected. Recommendation:           - Patient has a contact number available for                            emergencies. The signs and symptoms of potential                            delayed complications were discussed with the                            patient. Return to normal activities tomorrow.                            Written discharge instructions were provided to the                            patient.                           - Resume regular diet today.                            - Continue present medications.                           - Repeat colonoscopy in 10 years for screening                            purposes, or earlier with any new symptoms. Thornton Park MD, MD 10/11/2018 2:11:01 PM This report has been signed electronically.

## 2018-10-14 ENCOUNTER — Other Ambulatory Visit: Payer: Self-pay | Admitting: Obstetrics and Gynecology

## 2018-10-14 DIAGNOSIS — R928 Other abnormal and inconclusive findings on diagnostic imaging of breast: Secondary | ICD-10-CM

## 2018-10-15 ENCOUNTER — Telehealth: Payer: Self-pay | Admitting: *Deleted

## 2018-10-15 ENCOUNTER — Other Ambulatory Visit: Payer: Self-pay | Admitting: Family Medicine

## 2018-10-15 NOTE — Telephone Encounter (Signed)
  Follow up Call-  Call back number 10/11/2018  Post procedure Call Back phone  # [phone]  Permission to leave phone message Yes  Some recent data might be hidden     Patient questions:  Do you have a fever, pain , or abdominal swelling? No. Pain Score  0 *  Have you tolerated food without any problems? Yes.    Have you been able to return to your normal activities? Yes.    Do you have any questions about your discharge instructions: Diet   No. Medications  No. Follow up visit  No.  Do you have questions or concerns about your Care? No.  Actions: * If pain score is 4 or above: No action needed, pain <4.   1. Have you developed a fever since your procedure? no  2.   Have you had an respiratory symptoms (SOB or cough) since your procedure? no  3.   Have you tested positive for COVID 19 since your procedure no  4.   Have you had any family members/close contacts diagnosed with the COVID 19 since your procedure?  no   If yes to any of these questions please route to Joylene John, RN and Alphonsa Gin, Therapist, sports.

## 2018-10-15 NOTE — Telephone Encounter (Signed)
Last OV 11/12/17 Last refill 09/11/17 #18/3 Next OV not scheduled  Needs appointment.

## 2018-10-16 NOTE — Telephone Encounter (Signed)
Called and scheduled pt for CPE 11/12/18 - she has new health insurance.

## 2018-10-17 ENCOUNTER — Ambulatory Visit
Admission: RE | Admit: 2018-10-17 | Discharge: 2018-10-17 | Disposition: A | Discharge status: Home / Self Care | Payer: Managed Care, Other (non HMO) | Source: Ambulatory Visit | Attending: Obstetrics and Gynecology | Admitting: Obstetrics and Gynecology

## 2018-10-17 ENCOUNTER — Other Ambulatory Visit: Payer: Self-pay

## 2018-10-17 ENCOUNTER — Other Ambulatory Visit: Payer: Self-pay | Admitting: Obstetrics and Gynecology

## 2018-10-17 DIAGNOSIS — R928 Other abnormal and inconclusive findings on diagnostic imaging of breast: Secondary | ICD-10-CM

## 2018-10-17 DIAGNOSIS — N6001 Solitary cyst of right breast: Secondary | ICD-10-CM

## 2018-11-12 ENCOUNTER — Encounter: Payer: Self-pay | Admitting: Family Medicine

## 2018-11-12 ENCOUNTER — Other Ambulatory Visit: Payer: Self-pay

## 2018-11-12 ENCOUNTER — Ambulatory Visit (INDEPENDENT_AMBULATORY_CARE_PROVIDER_SITE_OTHER): Payer: Managed Care, Other (non HMO) | Admitting: Family Medicine

## 2018-11-12 VITALS — BP 110/70 | HR 86 | Temp 98.3°F | Ht 66.0 in | Wt 171.2 lb

## 2018-11-12 DIAGNOSIS — G43C0 Periodic headache syndromes in child or adult, not intractable: Secondary | ICD-10-CM

## 2018-11-12 DIAGNOSIS — Z0000 Encounter for general adult medical examination without abnormal findings: Secondary | ICD-10-CM | POA: Diagnosis not present

## 2018-11-12 DIAGNOSIS — E785 Hyperlipidemia, unspecified: Secondary | ICD-10-CM

## 2018-11-12 DIAGNOSIS — E663 Overweight: Secondary | ICD-10-CM

## 2018-11-12 DIAGNOSIS — R202 Paresthesia of skin: Secondary | ICD-10-CM

## 2018-11-12 DIAGNOSIS — Z8619 Personal history of other infectious and parasitic diseases: Secondary | ICD-10-CM | POA: Insufficient documentation

## 2018-11-12 MED ORDER — FLUTICASONE PROPIONATE 50 MCG/ACT NA SUSP: NASAL | 3 refills | Status: DC

## 2018-11-12 MED ORDER — SUMATRIPTAN 20 MG/ACT NA SOLN: NASAL | 5 refills | Status: DC

## 2018-11-12 NOTE — Progress Notes (Signed)
Phone: [phone]   Subjective:  Patient presents today for their annual physical. Chief complaint-noted.   See problem oriented charting- ROS- full  review of systems was completed and negative except for: numbness hands/feet, seasonal allergies,   The following were reviewed and entered/updated in epic: Past Medical History:  Diagnosis Date  . Anxiety   . Cyst October 2012   2016 stable. near rectum that gives pt discomfort when sitting and has grown in size in the past year   . Migraines    Patient Active Problem List   Diagnosis Date Noted  . Hyperlipidemia 10/31/2016    Priority: Medium  . Migraine headache 10/30/2006    Priority: Medium  . Overweight 05/07/2017    Priority: Low  . Allergic rhinitis 05/07/2017    Priority: Low  . TMJ disease 03/17/2015    Priority: Low  . Anal benign neoplasm 02/14/2011    Priority: Low  . ACTINIC KERATOSIS, CHEEK, LEFT 11/13/2007    Priority: Low  . History of Lyme disease 11/12/2018  . Urge incontinence 11/12/2017   Past Surgical History:  Procedure Laterality Date  . CESAREAN SECTION  03/18/1999  . FOOT SURGERY  09/2002   bone in toe had to be shaved and realigned     Family History  Problem Relation Age of Onset  . Cancer Mother 25       breast cancer - passed from  . Breast cancer Mother 21  . Diabetes Father        39s  . Hypertension Father   . Breast cancer Sister 58  . Epilepsy Sister   . Hyperlipidemia Brother   . Colon cancer Neg Hx   . Esophageal cancer Neg Hx   . Rectal cancer Neg Hx   . Stomach cancer Neg Hx     Medications- reviewed and updated Current Outpatient Medications  Medication Sig Dispense Refill  . Calcium Carbonate (CHEWABLE CALCIUM PO) Take by mouth daily.      . fluticasone (FLONASE) 50 MCG/ACT nasal spray USE 2 SPRAY(S) IN EACH NOSTRIL ONCE DAILY 16 g 3  . Ibuprofen (MOTRIN PO) Take by mouth as needed.      Marland Kitchen levocetirizine (XYZAL) 5 MG tablet Take 5 mg by mouth every evening.     . montelukast (SINGULAIR) 10 MG tablet Take 10 mg by mouth at bedtime.    . Multiple Vitamins-Minerals (MULTI FOR HER PO) Take by mouth daily.      . SUMAtriptan (IMITREX) 20 MG/ACT nasal spray USE 1 SPRAY INTO SINGLE NOSTRIL EVERY 2 HOURS AS DIRECTED AS NEEDED 3 Inhaler 0  . tolterodine (DETROL LA) 4 MG 24 hr capsule Take by mouth daily.     No current facility-administered medications for this visit.     Allergies-reviewed and updated No Known Allergies  Social History   Social History Narrative   Married (mom Donny Pique, husband also patient here). 2 children- girls 19 and 16 in 2016.       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 Oropharynx normal Neck: no thyromegaly and no cervical lymphadenopathy CV: RRR no murmurs rubs or gallops Lungs: CTAB no crackles, wheeze, rhonchi Abdomen: soft/nontender/nondistended/normal bowel sounds. No rebound or guarding.  Ext: no edema and 2+ PT pulses Skin: warm, dry Neuro: grossly normal, moves all extremities, PERRLA    Assessment and Plan   53 y.o. female presenting for annual physical.  Health Maintenance counseling: 1. Anticipatory guidance: Patient counseled regarding regular dental exams -q6 months, eye exams - has not recently seen eye doctor- last ween 2 years ago- readers only,  avoiding smoking and second hand smoke , limiting alcohol to 1 beverage per day .   2. Risk factor reduction:  Advised patient of need for regular exercise and diet rich and fruits and vegetables to reduce risk of heart attack and stroke. Exercise- walking most nights  for an hour with dog. Diet-does not eat large portions- has salad, veggies, meat- doesn't do much pastas- some sweets but doesn't overindulge. . Bothered by weight gain- doesn't feel like shes eating more. Thinks had TSH with Dr. Corinna Capra of GYN. Wants to get back to 155.  Wt Readings from Last 3 Encounters:  11/12/18 171 lb 3.2 oz (77.7 kg)  10/11/18 172 lb (78 kg)  08/05/18 172 lb (78 kg)  3. Immunizations/screenings/ancillary studies- shingrix defers due to covid 19 Immunization History  Administered Date(s) Administered  . Influenza Split 02/07/2011  . Influenza Whole 02/12/2007, 02/04/2008, 01/26/2009, 12/27/2009  . Influenza,inj,Quad PF,6+ Mos 01/12/2014, 12/22/2014, 03/01/2016, 12/26/2016, 01/28/2018  . Td 05/03/2006  . Tdap 10/31/2016  4. Cervical cancer screening- 10/2016 but had one last month 5. Breast cancer screening-  breast exam last month and mammogram - 3d June 2020- 6 month follow up for hemorrhagic cyst likely 6. Colon cancer screening - 09/2018 and 10 year follow up  7. Skin cancer screening- Dr. Ronnald Ramp [place] derm. advised regular sunscreen use. Denies worrisome, changing, or new skin lesions.  8. Birth control/STD check- monogomous and uses condoms 9. Osteoporosis screening at 19- will plan on this 10. never smoker  Status of chronic or acute concerns   # furloughed since march. Husband still able to work. Works for [employer]. 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 Caryl Pina graduated in 2019- furloughed but now back at work. [place] [employer] freshman at [employer]- paying out of state tuition for online classes- vet school planned  Hyperlipidemia - not on statin- nonfasting lipids today Lab Results  Component Value Date   CHOL 212 (H) 11/12/2017   HDL 45.50 11/12/2017   LDLCALC 111 (H) 03/23/2015   LDLDIRECT 132.0 11/12/2017   TRIG 225.0 (H) 11/12/2017   CHOLHDL 5 11/12/2017   Migraines - Takes Imitrex prn.    Urge Incontinence - Taking Detrol LA 4 mg from gynecology  Numbness in hands and feet - Sx x 1+ years. Worse at night. No swelling in hands or feet. Worse at night. Does not spare pinky. Will check b12 level. Not a major issue- declines neuro referral  - prior lyme disease only able to tolerate 3 days doxycycline as was at the beach- wonders about some nerve damage from this. Had been in [place]  Allergies doing well on flonase- refilled today  Recommended follow up:  1 year physical Future Appointments  Date Time Provider Clint  04/21/2019 10:30 AM [facility] [place] 1 [facility]   Lab/Order associations: waffles with blueberries- lunch  pasta salad   ICD-10-CM   1. Preventative health care  Z00.00 CBC    Comprehensive metabolic panel    Lipid panel    Vitamin B12  2. Hyperlipidemia, unspecified hyperlipidemia type  E78.5 CBC    Comprehensive metabolic panel    Lipid panel  3. Periodic headache syndrome, not intractable  G43.C0   4. Overweight  E66.3   5. Paresthesias  R20.2 Vitamin B12  6. History of Lyme disease  Z86.19     Meds ordered this encounter  Medications  . fluticasone (FLONASE) 50 MCG/ACT nasal spray    Sig: USE 2 SPRAY(S) IN EACH NOSTRIL ONCE DAILY    Dispense:  16 g    Refill:  3    Please consider 90 day supplies to promote better adherence   Return precautions advised.  Garret Reddish, MD

## 2018-11-12 NOTE — Patient Instructions (Addendum)
Please stop by lab before you go If you do not have mychart- we will call you about results within 5 business days of [place] receiving them.  If you have mychart- we will send your results within 3 business days of [place] receiving them.  If abnormal or we want to clarify a result, we will call or mychart you to make sure you receive the message.  If you have questions or concerns or don't hear within 5-7 days, please send [place] a message or call us.   Try myfitnesspal for calorie tracking  Let us know if numbness/tingling issues  No changes today

## 2018-11-13 LAB — COMPREHENSIVE METABOLIC PANEL
ALT: 5 U/L (ref 0–35)
AST: 13 U/L (ref 0–37)
Albumin: 4.6 g/dL (ref 3.5–5.2)
Alkaline Phosphatase: 40 U/L (ref 39–117)
BUN: 11 mg/dL (ref 6–23)
CO2: 26 mEq/L (ref 19–32)
Calcium: 9.4 mg/dL (ref 8.4–10.5)
Chloride: 103 mEq/L (ref 96–112)
Creatinine, Ser: 0.86 mg/dL (ref 0.40–1.20)
GFR: 69.51 mL/min (ref >60.00)
Glucose, Bld: 90 mg/dL (ref 70–99)
Potassium: 4.2 mEq/L (ref 3.5–5.1)
Sodium: 138 mEq/L (ref 135–145)
Total Bilirubin: 0.3 mg/dL (ref 0.2–1.2)
Total Protein: 6.8 g/dL (ref 6.0–8.3)

## 2018-11-13 LAB — CBC
HCT: 40.2 % (ref 36.0–46.0)
Hemoglobin: 13 g/dL (ref 12.0–15.0)
MCHC: 32.4 g/dL (ref 30.0–36.0)
MCV: 85.5 fl (ref 78.0–100.0)
Platelets: 262 10*3/uL (ref 150.0–400.0)
RBC: 4.7 Mil/uL (ref 3.87–5.11)
RDW: 13.6 % (ref 11.5–15.5)
WBC: 7.1 10*3/uL (ref 4.0–10.5)

## 2018-11-13 LAB — VITAMIN B12: Vitamin B-12: 319 pg/mL (ref 211–911)

## 2018-11-13 LAB — LDL CHOLESTEROL, DIRECT: Direct LDL: 136 mg/dL

## 2018-11-13 LAB — LIPID PANEL
Cholesterol: 207 mg/dL — ABNORMAL HIGH (ref 0–200)
HDL: 37.2 mg/dL — ABNORMAL LOW (ref >39.00)
NonHDL: 169.33
Total CHOL/HDL Ratio: 6
Triglycerides: 316 mg/dL — ABNORMAL HIGH (ref 0.0–149.0)
VLDL: 63.2 mg/dL — ABNORMAL HIGH (ref 0.0–40.0)

## 2019-01-15 ENCOUNTER — Other Ambulatory Visit: Payer: Self-pay

## 2019-01-15 ENCOUNTER — Ambulatory Visit (INDEPENDENT_AMBULATORY_CARE_PROVIDER_SITE_OTHER): Payer: Managed Care, Other (non HMO)

## 2019-01-15 DIAGNOSIS — Z23 Encounter for immunization: Secondary | ICD-10-CM | POA: Diagnosis not present

## 2019-02-26 DIAGNOSIS — Z20828 Contact with and (suspected) exposure to other viral communicable diseases: Secondary | ICD-10-CM | POA: Diagnosis not present

## 2019-04-21 ENCOUNTER — Ambulatory Visit
Admission: RE | Admit: 2019-04-21 | Discharge: 2019-04-21 | Disposition: A | Discharge status: Home / Self Care | Payer: Managed Care, Other (non HMO) | Source: Ambulatory Visit | Attending: Obstetrics and Gynecology | Admitting: Obstetrics and Gynecology

## 2019-04-21 ENCOUNTER — Other Ambulatory Visit: Payer: Self-pay | Admitting: Obstetrics and Gynecology

## 2019-04-21 ENCOUNTER — Other Ambulatory Visit: Payer: Self-pay

## 2019-04-21 DIAGNOSIS — N6001 Solitary cyst of right breast: Secondary | ICD-10-CM

## 2019-04-22 ENCOUNTER — Other Ambulatory Visit: Payer: Self-pay | Admitting: Family Medicine

## 2019-09-10 ENCOUNTER — Other Ambulatory Visit: Payer: Self-pay

## 2019-09-10 ENCOUNTER — Encounter: Payer: Self-pay | Admitting: Podiatry

## 2019-09-10 ENCOUNTER — Ambulatory Visit (INDEPENDENT_AMBULATORY_CARE_PROVIDER_SITE_OTHER): Payer: Managed Care, Other (non HMO) | Admitting: Podiatry

## 2019-09-10 DIAGNOSIS — L923 Foreign body granuloma of the skin and subcutaneous tissue: Secondary | ICD-10-CM

## 2019-09-10 DIAGNOSIS — M722 Plantar fascial fibromatosis: Secondary | ICD-10-CM

## 2019-09-10 NOTE — Progress Notes (Signed)
Subjective:   Patient ID: Samantha Pena, female   DOB: 54 y.o.   MRN: [id number]   HPI Patient states she is developed a lot of inflammation underneath the left arch and states there is a lesion there secondary to trauma when she was walking   ROS      Objective:  Physical Exam  Neurovascular status intact with inflammation of the mid arch area left and a keratotic lesion which appears to be due to trauma which occurred in January and she did have bleeding of the area and has not noted redness or drainage     Assessment:  Mid arch fasciitis left along with probable foreign body plantar left     Plan:  H&P reviewed and today I did sterile prep and injected the mid arch 3 mg Kenalog 5 mg Xylocaine I debrided the lesion I did not note any breakdown of tissue and I reviewed foreign body and I cleaned the area out and applied sterile dressing.  I reviewed that this may require a deeper look and it is possible it may require outpatient surgery or opening of the area entirely

## 2019-10-20 ENCOUNTER — Ambulatory Visit
Admission: RE | Admit: 2019-10-20 | Discharge: 2019-10-20 | Disposition: A | Discharge status: Home / Self Care | Payer: Managed Care, Other (non HMO) | Source: Ambulatory Visit | Attending: Obstetrics and Gynecology | Admitting: Obstetrics and Gynecology

## 2019-10-20 ENCOUNTER — Other Ambulatory Visit: Payer: Self-pay

## 2019-10-20 DIAGNOSIS — N6001 Solitary cyst of right breast: Secondary | ICD-10-CM

## 2019-10-27 ENCOUNTER — Other Ambulatory Visit: Payer: Managed Care, Other (non HMO)

## 2019-12-22 ENCOUNTER — Other Ambulatory Visit: Payer: Self-pay | Admitting: Family Medicine

## 2020-01-20 ENCOUNTER — Ambulatory Visit (INDEPENDENT_AMBULATORY_CARE_PROVIDER_SITE_OTHER): Payer: Managed Care, Other (non HMO)

## 2020-01-20 ENCOUNTER — Other Ambulatory Visit: Payer: Self-pay

## 2020-01-20 ENCOUNTER — Encounter: Payer: Self-pay | Admitting: Family Medicine

## 2020-01-20 DIAGNOSIS — Z23 Encounter for immunization: Secondary | ICD-10-CM | POA: Diagnosis not present

## 2020-01-22 ENCOUNTER — Other Ambulatory Visit: Payer: Self-pay | Admitting: Family Medicine

## 2020-03-04 ENCOUNTER — Ambulatory Visit (INDEPENDENT_AMBULATORY_CARE_PROVIDER_SITE_OTHER): Payer: Managed Care, Other (non HMO)

## 2020-03-04 ENCOUNTER — Ambulatory Visit (INDEPENDENT_AMBULATORY_CARE_PROVIDER_SITE_OTHER): Payer: Managed Care, Other (non HMO) | Admitting: Podiatry

## 2020-03-04 ENCOUNTER — Encounter: Payer: Self-pay | Admitting: Podiatry

## 2020-03-04 ENCOUNTER — Other Ambulatory Visit: Payer: Self-pay

## 2020-03-04 DIAGNOSIS — M722 Plantar fascial fibromatosis: Secondary | ICD-10-CM

## 2020-03-04 DIAGNOSIS — M7671 Peroneal tendinitis, right leg: Secondary | ICD-10-CM | POA: Diagnosis not present

## 2020-03-04 DIAGNOSIS — M7751 Other enthesopathy of right foot: Secondary | ICD-10-CM | POA: Diagnosis not present

## 2020-03-04 MED ORDER — DICLOFENAC SODIUM 75 MG PO TBEC: 75.0000 mg | DELAYED_RELEASE_TABLET | Freq: Two times a day (BID) | ORAL | 2 refills | Status: DC

## 2020-03-05 NOTE — Progress Notes (Signed)
Subjective:   Patient ID: Samantha Pena, female   DOB: 54 y.o.   MRN: [id number]   HPI Patient presents stating she is having a lot of pain in the outside of her right foot does not remember injury.  States it has been hurting for around 3 months and making it hard to walk comfortably and her other problem on her left foot is doing great knee   ROS      Objective:  Physical Exam  Neurovascular status intact with patient's right peroneal insertion base of fifth metatarsal inflamed with swelling and fluid buildup around the area.  No indication muscle dysfunction in the left heel is doing well currently     Assessment:  Peroneal tendinitis right along with continued improved plantar fasciitis left     Plan:  H&P work in a focus on right foot and discussed x-rays.  Today I did sterile prep and injected the peroneal insertion 3 mg dexamethasone Kenalog 5 mg Xylocaine and applied fascial brace to lift up the lateral side of the foot gave instructions for physical therapy anti-inflammatories.  X-rays indicate no signs of bone injury associated with the inflammatory condition

## 2020-05-03 ENCOUNTER — Other Ambulatory Visit: Payer: Self-pay

## 2020-05-03 ENCOUNTER — Telehealth (INDEPENDENT_AMBULATORY_CARE_PROVIDER_SITE_OTHER): Payer: BC Managed Care – PPO | Admitting: Family Medicine

## 2020-05-03 ENCOUNTER — Encounter: Payer: Self-pay | Admitting: Family Medicine

## 2020-05-03 DIAGNOSIS — U071 COVID-19: Secondary | ICD-10-CM | POA: Diagnosis not present

## 2020-05-03 MED ORDER — TRAMADOL HCL 50 MG PO TABS: 50.0000 mg | ORAL_TABLET | Freq: Three times a day (TID) | ORAL | 0 refills | Status: AC | PRN

## 2020-05-03 NOTE — Progress Notes (Signed)
Virtual Visit via Video Note  Subjective  CC:  Chief Complaint  Patient presents with  . Covid Positive    Tested positive on Dec 31st, symptoms started last Monday. Did not receive COVID vaccine     I connected with [name] on 05/03/20 at  9:00 AM EST by a video enabled telemedicine application and verified that I am speaking with the correct person using two identifiers. Location patient: Home Location provider: [facility] [address], Office Persons participating in the virtual visit: Rhaelyn, Giron, MD Adela Glimpse CMA  Same day acute visit; PCP not available. New pt to me. Chart reviewed.   I discussed the limitations of evaluation and management by telemedicine and the availability of in person appointments. The patient expressed understanding and agreed to proceed. HPI: Samantha Pena is a 55 y.o. female who was contacted today to address the problems listed above in the chief complaint. . Started with symptoms starting a week ago with low grade fever, cough and myalgias. No SOB, rare n/v. Has moderate to severe headache. Can't get rid of it. Used motrin and now using OTC cold medicines. Eating and drinking ok. Unvaccinated status. Inquiring about ivermectin or hydrochloroquine. Her friends have been given them. No neuro or cardiac sxs.  Assessment  1. COVID-19      Plan   covid 19 infection:  1 week into illness. Stable sxs but patient with headaches. Tramadol to see if can help. Continue OTC treatments. Monitor respiratory symptoms. F/u if not improving.  To ER for SOB.  I discussed the assessment and treatment plan with the patient. The patient was provided an opportunity to ask questions and all were answered. The patient agreed with the plan and demonstrated an understanding of the instructions.   The patient was advised to call back or seek an in-person evaluation if the symptoms worsen or if the condition fails to  improve as anticipated. Follow up: No follow-ups on file.  Visit date not found  Meds ordered this encounter  Medications  . traMADol (ULTRAM) 50 MG tablet    Sig: Take 1 tablet (50 mg total) by mouth every 8 (eight) hours as needed for up to 5 days.    Dispense:  15 tablet    Refill:  0      I reviewed the patients updated PMH, FH, and SocHx.    Patient Active Problem List   Diagnosis Date Noted  . History of Lyme disease 11/12/2018  . Urge incontinence 11/12/2017  . Overweight 05/07/2017  . Allergic rhinitis 05/07/2017  . Hyperlipidemia 10/31/2016  . TMJ disease 03/17/2015  . Anal benign neoplasm 02/14/2011  . ACTINIC KERATOSIS, CHEEK, LEFT 11/13/2007  . Migraine headache 10/30/2006   Current Meds  Medication Sig  . Calcium Carbonate (CHEWABLE CALCIUM PO) Take by mouth daily.  . diclofenac (VOLTAREN) 75 MG EC tablet Take 1 tablet (75 mg total) by mouth 2 (two) times daily.  . fluticasone (FLONASE) 50 MCG/ACT nasal spray INSTIL 2 SPRAYS IN EACH NOSTRIL ONCE DAILY  . Ibuprofen (MOTRIN PO) Take by mouth as needed.  Marland Kitchen levocetirizine (XYZAL) 5 MG tablet Take 5 mg by mouth every evening.  . montelukast (SINGULAIR) 10 MG tablet Take 10 mg by mouth at bedtime.  . Multiple Vitamins-Minerals (MULTI FOR HER PO) Take by mouth daily.  Marland Kitchen oxybutynin (DITROPAN-XL) 10 MG 24 hr tablet Take 10 mg by mouth at bedtime.  . SUMAtriptan (IMITREX)  20 MG/ACT nasal spray USE 1 SPRAY INTO EACH NOSTRIL EVERY 2 HOURS AS NEEDED AND DIRECTED BY DR  . tolterodine (DETROL LA) 4 MG 24 hr capsule Take by mouth daily.  . traMADol (ULTRAM) 50 MG tablet Take 1 tablet (50 mg total) by mouth every 8 (eight) hours as needed for up to 5 days.  Marland Kitchen tretinoin (RETIN-A) 0.025 % cream APPLY ON THE SKIN AT BEDTIME ** FOR COSMETIC USAGE**    Allergies: Patient is allergic to no known allergies. Family History: Patient family history includes Breast cancer (age of onset: 38) in her sister; Breast cancer (age of onset:  31) in her mother; Cancer (age of onset: 93) in her mother; Diabetes in her father; Epilepsy in her sister; Hyperlipidemia in her brother; Hypertension in her father. Social History:  Patient  reports that she has never smoked. She has never used smokeless tobacco. She reports current alcohol use. She reports that she does not use drugs.  Review of Systems: Constitutional: +for fever malaise no anorexia Cardiovascular: negative for chest pain Respiratory: negative for SOB or persistent cough, has mild cough Gastrointestinal: negative for abdominal pain, occ n/v. occ diarrhea.   OBJECTIVE Vitals: There were no vitals taken for this visit. General: no acute distress , A&Ox3 No respiratory distress  Leamon Arnt, MD

## 2020-06-01 ENCOUNTER — Other Ambulatory Visit: Payer: Self-pay | Admitting: Family Medicine

## 2020-06-09 ENCOUNTER — Other Ambulatory Visit: Payer: Self-pay | Admitting: Podiatry

## 2020-06-09 DIAGNOSIS — M7751 Other enthesopathy of right foot: Secondary | ICD-10-CM

## 2020-09-10 ENCOUNTER — Other Ambulatory Visit: Payer: Self-pay | Admitting: Obstetrics and Gynecology

## 2020-09-10 DIAGNOSIS — N6310 Unspecified lump in the right breast, unspecified quadrant: Secondary | ICD-10-CM

## 2020-10-08 ENCOUNTER — Other Ambulatory Visit: Payer: Self-pay

## 2020-10-08 ENCOUNTER — Encounter (HOSPITAL_COMMUNITY)
Admission: EM | Disposition: A | Discharge status: Home / Self Care | Payer: Self-pay | Source: Home / Self Care | Attending: Emergency Medicine

## 2020-10-08 ENCOUNTER — Observation Stay (HOSPITAL_BASED_OUTPATIENT_CLINIC_OR_DEPARTMENT_OTHER)
Admission: EM | Admit: 2020-10-08 | Discharge: 2020-10-10 | Disposition: A | Discharge status: Home / Self Care | Payer: BC Managed Care – PPO | Attending: General Surgery | Admitting: General Surgery

## 2020-10-08 ENCOUNTER — Emergency Department (HOSPITAL_COMMUNITY): Payer: BC Managed Care – PPO | Admitting: Anesthesiology

## 2020-10-08 ENCOUNTER — Emergency Department (HOSPITAL_BASED_OUTPATIENT_CLINIC_OR_DEPARTMENT_OTHER): Payer: BC Managed Care – PPO

## 2020-10-08 ENCOUNTER — Telehealth: Payer: Self-pay

## 2020-10-08 ENCOUNTER — Encounter (HOSPITAL_BASED_OUTPATIENT_CLINIC_OR_DEPARTMENT_OTHER): Payer: Self-pay | Admitting: *Deleted

## 2020-10-08 DIAGNOSIS — Z20822 Contact with and (suspected) exposure to covid-19: Secondary | ICD-10-CM | POA: Insufficient documentation

## 2020-10-08 DIAGNOSIS — K353 Acute appendicitis with localized peritonitis, without perforation or gangrene: Principal | ICD-10-CM | POA: Insufficient documentation

## 2020-10-08 DIAGNOSIS — K3580 Unspecified acute appendicitis: Secondary | ICD-10-CM | POA: Diagnosis not present

## 2020-10-08 DIAGNOSIS — J309 Allergic rhinitis, unspecified: Secondary | ICD-10-CM | POA: Diagnosis not present

## 2020-10-08 DIAGNOSIS — R1031 Right lower quadrant pain: Secondary | ICD-10-CM | POA: Diagnosis not present

## 2020-10-08 DIAGNOSIS — E785 Hyperlipidemia, unspecified: Secondary | ICD-10-CM | POA: Diagnosis not present

## 2020-10-08 DIAGNOSIS — F419 Anxiety disorder, unspecified: Secondary | ICD-10-CM | POA: Diagnosis not present

## 2020-10-08 DIAGNOSIS — R109 Unspecified abdominal pain: Secondary | ICD-10-CM | POA: Diagnosis not present

## 2020-10-08 DIAGNOSIS — R1110 Vomiting, unspecified: Secondary | ICD-10-CM | POA: Diagnosis not present

## 2020-10-08 DIAGNOSIS — Z9049 Acquired absence of other specified parts of digestive tract: Secondary | ICD-10-CM

## 2020-10-08 HISTORY — PX: LAPAROSCOPIC APPENDECTOMY: SHX408

## 2020-10-08 LAB — CBC WITH DIFFERENTIAL/PLATELET
Abs Immature Granulocytes: 0.06 10*3/uL (ref 0.00–0.07)
Basophils Absolute: 0 10*3/uL (ref 0.0–0.1)
Basophils Relative: 0 %
Eosinophils Absolute: 0 10*3/uL (ref 0.0–0.5)
Eosinophils Relative: 0 %
HCT: 41.3 % (ref 36.0–46.0)
Hemoglobin: 13.7 g/dL (ref 12.0–15.0)
Immature Granulocytes: 0 %
Lymphocytes Relative: 7 %
Lymphs Abs: 1.1 10*3/uL (ref 0.7–4.0)
MCH: 28.3 pg (ref 26.0–34.0)
MCHC: 33.2 g/dL (ref 30.0–36.0)
MCV: 85.3 fL (ref 80.0–100.0)
Monocytes Absolute: 0.6 10*3/uL (ref 0.1–1.0)
Monocytes Relative: 4 %
Neutro Abs: 14.4 10*3/uL — ABNORMAL HIGH (ref 1.7–7.7)
Neutrophils Relative %: 89 %
Platelets: 265 10*3/uL (ref 150–400)
RBC: 4.84 MIL/uL (ref 3.87–5.11)
RDW: 13.8 % (ref 11.5–15.5)
WBC: 16.1 10*3/uL — ABNORMAL HIGH (ref 4.0–10.5)
nRBC: 0 % (ref 0.0–0.2)

## 2020-10-08 LAB — COMPREHENSIVE METABOLIC PANEL
ALT: 6 U/L (ref 0–44)
AST: 11 U/L — ABNORMAL LOW (ref 15–41)
Albumin: 4.5 g/dL (ref 3.5–5.0)
Alkaline Phosphatase: 37 U/L — ABNORMAL LOW (ref 38–126)
Anion gap: 15 (ref 5–15)
BUN: 7 mg/dL (ref 6–20)
CO2: 22 mmol/L (ref 22–32)
Calcium: 9.6 mg/dL (ref 8.9–10.3)
Chloride: 101 mmol/L (ref 98–111)
Creatinine, Ser: 0.93 mg/dL (ref 0.44–1.00)
GFR, Estimated: >60 mL/min (ref >60)
Glucose, Bld: 138 mg/dL — ABNORMAL HIGH (ref 70–99)
Potassium: 3.8 mmol/L (ref 3.5–5.1)
Sodium: 138 mmol/L (ref 135–145)
Total Bilirubin: 1 mg/dL (ref 0.3–1.2)
Total Protein: 7.7 g/dL (ref 6.5–8.1)

## 2020-10-08 LAB — URINALYSIS, ROUTINE W REFLEX MICROSCOPIC
Bilirubin Urine: NEGATIVE
Glucose, UA: NEGATIVE mg/dL
Hgb urine dipstick: NEGATIVE
Ketones, ur: 15 mg/dL — AB
Leukocytes,Ua: NEGATIVE
Nitrite: NEGATIVE
Protein, ur: NEGATIVE mg/dL
Specific Gravity, Urine: 1.036 — ABNORMAL HIGH (ref 1.005–1.030)
pH: 5.5 (ref 5.0–8.0)

## 2020-10-08 LAB — RESP PANEL BY RT-PCR (FLU A&B, COVID) ARPGX2
Influenza A by PCR: NEGATIVE
Influenza B by PCR: NEGATIVE
SARS Coronavirus 2 by RT PCR: NEGATIVE

## 2020-10-08 LAB — LIPASE, BLOOD: Lipase: 13 U/L (ref 11–51)

## 2020-10-08 LAB — LACTIC ACID, PLASMA: Lactic Acid, Venous: 0.8 mmol/L (ref 0.5–1.9)

## 2020-10-08 SURGERY — APPENDECTOMY, LAPAROSCOPIC
Anesthesia: General

## 2020-10-08 MED ORDER — FENTANYL CITRATE (PF) 100 MCG/2ML IJ SOLN
25.0000 ug | INTRAMUSCULAR | Status: DC | PRN
  Administered 2020-10-08 (×2): 50 ug via INTRAVENOUS

## 2020-10-08 MED ORDER — LACTATED RINGERS IV SOLN: INTRAVENOUS | Status: DC | PRN

## 2020-10-08 MED ORDER — SCOPOLAMINE 1 MG/3DAYS TD PT72
MEDICATED_PATCH | TRANSDERMAL | Status: DC | PRN
  Administered 2020-10-08: 1 via TRANSDERMAL

## 2020-10-08 MED ORDER — PROMETHAZINE HCL 25 MG/ML IJ SOLN
INTRAMUSCULAR | Status: AC
  Administered 2020-10-08: 25 mg
  Filled 2020-10-08: qty 1

## 2020-10-08 MED ORDER — PIPERACILLIN-TAZOBACTAM 3.375 G IVPB 30 MIN
3.3750 g | Freq: Once | INTRAVENOUS | Status: AC
  Administered 2020-10-08: 3.375 g via INTRAVENOUS
  Filled 2020-10-08: qty 50

## 2020-10-08 MED ORDER — ACETAMINOPHEN 10 MG/ML IV SOLN
INTRAVENOUS | Status: DC | PRN
  Administered 2020-10-08: 1000 mg via INTRAVENOUS

## 2020-10-08 MED ORDER — MIDAZOLAM HCL 2 MG/2ML IJ SOLN
INTRAMUSCULAR | Status: DC | PRN
  Administered 2020-10-08: 2 mg via INTRAVENOUS

## 2020-10-08 MED ORDER — ACETAMINOPHEN 10 MG/ML IV SOLN: 1000.0000 mg | Freq: Once | INTRAVENOUS | Status: DC | PRN

## 2020-10-08 MED ORDER — SODIUM CHLORIDE 0.9 % IV SOLN
25.0000 mg | Freq: Once | INTRAVENOUS | Status: AC
  Administered 2020-10-08: 25 mg via INTRAVENOUS
  Filled 2020-10-08: qty 1

## 2020-10-08 MED ORDER — FENTANYL CITRATE (PF) 100 MCG/2ML IJ SOLN
INTRAMUSCULAR | Status: AC
  Administered 2020-10-08: 50 ug
  Filled 2020-10-08: qty 2

## 2020-10-08 MED ORDER — BUPIVACAINE-EPINEPHRINE 0.25% -1:200000 IJ SOLN
INTRAMUSCULAR | Status: DC | PRN
  Administered 2020-10-08: 6 mL

## 2020-10-08 MED ORDER — ONDANSETRON HCL 4 MG/2ML IJ SOLN
INTRAMUSCULAR | Status: DC | PRN
  Administered 2020-10-08: 4 mg via INTRAVENOUS

## 2020-10-08 MED ORDER — SUGAMMADEX SODIUM 200 MG/2ML IV SOLN
INTRAVENOUS | Status: DC | PRN
  Administered 2020-10-08: 200 mg via INTRAVENOUS

## 2020-10-08 MED ORDER — PROPOFOL 10 MG/ML IV BOLUS
INTRAVENOUS | Status: DC | PRN
  Administered 2020-10-08: 140 mg via INTRAVENOUS

## 2020-10-08 MED ORDER — OXYCODONE HCL 5 MG PO TABS
5.0000 mg | ORAL_TABLET | Freq: Once | ORAL | Status: DC | PRN
Start: 2020-10-08 — End: 2020-10-08

## 2020-10-08 MED ORDER — 0.9 % SODIUM CHLORIDE (POUR BTL) OPTIME
TOPICAL | Status: DC | PRN
  Administered 2020-10-08: 1000 mL

## 2020-10-08 MED ORDER — PIPERACILLIN-TAZOBACTAM 4.5 G IVPB: 4.5000 g | Freq: Once | INTRAVENOUS | Status: DC

## 2020-10-08 MED ORDER — ROCURONIUM BROMIDE 100 MG/10ML IV SOLN
INTRAVENOUS | Status: DC | PRN
  Administered 2020-10-08: 60 mg via INTRAVENOUS

## 2020-10-08 MED ORDER — SODIUM CHLORIDE 0.9 % IV BOLUS
1000.0000 mL | Freq: Once | INTRAVENOUS | Status: AC
  Administered 2020-10-08: 1000 mL via INTRAVENOUS

## 2020-10-08 MED ORDER — ACETAMINOPHEN 160 MG/5ML PO SOLN: 1000.0000 mg | Freq: Once | ORAL | Status: DC | PRN

## 2020-10-08 MED ORDER — FENTANYL CITRATE (PF) 250 MCG/5ML IJ SOLN
INTRAMUSCULAR | Status: DC | PRN
  Administered 2020-10-08 (×2): 50 ug via INTRAVENOUS
  Administered 2020-10-08 (×2): 100 ug via INTRAVENOUS

## 2020-10-08 MED ORDER — IOHEXOL 300 MG/ML  SOLN
75.0000 mL | Freq: Once | INTRAMUSCULAR | Status: AC | PRN
  Administered 2020-10-08: 75 mL via INTRAVENOUS

## 2020-10-08 MED ORDER — HYDROMORPHONE HCL 1 MG/ML IJ SOLN
1.0000 mg | Freq: Once | INTRAMUSCULAR | Status: AC
  Administered 2020-10-08: 1 mg via INTRAVENOUS
  Filled 2020-10-08: qty 1

## 2020-10-08 MED ORDER — SODIUM CHLORIDE 0.9 % IR SOLN
Status: DC | PRN
  Administered 2020-10-08: 1000 mL

## 2020-10-08 MED ORDER — ONDANSETRON HCL 4 MG/2ML IJ SOLN
4.0000 mg | Freq: Once | INTRAMUSCULAR | Status: AC
  Administered 2020-10-08: 4 mg via INTRAVENOUS
  Filled 2020-10-08: qty 2

## 2020-10-08 MED ORDER — OXYCODONE HCL 5 MG/5ML PO SOLN
5.0000 mg | Freq: Once | ORAL | Status: DC | PRN
Start: 2020-10-08 — End: 2020-10-08

## 2020-10-08 MED ORDER — LIDOCAINE HCL (CARDIAC) PF 100 MG/5ML IV SOSY
PREFILLED_SYRINGE | INTRAVENOUS | Status: DC | PRN
  Administered 2020-10-08: 60 mg via INTRATRACHEAL

## 2020-10-08 MED ORDER — ACETAMINOPHEN 500 MG PO TABS: 1000.0000 mg | ORAL_TABLET | Freq: Once | ORAL | Status: DC | PRN

## 2020-10-08 SURGICAL SUPPLY — 46 items
APPLIER CLIP 5 13 M/L LIGAMAX5 (MISCELLANEOUS)
CANISTER SUCT 3000ML PPV (MISCELLANEOUS) ×3 IMPLANT
CHLORAPREP W/TINT 26 (MISCELLANEOUS) ×3 IMPLANT
CLIP APPLIE 5 13 M/L LIGAMAX5 (MISCELLANEOUS) IMPLANT
CLOSURE STERI-STRIP 1/2X4 (GAUZE/BANDAGES/DRESSINGS) ×1
CLOSURE WOUND 1/2 X4 (GAUZE/BANDAGES/DRESSINGS) ×1
CLSR STERI-STRIP ANTIMIC 1/2X4 (GAUZE/BANDAGES/DRESSINGS) ×2 IMPLANT
COVER SURGICAL LIGHT HANDLE (MISCELLANEOUS) ×3 IMPLANT
COVER WAND RF STERILE (DRAPES) ×3 IMPLANT
CUTTER FLEX LINEAR 45M (STAPLE) ×3 IMPLANT
DERMABOND ADHESIVE PROPEN (GAUZE/BANDAGES/DRESSINGS) ×2
DERMABOND ADVANCED (GAUZE/BANDAGES/DRESSINGS) ×2
DERMABOND ADVANCED .7 DNX12 (GAUZE/BANDAGES/DRESSINGS) ×1 IMPLANT
DERMABOND ADVANCED .7 DNX6 (GAUZE/BANDAGES/DRESSINGS) ×1 IMPLANT
ELECT REM PT RETURN 9FT ADLT (ELECTROSURGICAL) ×3
ELECTRODE REM PT RTRN 9FT ADLT (ELECTROSURGICAL) ×1 IMPLANT
GLOVE BIO SURGEON STRL SZ7 (GLOVE) ×3 IMPLANT
GLOVE BIOGEL PI IND STRL 7.5 (GLOVE) ×1 IMPLANT
GLOVE BIOGEL PI INDICATOR 7.5 (GLOVE) ×2
GOWN STRL REUS W/ TWL LRG LVL3 (GOWN DISPOSABLE) ×3 IMPLANT
GOWN STRL REUS W/TWL LRG LVL3 (GOWN DISPOSABLE) ×9
GRASPER SUT TROCAR 14GX15 (MISCELLANEOUS) ×3 IMPLANT
KIT BASIN OR (CUSTOM PROCEDURE TRAY) ×3 IMPLANT
KIT TURNOVER KIT B (KITS) ×3 IMPLANT
NS IRRIG 1000ML POUR BTL (IV SOLUTION) ×3 IMPLANT
PAD ARMBOARD 7.5X6 YLW CONV (MISCELLANEOUS) ×6 IMPLANT
POUCH RETRIEVAL ECOSAC 10 (ENDOMECHANICALS) ×1 IMPLANT
POUCH RETRIEVAL ECOSAC 10MM (ENDOMECHANICALS) ×3
RELOAD 45 VASCULAR/THIN (ENDOMECHANICALS) IMPLANT
RELOAD STAPLE TA45 3.5 REG BLU (ENDOMECHANICALS) ×3 IMPLANT
SCISSORS LAP 5X35 DISP (ENDOMECHANICALS) IMPLANT
SET IRRIG TUBING LAPAROSCOPIC (IRRIGATION / IRRIGATOR) ×3 IMPLANT
SET TUBE SMOKE EVAC HIGH FLOW (TUBING) ×3 IMPLANT
SHEARS HARMONIC ACE PLUS 36CM (ENDOMECHANICALS) ×3 IMPLANT
SLEEVE ENDOPATH XCEL 5M (ENDOMECHANICALS) ×3 IMPLANT
SPECIMEN JAR SMALL (MISCELLANEOUS) ×3 IMPLANT
STRIP CLOSURE SKIN 1/2X4 (GAUZE/BANDAGES/DRESSINGS) ×2 IMPLANT
SUT MNCRL AB 4-0 PS2 18 (SUTURE) ×3 IMPLANT
SUT VICRYL 0 UR6 27IN ABS (SUTURE) ×3 IMPLANT
TOWEL GREEN STERILE (TOWEL DISPOSABLE) ×3 IMPLANT
TOWEL GREEN STERILE FF (TOWEL DISPOSABLE) ×3 IMPLANT
TRAY FOLEY MTR SLVR 16FR STAT (SET/KITS/TRAYS/PACK) IMPLANT
TRAY LAPAROSCOPIC MC (CUSTOM PROCEDURE TRAY) ×3 IMPLANT
TROCAR XCEL BLUNT TIP 100MML (ENDOMECHANICALS) ×3 IMPLANT
TROCAR XCEL NON-BLD 5MMX100MML (ENDOMECHANICALS) ×3 IMPLANT
WATER STERILE IRR 1000ML POUR (IV SOLUTION) ×3 IMPLANT

## 2020-10-08 NOTE — ED Notes (Signed)
[facility] advised they are on the way to transport patient to [facility] Emergency

## 2020-10-08 NOTE — Op Note (Signed)
Preoperative diagnosis: Acute appendicitis Postoperative diagnosis: Acute suppurative appendicitis Procedure: Laparoscopic appendectomy Surgeon: Dr. Serita Grammes Anesthesia: General Estimated blood loss: 10 cc Drains: None Specimens: Appendix to pathology Complications: None Sponge and counts correct completion Disposition recovery stable condition  Indications: 42 yof with history nl csc presents with lower ab pain primarily on right since yesterday. This has gotten worse. Nothing she was doing at home was helping. Some emesis. No fevers, having bowel function.  She went to outside er and was noted to have elevated wbc and ct with appendicitis.we discussed lap appy.   Procedure: After informed consent was obtained she was taken to the operating room.  She had already been given antibiotics SCDs were in place.  She was placed under general anesthesia. SHe was then prepped and draped in a standard sterile surgical fashion.  Surgical timeout was then performed  I infiltrated Marcaine below her umbilicus.  I made a vertical incision and carried this to his fascia.  I grasped the fascia with Kocher clamps.  I incised this sharply and entered the peritoneum bluntly.  There was no evidence of an entry injury.  I placed a 0 Vicryl pursestring suture through the fascia.  I inserted a Hassan trocar and insufflated the abdomen to 15 mmHg pressure.  I then inserted 2 5 mm trochars in the suprapubic region and left lower quadrant.  I then was able to identify the cecum and terminal ileum.  there was purulent fluid in the gutter and the appendix was very inflamed.  I was able to identify the base and terminal ileum. I divided the mesentery with the harmonic scalpel to the base which was viable.  I then divided the appendix at its base with a GIA stapler.  I placed this in a retrieval bagand removed it from the abdomen.  I then irrigated.This appeared to be hemostatic upon completion.  I then removed my Hassan  trocar.  I tied my pursestring down.  I used the suture passer to place an additional 0 Vicryl suture.  I then remove the remaining trocars and desufflated the abdomen.  These were closed with 4 Monocryl and glue.  She tolerated this well was extubated and transferred to recovery.

## 2020-10-08 NOTE — ED Notes (Signed)
[facility] at bedside 

## 2020-10-08 NOTE — Anesthesia Procedure Notes (Signed)
Procedure Name: Intubation Date/Time: 10/08/2020 9:36 PM Performed by: Clovis Cao, CRNA Pre-anesthesia Checklist: Patient identified, Emergency Drugs available, Suction available, Patient being monitored and Timeout performed Patient Re-evaluated:Patient Re-evaluated prior to induction Oxygen Delivery Method: Circle system utilized Preoxygenation: Pre-oxygenation with 100% oxygen Induction Type: IV induction Ventilation: Mask ventilation without difficulty Laryngoscope Size: Miller and 2 Grade View: Grade I Tube type: Oral Tube size: 7.0 mm Number of attempts: 1 Airway Equipment and Method: Stylet and Oral airway Placement Confirmation: ETT inserted through vocal cords under direct vision, positive ETCO2 and breath sounds checked- equal and bilateral Tube secured with: Tape Dental Injury: Teeth and Oropharynx as per pre-operative assessment

## 2020-10-08 NOTE — Transfer of Care (Signed)
Immediate Anesthesia Transfer of Care Note  Patient: Samantha Pena  Procedure(s) Performed: APPENDECTOMY LAPAROSCOPIC  Patient Location: PACU  Anesthesia Type:General  Level of Consciousness: drowsy  Airway & Oxygen Therapy: Patient Spontanous Breathing  Post-op Assessment: Report given to RN and Post -op Vital signs reviewed and stable  Post vital signs: Reviewed and stable  Last Vitals:  Vitals Value Taken Time  BP    Temp    Pulse 95 10/08/20 2231  Resp 17 10/08/20 2231  SpO2 98 % 10/08/20 2231  Vitals shown include unvalidated device data.  Last Pain:  Vitals:   10/08/20 2040  TempSrc: Oral  PainSc:          Complications: No notable events documented.

## 2020-10-08 NOTE — Progress Notes (Signed)
Pt vomiting, and feeling hot and light headed upon arrival to CT. RN notified and pt returned to ER12, will await RN's notification to re-attempt CT.

## 2020-10-08 NOTE — Telephone Encounter (Signed)
FYI; Lvm for pt to schedule a f/u with Dr. Yong Channel.

## 2020-10-08 NOTE — Telephone Encounter (Signed)
Nurse Assessment Nurse: Alveta Heimlich, RN, Rise Paganini Date/Time (Eastern Time): 10/08/2020 10:19:14 AM Confirm and document reason for call. If symptomatic, describe symptoms. ---Caller states on the right side , she is having shooting pains from ribs to groin and on the left it starts at the belly button and goes down to groin. She is going to the bathroom soft , not like diarrhea. Her urine is dark. No burning. Does the patient have any new or worsening symptoms? ---Yes Will a triage be completed? ---Yes Related visit to physician within the last 2 weeks? ---No Does the PT have any chronic conditions? (i.e. diabetes, asthma, this includes High risk factors for pregnancy, etc.) ---Yes List chronic conditions. ---migraines Is the patient pregnant or possibly pregnant? (Ask all females between the ages of 32-55) ---No Is this a behavioral health or substance abuse call? ---No Guidelines Guideline Title Affirmed Question Affirmed Notes Nurse Date/Time Eilene Ghazi Time) Abdominal Pain - Upper [1] SEVERE pain (e.g., excruciating) Alveta Heimlich, RN, Rise Paganini 10/08/2020 10:24:39 AM PLEASE NOTE: All timestamps contained within this report are represented as [place] Standard Time. CONFIDENTIALTY NOTICE: This fax transmission is intended only for the addressee. It contains information that is legally privileged, confidential or otherwise protected from use or disclosure. If you are not the intended recipient, you are strictly prohibited from reviewing, disclosing, copying using or disseminating any of this information or taking any action in reliance on or regarding this information. If you have received this fax in error, please notify us immediately by telephone so that we can arrange for its return to [place]. Phone: [phone], Toll-Free: [phone], Fax: [phone] Page: 2 of 2 Call Id: [id number] Guidelines Guideline Title Affirmed Question Affirmed Notes Nurse Date/Time Eilene Ghazi Time) AND [2] present >  1 hour Disp. Time Eilene Ghazi Time) Disposition Final User 10/08/2020 10:18:34 AM Send to Urgent [facility] 10/08/2020 10:25:22 AM Go to ED Now Yes Alveta Heimlich, RN, Ali Lowe Disagree/Comply Comply Caller Understands Yes PreDisposition Call Doctor Care Advice Given Per Guideline GO TO ED NOW: * Another adult should drive. NOTHING BY MOUTH: CARE ADVICE given per Abdominal Pain, Upper (Adult) guideline. Referrals GO TO FACILITY UNDECIDE

## 2020-10-08 NOTE — ED Provider Notes (Addendum)
Samantha Pena Provider Note   CSN: [id number] Arrival date & time: 10/08/20  1414     History Chief Complaint  Patient presents with   Abdominal Pain    Samantha Pena is a 55 y.o. female.  The history is provided by the patient.  Abdominal Pain Pain location:  Generalized Pain quality: cramping   Pain radiates to:  Does not radiate Pain severity:  Severe Onset quality:  Gradual Duration:  2 days Timing:  Constant Progression:  Worsening Chronicity:  New Context: not previous surgeries, not sick contacts, not suspicious food intake and not trauma   Relieved by:  Nothing Worsened by:  Movement Ineffective treatments:  Acetaminophen Associated symptoms: nausea and vomiting   Associated symptoms: no chest pain, no chills, no constipation, no cough, no diarrhea, no dysuria, no fever, no hematuria, no shortness of breath and no sore throat       Past Medical History:  Diagnosis Date   Anxiety    Cyst October 2012   2016 stable. 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   ACTINIC KERATOSIS, CHEEK, LEFT 11/13/2007   Migraine headache 10/30/2006    Past Surgical History:  Procedure Laterality Date   CESAREAN SECTION  03/18/1999   FOOT SURGERY  09/2002   bone in toe had to be shaved and realigned      OB History     Gravida  2   Para  2   Term      Preterm      AB      Living         SAB      IAB      Ectopic      Multiple      Live Births              Family History  Problem Relation Age of Onset   Cancer Mother 26       breast cancer - passed from   Breast cancer Mother 73    Diabetes Father        89s   Hypertension Father    Breast cancer Sister 12   Epilepsy Sister    Hyperlipidemia Brother    Colon cancer Neg Hx    Esophageal cancer Neg Hx    Rectal cancer Neg Hx    Stomach cancer Neg Hx     Social History   Tobacco Use   Smoking status: Never   Smokeless tobacco: Never  Vaping Use   Vaping Use: Never used  Substance Use Topics   Alcohol use: Yes    Alcohol/week: 0.0 standard drinks    Comment: rare/social   Drug use: No    Home Medications Prior to Admission medications   Medication Sig Start Date End Date Taking? Authorizing Provider  Calcium Carbonate (CHEWABLE CALCIUM PO) Take by mouth daily.   Yes [provider]  fluticasone (FLONASE) 50 MCG/ACT nasal spray INSTIL 2 SPRAYS IN EACH NOSTRIL ONCE DAILY 04/23/19  Yes Marin Olp, MD  Ibuprofen (MOTRIN PO) Take by mouth as needed.   Yes [provider]  Multiple Vitamins-Minerals (MULTI FOR HER PO) Take by mouth daily.   Yes [provider]  oxybutynin (DITROPAN-XL) 10 MG 24 hr tablet Take 10 mg by mouth at bedtime.   Yes [provider]  SUMAtriptan (IMITREX) 20 MG/ACT nasal spray USE 1 SPRAY INTO EACH NOSTRIL EVERY 2 HOURS AS NEEDED AND DIRECTED BY DR 06/01/20  Yes Marin Olp, MD  levocetirizine (XYZAL) 5 MG tablet Take 5 mg by mouth every evening.    [provider]  montelukast (SINGULAIR) 10 MG tablet Take 10 mg by mouth at bedtime.    [provider]  tretinoin (RETIN-A) 0.025 % cream APPLY ON THE SKIN AT BEDTIME ** FOR COSMETIC USAGE** 05/12/19   [provider]    Allergies    No known allergies  Review of Systems   Review of Systems  Constitutional:  Negative for chills and fever.  HENT:  Negative for ear pain and sore throat.   Eyes:  Negative for pain and visual disturbance.  Respiratory:  Negative for cough and shortness of breath.   Cardiovascular:  Negative for chest pain and palpitations.   Gastrointestinal:  Positive for abdominal pain, nausea and vomiting. Negative for constipation and diarrhea.  Genitourinary:  Negative for dysuria and hematuria.  Musculoskeletal:  Negative for arthralgias and back pain.  Skin:  Negative for color change and rash.  Neurological:  Negative for seizures and syncope.  All other systems reviewed and are negative.  Physical Exam Updated Vital Signs BP 105/71 (BP Location: Left Arm)   Pulse (!) 111   Temp 99.1 F (37.3 C) (Oral)   Resp 16   Ht 5\' 6"  (1.676 m)   Wt 77.1 kg   LMP 08/13/2020   SpO2 98%   BMI 27.44 kg/m   Physical Exam Vitals and nursing note reviewed.  Constitutional:      General: She is in acute distress (appears to be in pain).     Appearance: She is well-developed.  HENT:     Head: Normocephalic and atraumatic.  Eyes:     Conjunctiva/sclera: Conjunctivae normal.  Cardiovascular:     Rate and Rhythm: Normal rate and regular rhythm.     Heart sounds: No murmur heard. Pulmonary:     Effort: Pulmonary effort is normal. No respiratory distress.     Breath sounds: Normal breath sounds.  Abdominal:     General: Bowel sounds are increased.     Palpations: Abdomen is soft.     Tenderness: There is generalized abdominal tenderness. There is guarding.  Musculoskeletal:     Cervical back: Neck supple.  Skin:    General: Skin is warm and dry.  Neurological:     General: No focal deficit present.     Mental Status: She is alert.    ED Results / Procedures / Treatments   Labs (all labs ordered are listed, but only abnormal results are displayed) Labs Reviewed  CBC WITH DIFFERENTIAL/PLATELET - Abnormal; Notable for the following components:      Result Value   WBC 16.1 (*)    Neutro Abs 14.4 (*)    All other components within normal limits  COMPREHENSIVE METABOLIC PANEL - Abnormal; Notable for the following components:   Glucose, Bld 138 (*)    AST 11 (*)    Alkaline Phosphatase 37 (*)    All other  components within normal limits  RESP PANEL BY RT-PCR (FLU A&B, COVID) ARPGX2  LIPASE, BLOOD  LACTIC ACID, PLASMA  URINALYSIS, ROUTINE  W REFLEX MICROSCOPIC    EKG None  Radiology CT Abdomen Pelvis W Contrast  Result Date: 10/08/2020 CLINICAL DATA:  Right lower quadrant abdominal pain since yesterday afternoon. One episode of vomiting this morning. EXAM: CT ABDOMEN AND PELVIS WITH CONTRAST TECHNIQUE: Multidetector CT imaging of the abdomen and pelvis was performed using the standard protocol following bolus administration of intravenous contrast. CONTRAST:  40mL OMNIPAQUE IOHEXOL 300 MG/ML  SOLN COMPARISON:  None. FINDINGS: Lower chest: Mild bilateral dependent atelectasis. Normal sized heart. Small hiatal hernia. Hepatobiliary: Small left lobe liver cyst. Normal appearing gallbladder. Pancreas: Unremarkable. No pancreatic ductal dilatation or surrounding inflammatory changes. Spleen: Normal in size without focal abnormality. Adrenals/Urinary Tract: Adrenal glands are unremarkable. Kidneys are normal, without renal calculi, focal lesion, or hydronephrosis. Bladder is unremarkable. Stomach/Bowel: Dilated, fluid-filled appendix with mild diffuse wall thickening and enhancement as well as periappendiceal soft tissue stranding. The maximum appendiceal diameter is 10.5 mm. The base of the appendix is in the upper right pelvis and the tip of the appendix is in the upper right pelvis to the right of midline. Small hiatal hernia. Mild colonic fat density wall thickening. Unremarkable small bowel. Vascular/Lymphatic: No significant vascular findings are present. No enlarged abdominal or pelvic lymph nodes. Reproductive: Enlarged and heterogeneous uterus containing multiple masses. The largest mass is on the left, measuring 7.6 x 6.5 cm in the axial plane and 6.5 cm in length. This is displacing the uterus posteriorly and laterally to the right. Unremarkable ovaries. Other: No abdominal wall hernia or abnormality.  No abdominopelvic ascites. Musculoskeletal: Mild lumbar and lower thoracic spine degenerative changes. IMPRESSION: 1. Acute appendicitis without abscess. 2. Fibroid uterus. 3. Small hiatal hernia. Electronically Signed   By: Claudie Revering M.D.   On: 10/08/2020 18:43    Procedures Procedures   Medications Ordered in ED Medications  HYDROmorphone (DILAUDID) injection 1 mg (1 mg Intravenous Given 10/08/20 1538)  ondansetron (ZOFRAN) injection 4 mg (4 mg Intravenous Given 10/08/20 1538)  sodium chloride 0.9 % bolus 1,000 mL (0 mLs Intravenous Stopped 10/08/20 1643)  iohexol (OMNIPAQUE) 300 MG/ML solution 75 mL (75 mLs Intravenous Contrast Given 10/08/20 1647)  promethazine (PHENERGAN) 25 mg in sodium chloride 0.9 % 50 mL IVPB (25 mg Intravenous New Bag/Given 10/08/20 1733)  promethazine (PHENERGAN) 25 MG/ML injection (25 mg  Given 10/08/20 1728)    ED Course  I have reviewed the triage vital signs and the nursing notes.  Pertinent labs & imaging results that were available during my care of the patient were reviewed by me and considered in my medical decision making (see chart for details).  Clinical Course as of 10/08/20 1931  Fri Oct 08, 2020  1924 I spoke with Dr. Donne Hazel who will accept the patient as a transfer. [AW]  1930 I spoke with Dr. Maryan Rued who will accept the patient for transfer to the ED while the patient awaits surgery. [AW]    Clinical Course User Index [AW] Arnaldo Natal, MD   MDM Rules/Calculators/A&P                          Boykin Nearing presented with diffuse abdominal pain, nausea, and vomiting.  She was noted to be tachycardic here in the emergency department and was given IV fluids as well as antiemetics and pain medication.  Evaluation in the ED was consistent with acute appendicitis.  She will be given antibiotics and transferred for surgical intervention. Final Clinical Impression(s) / ED Diagnoses  Final diagnoses:  Acute appendicitis with localized  peritonitis, without perforation, abscess, or gangrene    Rx / DC Orders ED Discharge Orders     None        Arnaldo Natal, MD 10/08/20 Remus Loffler    Arnaldo Natal, MD 10/08/20 1932

## 2020-10-08 NOTE — Anesthesia Preprocedure Evaluation (Addendum)
Anesthesia Evaluation  Patient identified by MRN, date of birth, ID band Patient awake    Reviewed: Allergy & Precautions, NPO status , Patient's Chart, lab work & pertinent test results  History of Anesthesia Complications Negative for: history of anesthetic complications  Airway Mallampati: II  TM Distance: >3 FB Neck ROM: Full    Dental  (+) Dental Advisory Given, Teeth Intact   Pulmonary  Covid-19 Nucleic Acid Test Results Lab Results      Component                Value               Date                      [place]              NEGATIVE            10/08/2020              breath sounds clear to auscultation       Cardiovascular negative cardio ROS   Rhythm:Regular     Neuro/Psych  Headaches, PSYCHIATRIC DISORDERS Anxiety    GI/Hepatic negative GI ROS, Neg liver ROS,   Endo/Other  negative endocrine ROS  Renal/GU negative Renal ROS     Musculoskeletal   Abdominal   Peds  Hematology negative hematology ROS (+)   Anesthesia Other Findings   Reproductive/Obstetrics No results found for: PREGTESTUR, PREGSERUM, HCG, HCGQUANT                             Anesthesia Physical Anesthesia Plan  ASA: 1  Anesthesia Plan: General   Post-op Pain Management:    Induction: Intravenous, Rapid sequence and Cricoid pressure planned  PONV Risk Score and Plan: 3 and Ondansetron and Dexamethasone  Airway Management Planned: Oral ETT  Additional Equipment: None  Intra-op Plan:   Post-operative Plan: Extubation in OR  Informed Consent: I have reviewed the patients History and Physical, chart, labs and discussed the procedure including the risks, benefits and alternatives for the proposed anesthesia with the patient or authorized representative who has indicated his/her understanding and acceptance.     Dental advisory given  Plan Discussed with: CRNA and Surgeon  Anesthesia Plan  Comments:         Anesthesia Quick Evaluation

## 2020-10-08 NOTE — ED Notes (Signed)
Handoff report given to [facility];  Report given to [facility] ED charge nurse Claiborne Billings RN

## 2020-10-08 NOTE — ED Notes (Signed)
States developed abdominal pain onset yesterday mostly side side radiating down right side.  Also tender left lower quadrant.  Vomited once.  Denies diarrhea.

## 2020-10-08 NOTE — ED Triage Notes (Signed)
Abdominal pain yesterday afternoon and vomited x 1 this morning and last night she broke out in sweat.

## 2020-10-08 NOTE — H&P (Signed)
Samantha Pena is an 55 y.o. female.   Chief Complaint: ab pain HPI: 82 yof with history nl csc presents with lower ab pain primarily on right since yesterday. This has gotten worse. Nothing she was doing at home was helping. Some emesis. No fevers, having bowel function.  She went to outside er and was noted to have elevated wbc and ct with appendicitis. She was transferred here for care.   Past Medical History:  Diagnosis Date   Anxiety    Cyst October 2012   2016 stable. near rectum that gives pt discomfort when sitting and has grown in size in the past year    Migraines     Past Surgical History:  Procedure Laterality Date   CESAREAN SECTION  03/18/1999   FOOT SURGERY  09/2002   bone in toe had to be shaved and realigned     Family History  Problem Relation Age of Onset   Cancer Mother 48       breast cancer - passed from   Breast cancer Mother 62   Diabetes Father        76s   Hypertension Father    Breast cancer Sister 38   Epilepsy Sister    Hyperlipidemia Brother    Colon cancer Neg Hx    Esophageal cancer Neg Hx    Rectal cancer Neg Hx    Stomach cancer Neg Hx    Social History:  reports that she has never smoked. She has never used smokeless tobacco. She reports current alcohol use. She reports that she does not use drugs.  Allergies:  Allergies  Allergen Reactions   No Known Allergies     Meds reviewed   Results for orders placed or performed during the hospital encounter of 10/08/20 (from the past 48 hour(s))  CBC with Differential     Status: Abnormal   Collection Time: 10/08/20  2:40 PM  Result Value Ref Range   WBC 16.1 (H) 4.0 - 10.5 K/uL   RBC 4.84 3.87 - 5.11 MIL/uL   Hemoglobin 13.7 12.0 - 15.0 g/dL   HCT 41.3 36.0 - 46.0 %   MCV 85.3 80.0 - 100.0 fL   MCH 28.3 26.0 - 34.0 pg   MCHC 33.2 30.0 - 36.0 g/dL   RDW 13.8 11.5 - 15.5 %   Platelets 265 150 - 400 K/uL   nRBC 0.0 0.0 - 0.2 %   Neutrophils Relative % 89 %   Neutro Abs 14.4 (H)  1.7 - 7.7 K/uL   Lymphocytes Relative 7 %   Lymphs Abs 1.1 0.7 - 4.0 K/uL   Monocytes Relative 4 %   Monocytes Absolute 0.6 0.1 - 1.0 K/uL   Eosinophils Relative 0 %   Eosinophils Absolute 0.0 0.0 - 0.5 K/uL   Basophils Relative 0 %   Basophils Absolute 0.0 0.0 - 0.1 K/uL   Immature Granulocytes 0 %   Abs Immature Granulocytes 0.06 0.00 - 0.07 K/uL    Comment: Performed at [facility], [place], [place] [address]  Comprehensive metabolic panel     Status: Abnormal   Collection Time: 10/08/20  2:40 PM  Result Value Ref Range   Sodium 138 135 - 145 mmol/L   Potassium 3.8 3.5 - 5.1 mmol/L   Chloride 101 98 - 111 mmol/L   CO2 22 22 - 32 mmol/L   Glucose, Bld 138 (H) 70 - 99 mg/dL    Comment: Glucose reference range applies only to  samples taken after fasting for at least 8 hours.   BUN 7 6 - 20 mg/dL   Creatinine, Ser 0.93 0.44 - 1.00 mg/dL   Calcium 9.6 8.9 - 10.3 mg/dL   Total Protein 7.7 6.5 - 8.1 g/dL   Albumin 4.5 3.5 - 5.0 g/dL   AST 11 (L) 15 - 41 U/L   ALT 6 0 - 44 U/L   Alkaline Phosphatase 37 (L) 38 - 126 U/L   Total Bilirubin 1.0 0.3 - 1.2 mg/dL   GFR, Estimated >60 >60 mL/min    Comment: (NOTE) Calculated using the CKD-EPI Creatinine Equation (2021)    Anion gap 15 5 - 15    Comment: Performed at [facility], [address] [place], [place] [address]  Lipase, blood     Status: None   Collection Time: 10/08/20  2:40 PM  Result Value Ref Range   Lipase 13 11 - 51 U/L    Comment: Performed at [facility], [address], [place], [place] [address]  Lactic acid, plasma     Status: None   Collection Time: 10/08/20  3:35 PM  Result Value Ref Range   Lactic Acid, Venous 0.8 0.5 - 1.9 mmol/L    Comment: Performed at [facility], [address] [place], [place] [address]  Resp Panel by RT-PCR (Flu A&B, Covid) Nasopharyngeal Swab     Status: None   Collection Time:  10/08/20  7:25 PM   Specimen: Nasopharyngeal Swab; Nasopharyngeal(NP) swabs in vial transport medium  Result Value Ref Range   SARS Coronavirus 2 by RT PCR NEGATIVE NEGATIVE    Comment: (NOTE) SARS-CoV-2 target nucleic acids are NOT DETECTED.  The SARS-CoV-2 RNA is generally detectable in upper respiratory specimens during the acute phase of infection. The lowest concentration of SARS-CoV-2 viral copies this assay can detect is 138 copies/mL. A negative result does not preclude SARS-Cov-2 infection and should not be used as the sole basis for treatment or other patient management decisions. A negative result may occur with  improper specimen collection/handling, submission of specimen other than nasopharyngeal swab, presence of viral mutation(s) within the areas targeted by this assay, and inadequate number of viral copies(<138 copies/mL). A negative result must be combined with clinical observations, patient history, and epidemiological information. The expected result is Negative.  Fact Sheet for Patients:  EntrepreneurPulse.com.au  Fact Sheet for Healthcare Providers:  IncredibleEmployment.be  This test is no t yet approved or cleared by the [place] FDA and  has been authorized for detection and/or diagnosis of SARS-CoV-2 by FDA under an Emergency Use Authorization (EUA). This EUA will remain  in effect (meaning this test can be used) for the duration of the COVID-19 declaration under Section 564(b)(1) of the Act, 21 U.S.C.section 360bbb-3(b)(1), unless the authorization is terminated  or revoked sooner.       Influenza A by PCR NEGATIVE NEGATIVE   Influenza B by PCR NEGATIVE NEGATIVE    Comment: (NOTE) The Xpert Xpress SARS-CoV-2/FLU/RSV plus assay is intended as an aid in the diagnosis of influenza from Nasopharyngeal swab specimens and should not be used as a sole basis for treatment. Nasal washings and aspirates are  unacceptable for Xpert Xpress SARS-CoV-2/FLU/RSV testing.  Fact Sheet for Patients: EntrepreneurPulse.com.au  Fact Sheet for Healthcare Providers: IncredibleEmployment.be  This test is not yet approved or cleared by the [place] FDA and has been authorized for detection and/or diagnosis of SARS-CoV-2 by FDA under an Emergency Use Authorization (EUA). This EUA  will remain in effect (meaning this test can be used) for the duration of the COVID-19 declaration under Section 564(b)(1) of the Act, 21 U.S.C. section 360bbb-3(b)(1), unless the authorization is terminated or revoked.  Performed at [facility], [address], [place], [place] [address]   Urinalysis, Routine w reflex microscopic Urine, Clean Catch     Status: Abnormal   Collection Time: 10/08/20  7:55 PM  Result Value Ref Range   Color, Urine YELLOW YELLOW   APPearance CLEAR CLEAR   Specific Gravity, Urine 1.036 (H) 1.005 - 1.030   pH 5.5 5.0 - 8.0   Glucose, UA NEGATIVE NEGATIVE mg/dL   Hgb urine dipstick NEGATIVE NEGATIVE   Bilirubin Urine NEGATIVE NEGATIVE   Ketones, ur 15 (A) NEGATIVE mg/dL   Protein, ur NEGATIVE NEGATIVE mg/dL   Nitrite NEGATIVE NEGATIVE   Leukocytes,Ua NEGATIVE NEGATIVE    Comment: Performed at [facility], [place], [place] [address]   CT Abdomen Pelvis W Contrast  Result Date: 10/08/2020 CLINICAL DATA:  Right lower quadrant abdominal pain since yesterday afternoon. One episode of vomiting this morning. EXAM: CT ABDOMEN AND PELVIS WITH CONTRAST TECHNIQUE: Multidetector CT imaging of the abdomen and pelvis was performed using the standard protocol following bolus administration of intravenous contrast. CONTRAST:  17mL OMNIPAQUE IOHEXOL 300 MG/ML  SOLN COMPARISON:  None. FINDINGS: Lower chest: Mild bilateral dependent atelectasis. Normal sized heart. Small hiatal hernia. Hepatobiliary: Small left lobe  liver cyst. Normal appearing gallbladder. Pancreas: Unremarkable. No pancreatic ductal dilatation or surrounding inflammatory changes. Spleen: Normal in size without focal abnormality. Adrenals/Urinary Tract: Adrenal glands are unremarkable. Kidneys are normal, without renal calculi, focal lesion, or hydronephrosis. Bladder is unremarkable. Stomach/Bowel: Dilated, fluid-filled appendix with mild diffuse wall thickening and enhancement as well as periappendiceal soft tissue stranding. The maximum appendiceal diameter is 10.5 mm. The base of the appendix is in the upper right pelvis and the tip of the appendix is in the upper right pelvis to the right of midline. Small hiatal hernia. Mild colonic fat density wall thickening. Unremarkable small bowel. Vascular/Lymphatic: No significant vascular findings are present. No enlarged abdominal or pelvic lymph nodes. Reproductive: Enlarged and heterogeneous uterus containing multiple masses. The largest mass is on the left, measuring 7.6 x 6.5 cm in the axial plane and 6.5 cm in length. This is displacing the uterus posteriorly and laterally to the right. Unremarkable ovaries. Other: No abdominal wall hernia or abnormality. No abdominopelvic ascites. Musculoskeletal: Mild lumbar and lower thoracic spine degenerative changes. IMPRESSION: 1. Acute appendicitis without abscess. 2. Fibroid uterus. 3. Small hiatal hernia. Electronically Signed   By: Claudie Revering M.D.   On: 10/08/2020 18:43    Review of Systems  Gastrointestinal:  Positive for abdominal pain, nausea and vomiting.  All other systems reviewed and are negative.  Blood pressure 128/76, pulse 97, temperature 98.5 F (36.9 C), temperature source Oral, resp. rate 17, height 5\' 6"  (1.676 m), weight 77.1 kg, last menstrual period 08/13/2020, SpO2 98 %. Physical Exam Constitutional:      Appearance: She is well-developed.  HENT:     Head: Normocephalic and atraumatic.  Eyes:     General: No scleral  icterus. Cardiovascular:     Rate and Rhythm: Normal rate and regular rhythm.  Pulmonary:     Effort: Pulmonary effort is normal.     Breath sounds: Normal breath sounds.  Abdominal:     General: There is no distension.     Palpations: Abdomen is soft.  Tenderness: There is abdominal tenderness in the right lower quadrant, suprapubic area and left lower quadrant.     Hernia: No hernia is present.  Skin:    General: Skin is warm.     Capillary Refill: Capillary refill takes less than 2 seconds.  Neurological:     General: No focal deficit present.     Mental Status: She is alert.  Psychiatric:        Mood and Affect: Mood normal.     Assessment/Plan Appendicitis Discussed pathophysiology of appendicitis.  Discussed lap appy with patient. Risks, recovery discussed, hopefully will dc home from pacu  Rolm Bookbinder, MD 10/08/2020, 9:02 PM

## 2020-10-08 NOTE — ED Notes (Signed)
Patient states nausea is better.

## 2020-10-09 LAB — CBC
HCT: 33.5 % — ABNORMAL LOW (ref 36.0–46.0)
Hemoglobin: 10.9 g/dL — ABNORMAL LOW (ref 12.0–15.0)
MCH: 28.5 pg (ref 26.0–34.0)
MCHC: 32.5 g/dL (ref 30.0–36.0)
MCV: 87.7 fL (ref 80.0–100.0)
Platelets: 204 10*3/uL (ref 150–400)
RBC: 3.82 MIL/uL — ABNORMAL LOW (ref 3.87–5.11)
RDW: 13.8 % (ref 11.5–15.5)
WBC: 12 10*3/uL — ABNORMAL HIGH (ref 4.0–10.5)
nRBC: 0 % (ref 0.0–0.2)

## 2020-10-09 MED ORDER — METHOCARBAMOL 500 MG PO TABS: 500.0000 mg | ORAL_TABLET | Freq: Four times a day (QID) | ORAL | Status: DC | PRN

## 2020-10-09 MED ORDER — ENOXAPARIN SODIUM 40 MG/0.4ML IJ SOSY
40.0000 mg | PREFILLED_SYRINGE | INTRAMUSCULAR | Status: DC
  Administered 2020-10-09: 40 mg via SUBCUTANEOUS
  Filled 2020-10-09: qty 0.4

## 2020-10-09 MED ORDER — AMOXICILLIN-POT CLAVULANATE 875-125 MG PO TABS: 1.0000 | ORAL_TABLET | Freq: Two times a day (BID) | ORAL | 0 refills | Status: DC

## 2020-10-09 MED ORDER — OXYCODONE HCL 5 MG PO TABS: 5.0000 mg | ORAL_TABLET | ORAL | Status: DC | PRN

## 2020-10-09 MED ORDER — ONDANSETRON HCL 4 MG/2ML IJ SOLN
4.0000 mg | Freq: Four times a day (QID) | INTRAMUSCULAR | Status: DC | PRN
  Administered 2020-10-09 – 2020-10-10 (×2): 4 mg via INTRAVENOUS
  Filled 2020-10-09 (×2): qty 2

## 2020-10-09 MED ORDER — SODIUM CHLORIDE 0.9 % IV SOLN: INTRAVENOUS | Status: DC

## 2020-10-09 MED ORDER — ONDANSETRON 4 MG PO TBDP
4.0000 mg | ORAL_TABLET | Freq: Four times a day (QID) | ORAL | Status: DC | PRN
  Administered 2020-10-10: 4 mg via ORAL
  Filled 2020-10-09: qty 1

## 2020-10-09 MED ORDER — OXYCODONE HCL 5 MG PO TABS: 5.0000 mg | ORAL_TABLET | Freq: Four times a day (QID) | ORAL | 0 refills | Status: DC | PRN

## 2020-10-09 MED ORDER — SIMETHICONE 80 MG PO CHEW
40.0000 mg | CHEWABLE_TABLET | Freq: Four times a day (QID) | ORAL | Status: DC | PRN
  Filled 2020-10-09: qty 1

## 2020-10-09 MED ORDER — KETOROLAC TROMETHAMINE 15 MG/ML IJ SOLN
15.0000 mg | Freq: Four times a day (QID) | INTRAMUSCULAR | Status: DC
  Administered 2020-10-09 – 2020-10-10 (×6): 15 mg via INTRAVENOUS
  Filled 2020-10-09 (×5): qty 1

## 2020-10-09 MED ORDER — MORPHINE SULFATE (PF) 2 MG/ML IV SOLN
1.0000 mg | INTRAVENOUS | Status: DC | PRN
Start: 2020-10-09 — End: 2020-10-10

## 2020-10-09 MED ORDER — ACETAMINOPHEN 500 MG PO TABS
1000.0000 mg | ORAL_TABLET | Freq: Four times a day (QID) | ORAL | Status: DC
  Administered 2020-10-09 – 2020-10-10 (×6): 1000 mg via ORAL
  Filled 2020-10-09 (×6): qty 2

## 2020-10-09 MED ORDER — PIPERACILLIN-TAZOBACTAM 3.375 G IVPB
3.3750 g | Freq: Three times a day (TID) | INTRAVENOUS | Status: DC
  Administered 2020-10-09 – 2020-10-10 (×3): 3.375 g via INTRAVENOUS
  Filled 2020-10-09 (×7): qty 50

## 2020-10-09 MED ORDER — MORPHINE SULFATE (PF) 2 MG/ML IV SOLN: 1.0000 mg | INTRAVENOUS | Status: DC | PRN

## 2020-10-09 MED ORDER — KETOROLAC TROMETHAMINE 15 MG/ML IJ SOLN
15.0000 mg | INTRAMUSCULAR | Status: AC
  Filled 2020-10-09: qty 1

## 2020-10-09 MED ORDER — ACETAMINOPHEN 500 MG PO TABS
1000.0000 mg | ORAL_TABLET | ORAL | Status: AC
  Filled 2020-10-09: qty 2

## 2020-10-09 NOTE — Progress Notes (Signed)
1 Day Post-Op lap appy Subjective: Feeling sore this am, tolerating some food  Objective: Vital signs in last 24 hours: Temp:  [98.2 F (36.8 C)-99.1 F (37.3 C)] 98.4 F (36.9 C) (06/11 0805) Pulse Rate:  [69-111] 79 (06/11 0805) Resp:  [12-18] 16 (06/11 0805) BP: (90-132)/(52-86) 90/52 (06/11 0805) SpO2:  [91 %-99 %] 97 % (06/11 0805) Weight:  [77.1 kg-78.6 kg] (P) 78.6 kg (06/10 2344)   Intake/Output from previous day: 06/10 0701 - 06/11 0700 In: 1340 [P.O.:240; I.V.:1000; IV Piggyback:100] Out: 25 [Blood:25] Intake/Output this shift: No intake/output data recorded.   General appearance: alert and cooperative GI: soft, appropriately tender  Incision: no significant drainage  Lab Results:  Recent Labs    10/08/20 1440 10/09/20 0202  WBC 16.1* 12.0*  HGB 13.7 10.9*  HCT 41.3 33.5*  PLT 265 204   BMET Recent Labs    10/08/20 1440  NA 138  K 3.8  CL 101  CO2 22  GLUCOSE 138*  BUN 7  CREATININE 0.93  CALCIUM 9.6   PT/INR No results for input(s): LABPROT, INR in the last 72 hours. ABG No results for input(s): PHART, HCO3 in the last 72 hours.  Invalid input(s): PCO2, PO2  MEDS, Scheduled  acetaminophen  1,000 mg Oral On Call to OR   acetaminophen  1,000 mg Oral Q6H   enoxaparin (LOVENOX) injection  40 mg Subcutaneous Q24H   ketorolac  15 mg Intravenous On Call to OR   ketorolac  15 mg Intravenous Q6H    Studies/Results: CT Abdomen Pelvis W Contrast  Result Date: 10/08/2020 CLINICAL DATA:  Right lower quadrant abdominal pain since yesterday afternoon. One episode of vomiting this morning. EXAM: CT ABDOMEN AND PELVIS WITH CONTRAST TECHNIQUE: Multidetector CT imaging of the abdomen and pelvis was performed using the standard protocol following bolus administration of intravenous contrast. CONTRAST:  68mL OMNIPAQUE IOHEXOL 300 MG/ML  SOLN COMPARISON:  None. FINDINGS: Lower chest: Mild bilateral dependent atelectasis. Normal sized heart. Small hiatal  hernia. Hepatobiliary: Small left lobe liver cyst. Normal appearing gallbladder. Pancreas: Unremarkable. No pancreatic ductal dilatation or surrounding inflammatory changes. Spleen: Normal in size without focal abnormality. Adrenals/Urinary Tract: Adrenal glands are unremarkable. Kidneys are normal, without renal calculi, focal lesion, or hydronephrosis. Bladder is unremarkable. Stomach/Bowel: Dilated, fluid-filled appendix with mild diffuse wall thickening and enhancement as well as periappendiceal soft tissue stranding. The maximum appendiceal diameter is 10.5 mm. The base of the appendix is in the upper right pelvis and the tip of the appendix is in the upper right pelvis to the right of midline. Small hiatal hernia. Mild colonic fat density wall thickening. Unremarkable small bowel. Vascular/Lymphatic: No significant vascular findings are present. No enlarged abdominal or pelvic lymph nodes. Reproductive: Enlarged and heterogeneous uterus containing multiple masses. The largest mass is on the left, measuring 7.6 x 6.5 cm in the axial plane and 6.5 cm in length. This is displacing the uterus posteriorly and laterally to the right. Unremarkable ovaries. Other: No abdominal wall hernia or abnormality. No abdominopelvic ascites. Musculoskeletal: Mild lumbar and lower thoracic spine degenerative changes. IMPRESSION: 1. Acute appendicitis without abscess. 2. Fibroid uterus. 3. Small hiatal hernia. Electronically Signed   By: Claudie Revering M.D.   On: 10/08/2020 18:43    Assessment: s/p Procedure(s): APPENDECTOMY LAPAROSCOPIC Patient Active Problem List   Diagnosis Date Noted   S/P laparoscopic appendectomy 10/08/2020   History of Lyme disease 11/12/2018   Urge incontinence 11/12/2017   Overweight 05/07/2017   Allergic rhinitis 05/07/2017  Hyperlipidemia 10/31/2016   TMJ disease 03/17/2015   Anal benign neoplasm 02/14/2011   ACTINIC KERATOSIS, CHEEK, LEFT 11/13/2007   Migraine headache 10/30/2006     Expected post op course  Plan: Advance diet SL IVFs PO pain meds Ambulate in hall Abx x5d   LOS: 0 days     .Rosario Adie, MD [facility], [place]    10/09/2020 10:45 AM

## 2020-10-10 MED ORDER — ONDANSETRON HCL 4 MG PO TABS: 4.0000 mg | ORAL_TABLET | Freq: Every day | ORAL | 0 refills | Status: AC | PRN

## 2020-10-10 NOTE — Progress Notes (Signed)
Discharge orders written, pt awaiting transport home

## 2020-10-10 NOTE — Anesthesia Postprocedure Evaluation (Signed)
Anesthesia Post Note  Patient: Samantha Pena  Procedure(s) Performed: APPENDECTOMY LAPAROSCOPIC     Patient location during evaluation: PACU Anesthesia Type: General Level of consciousness: awake and alert Pain management: pain level controlled Vital Signs Assessment: post-procedure vital signs reviewed and stable Respiratory status: spontaneous breathing, nonlabored ventilation, respiratory function stable and patient connected to nasal cannula oxygen Cardiovascular status: blood pressure returned to baseline and stable Postop Assessment: no apparent nausea or vomiting Anesthetic complications: no   No notable events documented.  Last Vitals:  Vitals:   10/09/20 2234 10/10/20 0434  BP: 106/69 107/69  Pulse: 67 72  Resp: 14 17  Temp: 37.1 C 36.8 C  SpO2: 96% 97%    Last Pain:  Vitals:   10/10/20 0800  TempSrc:   PainSc: 7                  Analaya Hoey

## 2020-10-10 NOTE — Discharge Summary (Signed)
Physician Discharge Summary  Patient ID: Samantha Pena MRN: [id number] DOB/AGE: 1965-06-15 55 y.o.  Admit date: 10/08/2020 Discharge date: 10/10/2020  Admission Diagnoses: appendicitis Discharge Diagnoses:  Active Problems:   S/P laparoscopic appendectomy   Discharged Condition: stable  Hospital Course: Patient was admitted to the hospital after laparoscopic appendectomy.  She developed a significant migraine on postop day 1 which limited her recovery.  By postop day 2 she was tolerating a diet and oral pain medications.  She was felt to be in stable condition for discharge.  Consults: None  Significant Diagnostic Studies: labs: CBC and chemistry CT scan: Appendicitis Treatments: IV hydration, antibiotics: ceftriaxone, analgesia: acetaminophen and oxycodone, and surgery: Laparoscopic appendectomy  Discharge Exam: Blood pressure 107/69, pulse 72, temperature 98.2 F (36.8 C), temperature source Oral, resp. rate 17, height (P) 5\' 6"  (1.676 m), weight (P) 78.6 kg, last menstrual period 08/13/2020, SpO2 97 %. General appearance: alert and cooperative GI: Soft, appropriately tender Incision/Wound: Clean, dry, intact  Disposition:   Home   Allergies as of 10/10/2020       Reactions   No Known Allergies         Medication List     TAKE these medications    amoxicillin-clavulanate 875-125 MG tablet Commonly known as: AUGMENTIN Take 1 tablet by mouth 2 (two) times daily.   CHEWABLE CALCIUM PO Take 500 mg by mouth daily.   fluticasone 50 MCG/ACT nasal spray Commonly known as: FLONASE INSTIL 2 SPRAYS IN EACH NOSTRIL ONCE DAILY What changed:  how much to take how to take this when to take this   levocetirizine 5 MG tablet Commonly known as: XYZAL Take 5 mg by mouth every evening.   montelukast 10 MG tablet Commonly known as: SINGULAIR Take 10 mg by mouth at bedtime.   MOTRIN PO Take 1 tablet by mouth every 8 (eight) hours as needed. Pain   MULTI FOR HER  PO Take by mouth daily.   ondansetron 4 MG tablet Commonly known as: Zofran Take 1 tablet (4 mg total) by mouth daily as needed for nausea or vomiting.   oxybutynin 10 MG 24 hr tablet Commonly known as: DITROPAN-XL Take 10 mg by mouth in the morning.   oxyCODONE 5 MG immediate release tablet Commonly known as: Oxy IR/ROXICODONE Take 1-2 tablets (5-10 mg total) by mouth every 6 (six) hours as needed for moderate pain.   SUMAtriptan 20 MG/ACT nasal spray Commonly known as: IMITREX USE 1 SPRAY INTO EACH NOSTRIL EVERY 2 HOURS AS NEEDED AND DIRECTED BY DR What changed: See the new instructions.   tretinoin 0.025 % cream Commonly known as: RETIN-A Apply 1 application topically at bedtime.        Follow-up [facility], [place]. Schedule an appointment as soon as possible for a visit in 2 week(s).   Specialty: General Surgery Contact information: [address] [place] [phone]                Signed: Rosario Adie 09/17/8020, 10:28 AM

## 2020-10-10 NOTE — Progress Notes (Signed)
Paged md on call at pt/pt husband request. Pt/husband concerned for discolored menses pt describes as "yellow" drainage. MD returned call and spoke to pt husband by phone. Pt husband conveyed to pt md states discharge r/t mucoid discharge excreted as result of surgical procedure. Pt/husband amendable to reassurance and agreeable with discharge to home

## 2020-10-10 NOTE — Progress Notes (Signed)
Discharge instructions reviewed and given to pt, pt acknowledge understanding.

## 2020-10-11 ENCOUNTER — Encounter (HOSPITAL_COMMUNITY): Payer: Self-pay | Admitting: General Surgery

## 2020-10-12 LAB — SURGICAL PATHOLOGY

## 2020-10-14 NOTE — Telephone Encounter (Signed)
Please check on patient. Appears she had appendectomy and is already back home. Please tell her thank you for seeking care.

## 2020-10-14 NOTE — Telephone Encounter (Signed)
Patient states that she's walking slowly but she's a little sore in her abdomen area. She also notices that she has a little bit of pooling in her right eye since she left the hospital but they told her it was normal. At the current time she's not having any issues that she's concerned about.  I advised her to give the office a call if she had any questions comments or concerns.

## 2020-10-20 ENCOUNTER — Other Ambulatory Visit: Payer: Self-pay | Admitting: Obstetrics and Gynecology

## 2020-10-20 DIAGNOSIS — N6310 Unspecified lump in the right breast, unspecified quadrant: Secondary | ICD-10-CM

## 2020-10-25 ENCOUNTER — Other Ambulatory Visit: Payer: Self-pay

## 2020-10-25 ENCOUNTER — Ambulatory Visit
Admission: RE | Admit: 2020-10-25 | Discharge: 2020-10-25 | Disposition: A | Discharge status: Home / Self Care | Payer: BC Managed Care – PPO | Source: Ambulatory Visit | Attending: Obstetrics and Gynecology | Admitting: Obstetrics and Gynecology

## 2020-10-25 DIAGNOSIS — N6310 Unspecified lump in the right breast, unspecified quadrant: Secondary | ICD-10-CM

## 2020-10-25 DIAGNOSIS — R922 Inconclusive mammogram: Secondary | ICD-10-CM | POA: Diagnosis not present

## 2020-12-13 DIAGNOSIS — Z6827 Body mass index (BMI) 27.0-27.9, adult: Secondary | ICD-10-CM | POA: Diagnosis not present

## 2020-12-13 DIAGNOSIS — Z01419 Encounter for gynecological examination (general) (routine) without abnormal findings: Secondary | ICD-10-CM | POA: Diagnosis not present

## 2020-12-13 DIAGNOSIS — N959 Unspecified menopausal and perimenopausal disorder: Secondary | ICD-10-CM | POA: Diagnosis not present

## 2021-01-25 DIAGNOSIS — N924 Excessive bleeding in the premenopausal period: Secondary | ICD-10-CM | POA: Diagnosis not present

## 2021-01-25 DIAGNOSIS — D251 Intramural leiomyoma of uterus: Secondary | ICD-10-CM | POA: Diagnosis not present

## 2021-02-05 IMAGING — MG DIGITAL SCREENING BILATERAL MAMMOGRAM WITH TOMO AND CAD
8 series · 9 of 24 positions shown · non-contrast
Comparison: Previous exam(s).

CLINICAL DATA: Screening.

EXAM:
DIGITAL SCREENING BILATERAL MAMMOGRAM WITH TOMO AND CAD

[R CC synth-2D]
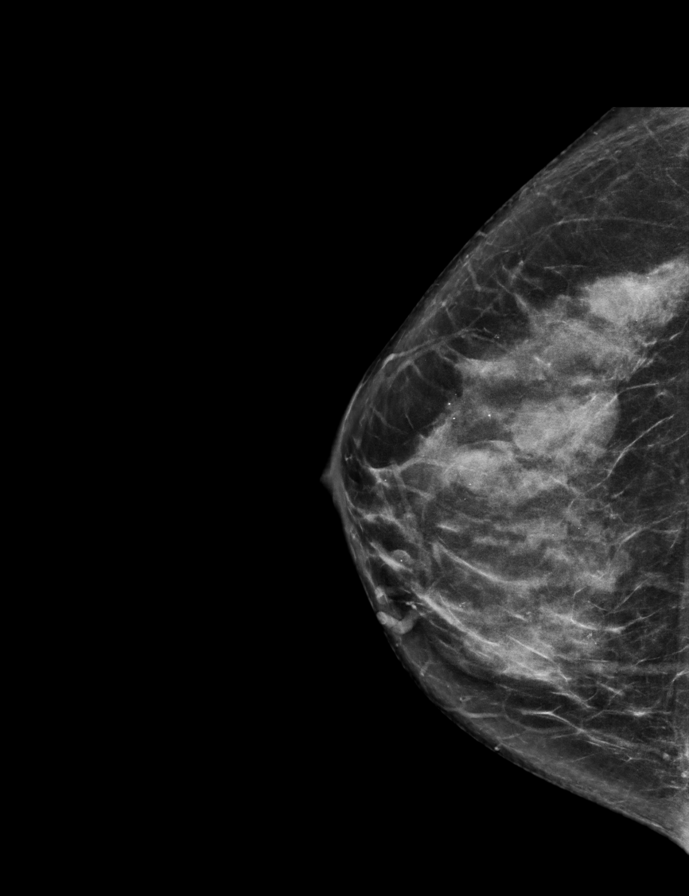

[L MLO synth-2D]
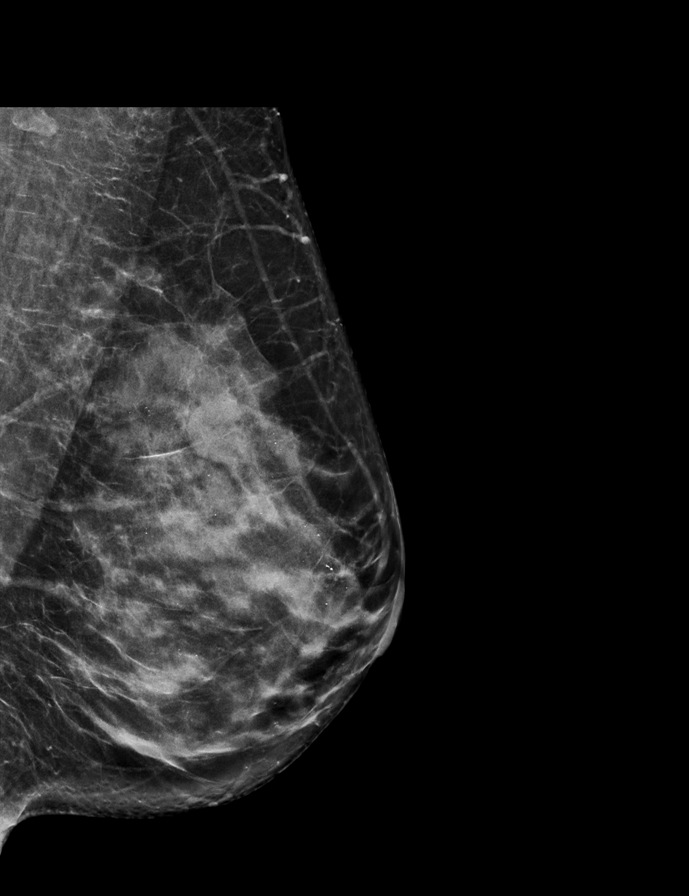

[L CC synth-2D]
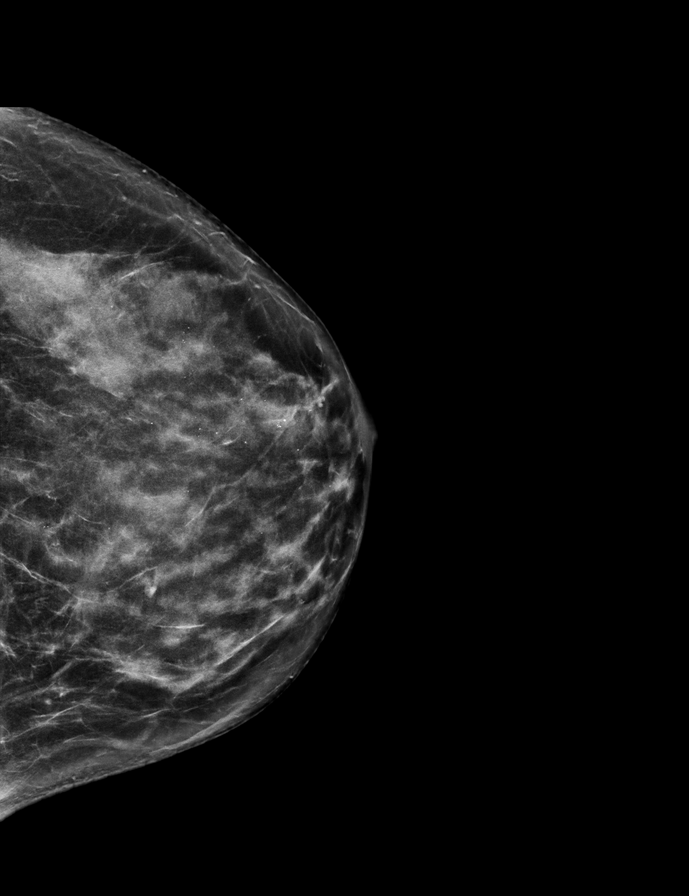

[R MLO synth-2D]
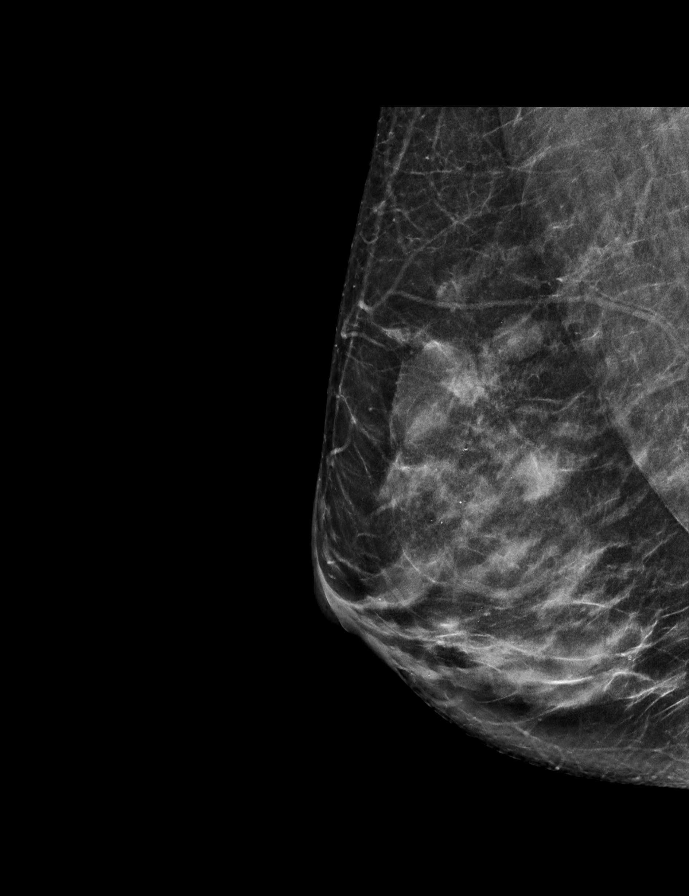

[L CC tomo · 2 of 63 frames shown]
[frame 21/63]
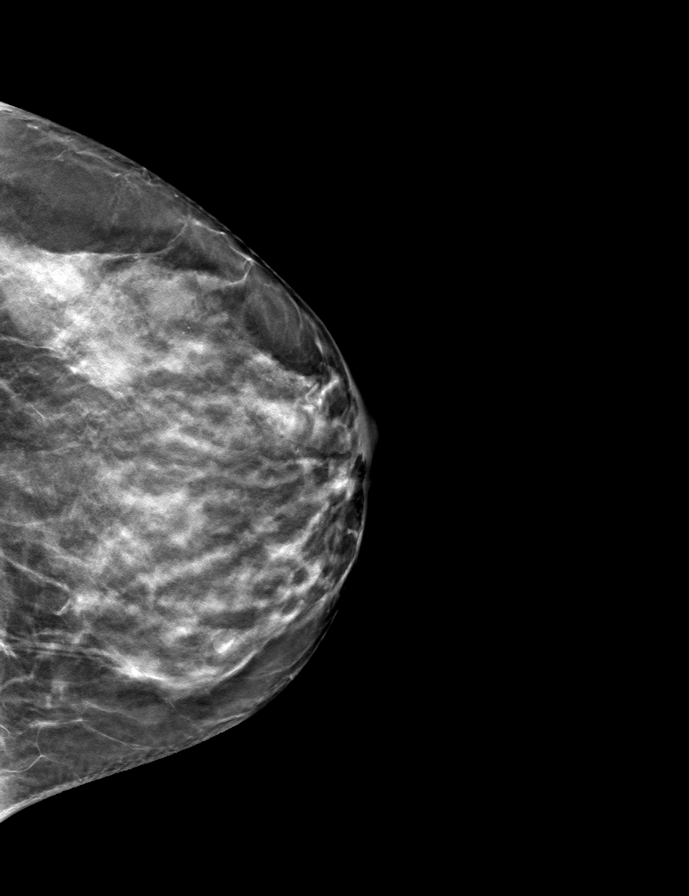
[frame 32/63]
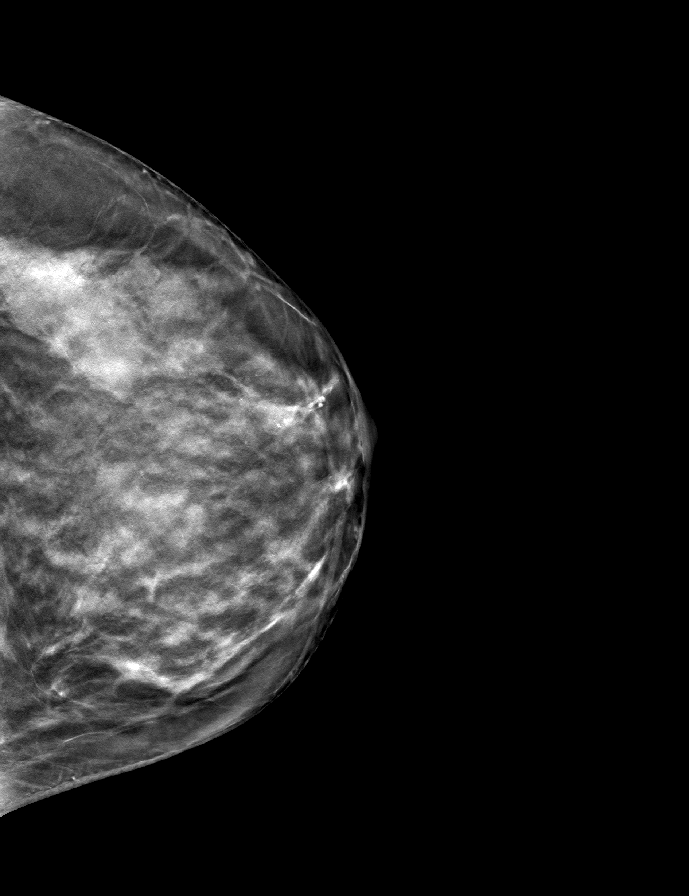

[L MLO tomo · tomo slice 35/68.0]
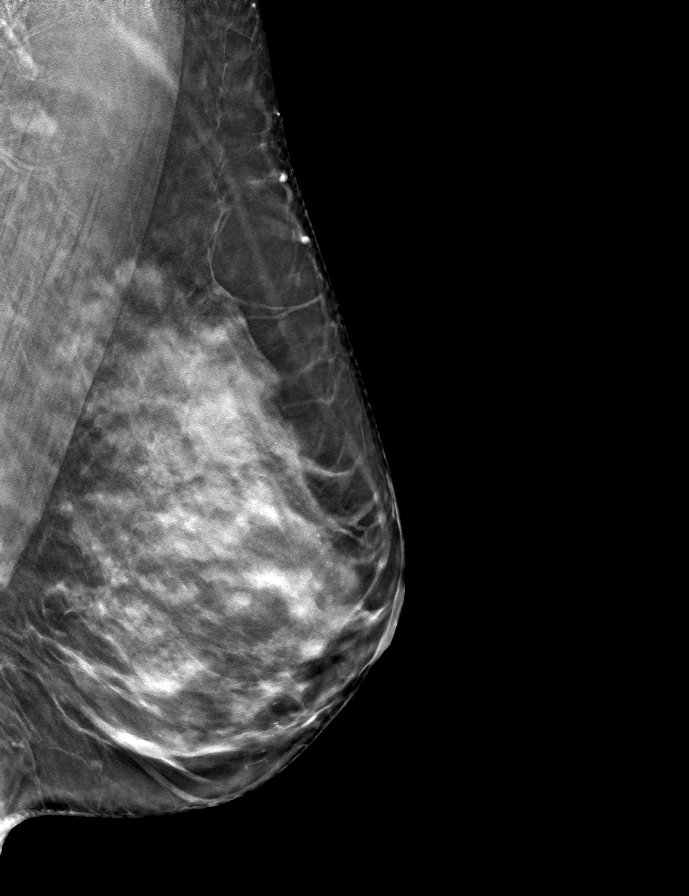

[R MLO tomo · tomo slice 33/65.0]
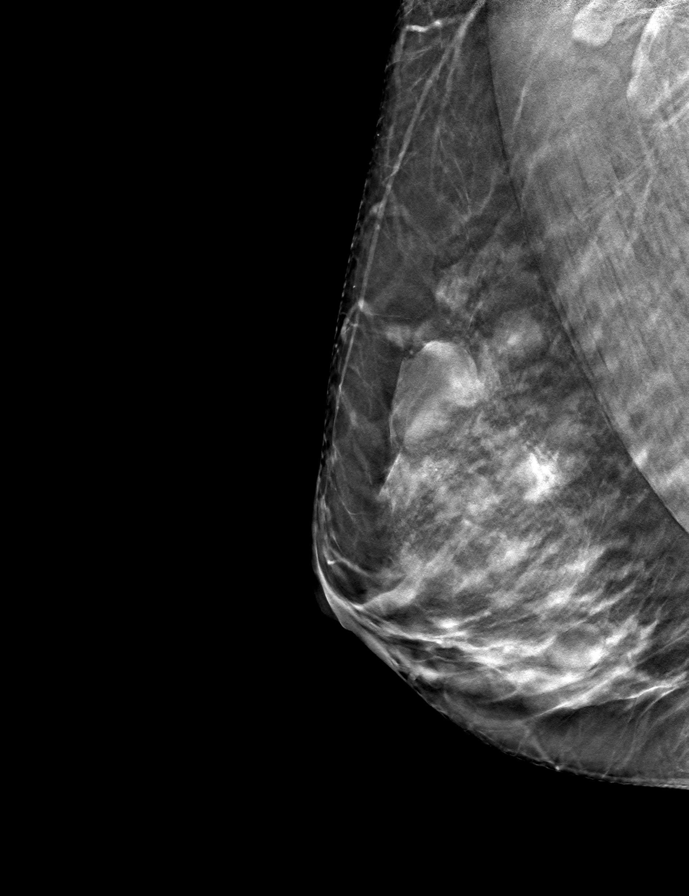

[R CC tomo · tomo slice 33/65.0]
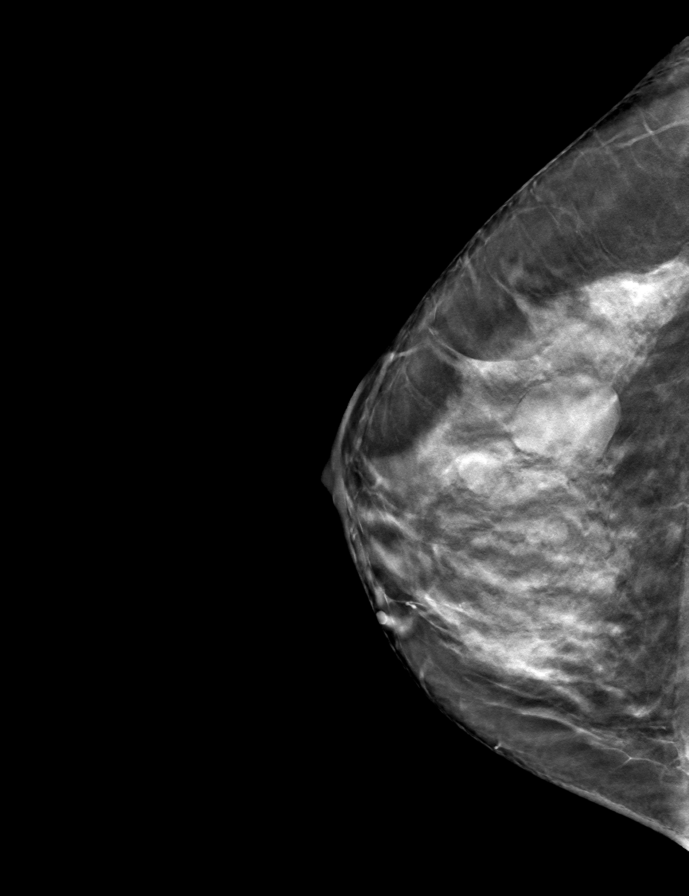

[9 of 24 positions shown; findings below may reference images not displayed]

ACR Breast Density Category c: The breast tissue is heterogeneously
dense, which may obscure small masses.
FINDINGS: In the right breast, a possible mass warrants further evaluation. In
the left breast, no findings suspicious for malignancy. Images were
processed with CAD.
IMPRESSION: Further evaluation is suggested for possible mass in the right
breast.

RECOMMENDATION:
Diagnostic mammogram and possibly ultrasound of the right breast.
(Code:[id number]-[id number])

The patient will be contacted regarding the findings, and additional
imaging will be scheduled.

BI-RADS CATEGORY  0: Incomplete. Need additional imaging evaluation
and/or prior mammograms for comparison.

## 2021-02-12 IMAGING — US ULTRASOUND RIGHT BREAST LIMITED
1 series · 11 of 11 positions shown · non-contrast
Comparison: Previous exam(s).

CLINICAL DATA: Right breast upper outer quadrant possible mass seen
on most recent screening mammography.

EXAM:
DIGITAL DIAGNOSTIC RIGHT MAMMOGRAM WITH CAD AND TOMO
ULTRASOUND RIGHT BREAST

[Series 1: ultrasound right breast limited · 0.07mm/px · 11 of 11 slices shown]
[im 1/11]
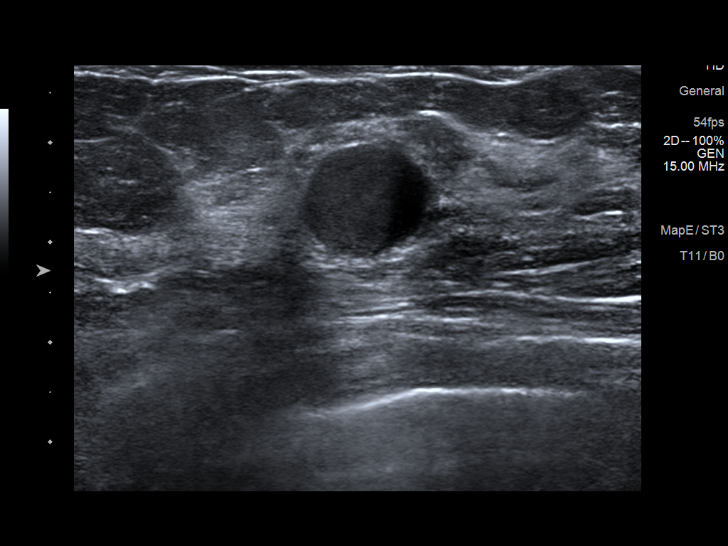
[im 2/11]
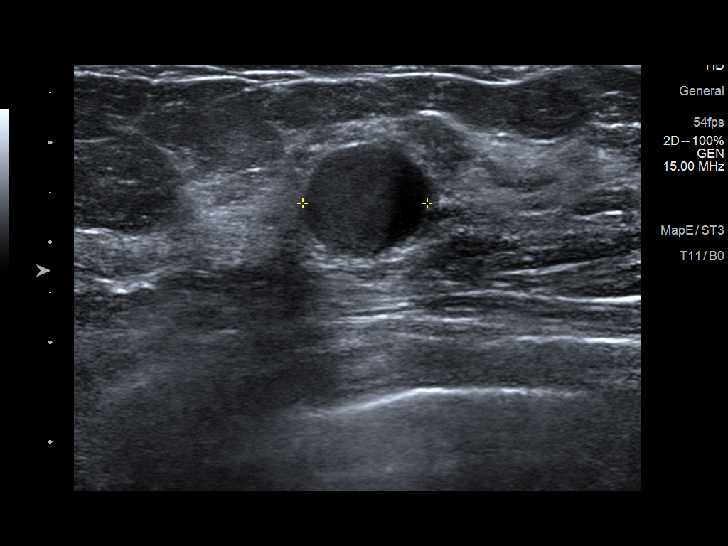
[im 3/11]
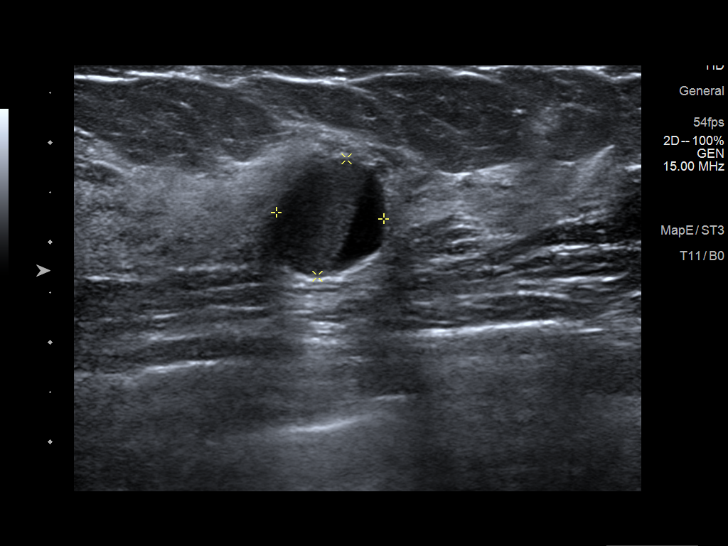
[im 4/11]
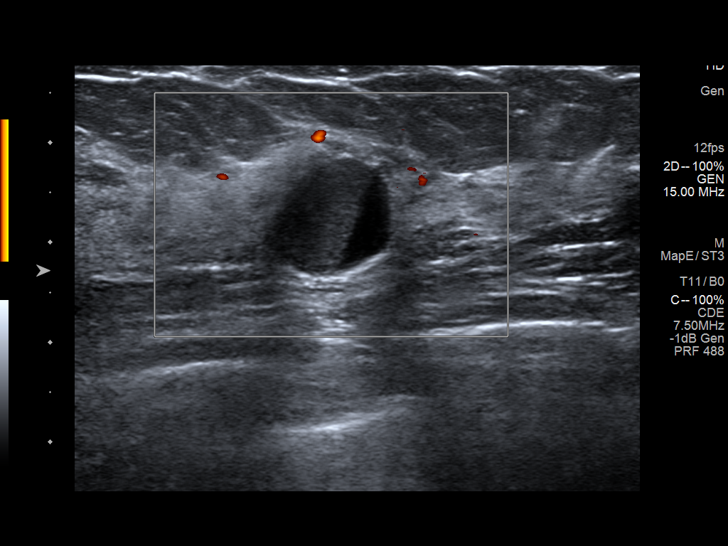
[im 5/11]
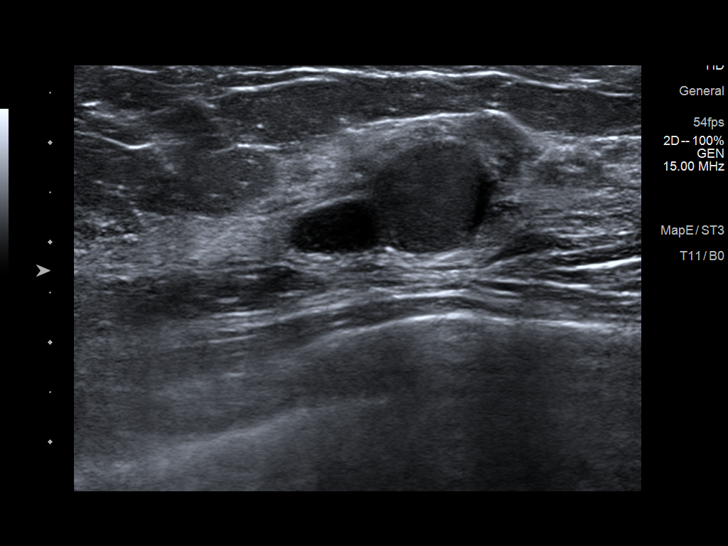
[im 6/11]
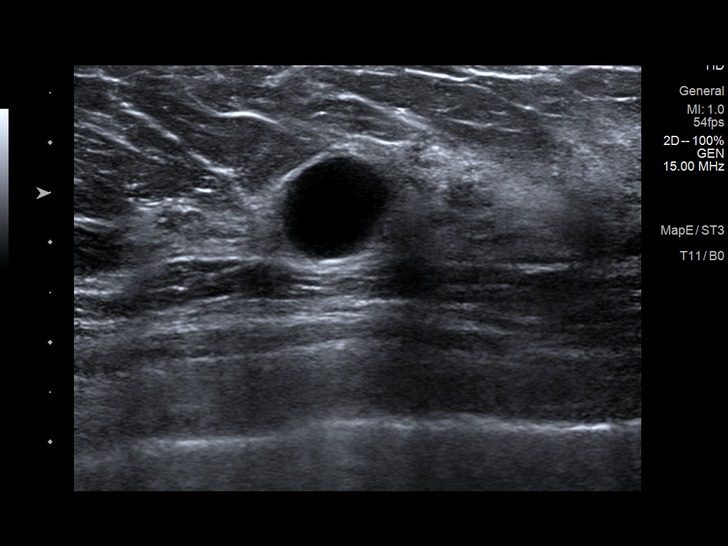
[im 7/11]
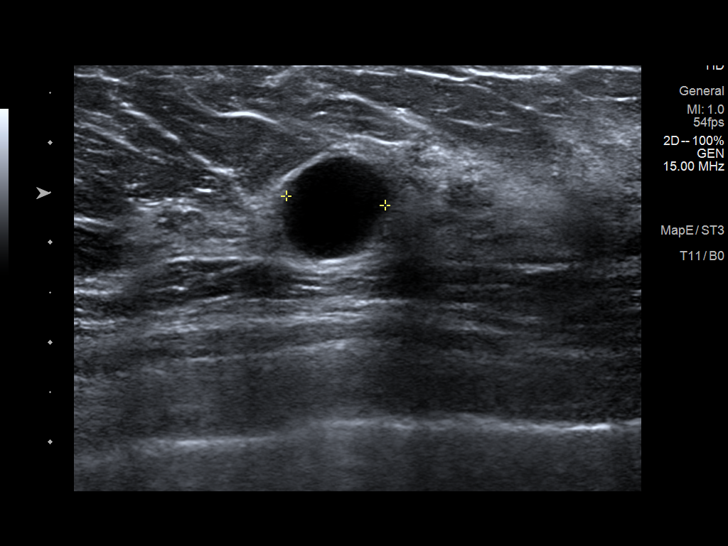
[im 8/11]
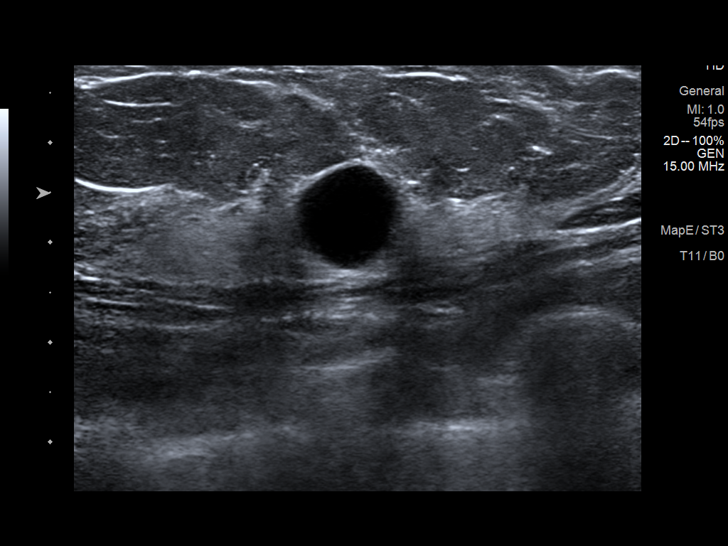
[im 9/11]
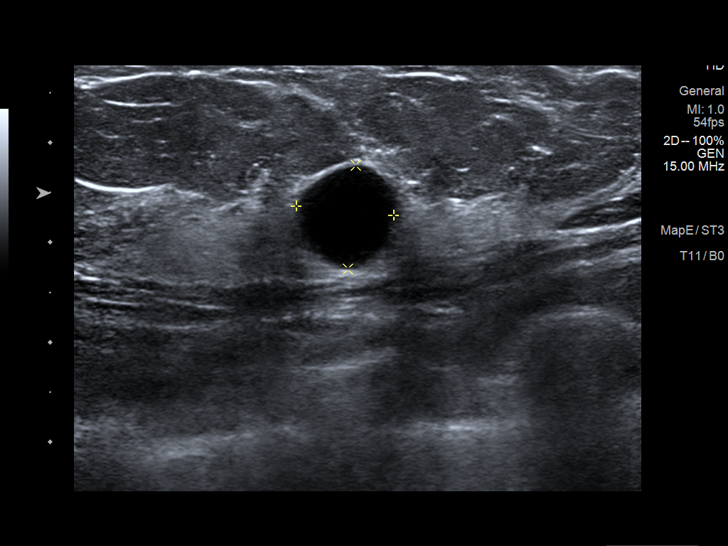
[im 10/11]
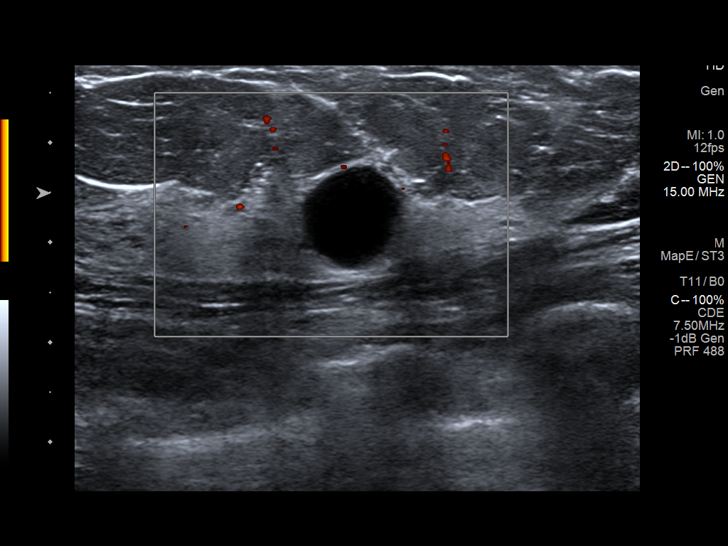
[im 11/11]
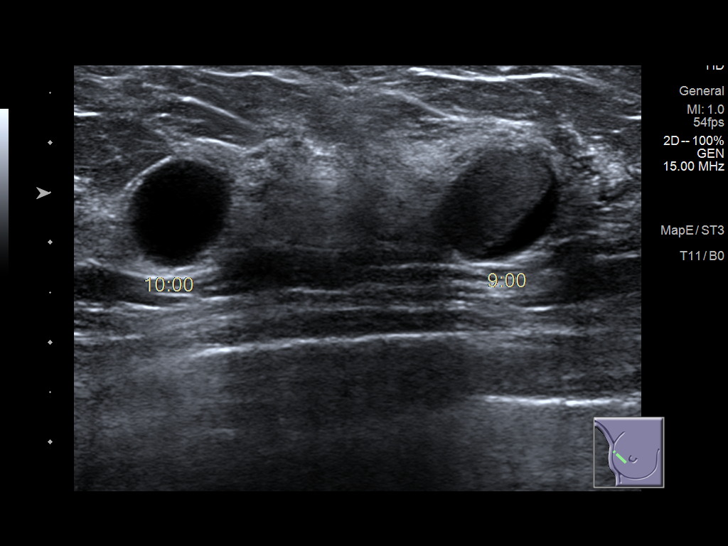

[11 of 11 positions shown; findings below may reference images not displayed]

ACR Breast Density Category c: The breast tissue is heterogeneously
dense, which may obscure small masses.
FINDINGS: Additional mammographic views of the right breast demonstrate
several benign-appearing similar in appearance circumscribed masses
in the right breast upper outer quadrant.

Mammographic images were processed with CAD.

Targeted ultrasound is performed, showing benign-appearing mass in
the right breast 10 o'clock 6 cm nipple measuring 1 cm. In the right
breast 9 o'clock 3 cm from the nipple there is a biphasic
complicated cyst which measures 1.3 x 1.1 x 1.2 cm. The appearance
suggestive of a hemorrhagic cyst. No internal blood flow is seen.
IMPRESSION: Right breast 9 o'clock 3 cm from the nipple 1.3 cm probable
hemorrhagic cyst, for which short-term follow-up is recommended.

RECOMMENDATION:
Targeted right breast ultrasound in 6 months.

I have discussed the findings and recommendations with the patient.
Results were also provided in writing at the conclusion of the
visit. If applicable, a reminder letter will be sent to the patient
regarding the next appointment.

BI-RADS CATEGORY  3: Probably benign.

## 2021-02-21 DIAGNOSIS — N840 Polyp of corpus uteri: Secondary | ICD-10-CM | POA: Diagnosis not present

## 2021-02-21 DIAGNOSIS — N841 Polyp of cervix uteri: Secondary | ICD-10-CM | POA: Diagnosis not present

## 2021-02-21 DIAGNOSIS — N939 Abnormal uterine and vaginal bleeding, unspecified: Secondary | ICD-10-CM | POA: Diagnosis not present

## 2021-02-21 HISTORY — PX: CERVICAL POLYPECTOMY: SHX88

## 2021-04-07 ENCOUNTER — Other Ambulatory Visit: Payer: Self-pay | Admitting: Family Medicine

## 2021-04-07 NOTE — Telephone Encounter (Signed)
Last ov: video visit: 05/03/2020, in office visit: 11/12/2018 Next appointment: none scheduled Last refill: 06/01/2020 Dispense: 6 refills: 3  Please advise

## 2021-04-12 DIAGNOSIS — J3089 Other allergic rhinitis: Secondary | ICD-10-CM | POA: Diagnosis not present

## 2021-04-12 DIAGNOSIS — J3081 Allergic rhinitis due to animal (cat) (dog) hair and dander: Secondary | ICD-10-CM | POA: Diagnosis not present

## 2021-04-12 DIAGNOSIS — J301 Allergic rhinitis due to pollen: Secondary | ICD-10-CM | POA: Diagnosis not present

## 2021-05-01 DIAGNOSIS — C801 Malignant (primary) neoplasm, unspecified: Secondary | ICD-10-CM

## 2021-05-01 HISTORY — DX: Malignant (primary) neoplasm, unspecified: C80.1

## 2021-06-23 ENCOUNTER — Telehealth: Payer: Self-pay | Admitting: Family Medicine

## 2021-06-23 NOTE — Telephone Encounter (Signed)
FYI

## 2021-06-23 NOTE — Telephone Encounter (Signed)
Pt called wanting to sch appt with dr hunter- patient was offered to be triaged, declined. Patient stated she is having abd pain and cramping.- stated its bc of 10/08/20 appendix sx.- stated she has a hernia.- would like to com ein office to see dr Cherlynn Kaiser on 06/24/21.-

## 2021-06-24 ENCOUNTER — Other Ambulatory Visit: Payer: Self-pay

## 2021-06-24 ENCOUNTER — Ambulatory Visit (INDEPENDENT_AMBULATORY_CARE_PROVIDER_SITE_OTHER): Payer: BC Managed Care – PPO | Admitting: Family Medicine

## 2021-06-24 ENCOUNTER — Ambulatory Visit (HOSPITAL_BASED_OUTPATIENT_CLINIC_OR_DEPARTMENT_OTHER): Payer: BC Managed Care – PPO

## 2021-06-24 ENCOUNTER — Encounter: Payer: Self-pay | Admitting: Family Medicine

## 2021-06-24 VITALS — BP 130/86 | HR 87 | Temp 99.0°F | Ht 66.0 in | Wt 166.1 lb

## 2021-06-24 DIAGNOSIS — R1084 Generalized abdominal pain: Secondary | ICD-10-CM

## 2021-06-24 LAB — COMPREHENSIVE METABOLIC PANEL
ALT: 8 U/L (ref 0–35)
AST: 15 U/L (ref 0–37)
Albumin: 4.5 g/dL (ref 3.5–5.2)
Alkaline Phosphatase: 34 U/L — ABNORMAL LOW (ref 39–117)
BUN: 15 mg/dL (ref 6–23)
CO2: 31 mEq/L (ref 19–32)
Calcium: 9.3 mg/dL (ref 8.4–10.5)
Chloride: 103 mEq/L (ref 96–112)
Creatinine, Ser: 0.95 mg/dL (ref 0.40–1.20)
GFR: 68.26 mL/min (ref >60.00)
Glucose, Bld: 120 mg/dL — ABNORMAL HIGH (ref 70–99)
Potassium: 4.1 mEq/L (ref 3.5–5.1)
Sodium: 139 mEq/L (ref 135–145)
Total Bilirubin: 0.4 mg/dL (ref 0.2–1.2)
Total Protein: 6.8 g/dL (ref 6.0–8.3)

## 2021-06-24 LAB — POCT URINALYSIS DIPSTICK
Bilirubin, UA: NEGATIVE
Blood, UA: POSITIVE
Glucose, UA: NEGATIVE
Ketones, UA: NEGATIVE
Leukocytes, UA: NEGATIVE
Nitrite, UA: NEGATIVE
Protein, UA: NEGATIVE
Spec Grav, UA: 1.015 (ref 1.010–1.025)
Urobilinogen, UA: 0.2 E.U./dL
pH, UA: 7 (ref 5.0–8.0)

## 2021-06-24 LAB — CBC WITH DIFFERENTIAL/PLATELET
Basophils Absolute: 0 10*3/uL (ref 0.0–0.1)
Basophils Relative: 0.9 % (ref 0.0–3.0)
Eosinophils Absolute: 0.1 10*3/uL (ref 0.0–0.7)
Eosinophils Relative: 1.7 % (ref 0.0–5.0)
HCT: 38.7 % (ref 36.0–46.0)
Hemoglobin: 12.8 g/dL (ref 12.0–15.0)
Lymphocytes Relative: 28.1 % (ref 12.0–46.0)
Lymphs Abs: 1.4 10*3/uL (ref 0.7–4.0)
MCHC: 33.2 g/dL (ref 30.0–36.0)
MCV: 85.1 fl (ref 78.0–100.0)
Monocytes Absolute: 0.4 10*3/uL (ref 0.1–1.0)
Monocytes Relative: 7.2 % (ref 3.0–12.0)
Neutro Abs: 3.1 10*3/uL (ref 1.4–7.7)
Neutrophils Relative %: 62.1 % (ref 43.0–77.0)
Platelets: 254 10*3/uL (ref 150.0–400.0)
RBC: 4.54 Mil/uL (ref 3.87–5.11)
RDW: 13.6 % (ref 11.5–15.5)
WBC: 5 10*3/uL (ref 4.0–10.5)

## 2021-06-24 LAB — POCT URINE PREGNANCY: Preg Test, Ur: NEGATIVE

## 2021-06-24 MED ORDER — AMOXICILLIN-POT CLAVULANATE 875-125 MG PO TABS: 1.0000 | ORAL_TABLET | Freq: Two times a day (BID) | ORAL | 0 refills | Status: DC

## 2021-06-24 NOTE — Progress Notes (Signed)
Subjective:     Patient ID: Samantha Pena, female    DOB: 24-Aug-1965, 56 y.o.   MRN: [id number]  Chief Complaint  Patient presents with   Abdominal Pain    Constant sharp pain on left side that radiates to back started over 1 month ago    Hernia    Found on scan in June    HPI Poss started after eating a yogurt and 2 hrs later, got cramps and diarrhea 1 mo constant sharp L sided abd pain that radiates to back.   Constant cramping L side LUQ but can be near [employer] and LLQ.  Feels like "something sticking out" above umbilicus.   Back pain as well across back.   No n/v.  Can have loose stools most days past 1 mo. No blood.  cscope about 2 yrs ago-normal per pt. No f/c but hands can tingle No new urinary complaints. Motrin not helping  In Oct-uterine polyp and Poss D&C-has large fibroids-Dr. Lowe-Uterus getting pushed over to R-watching q 6 mo Menses had been q 29 days. Had period Nov.  Then Dec-heavy bleeding-skipped in Jan, then started 2/19  Health Maintenance Due  Topic Date Due   COVID-19 Vaccine (1) Never done   Hepatitis C Screening  Never done   Zoster Vaccines- Shingrix (1 of 2) Never done   PAP SMEAR-Modifier  11/15/2019   INFLUENZA VACCINE  11/29/2020    Past Medical History:  Diagnosis Date   Anxiety    Cyst October 2012   2016 stable. near rectum that gives pt discomfort when sitting and has grown in size in the past year    Migraines     Past Surgical History:  Procedure Laterality Date   CERVICAL POLYPECTOMY  02/21/2021   uterus   CESAREAN SECTION  03/18/1999   FOOT SURGERY Right 09/30/2002   bone in toe had to be shaved and realigned    LAPAROSCOPIC APPENDECTOMY N/A 10/08/2020   Procedure: APPENDECTOMY LAPAROSCOPIC;  Surgeon: Rolm Bookbinder, MD;  Location: [facility];  Service: General;  Laterality: N/A;    Outpatient Medications Prior to Visit  Medication Sig Dispense Refill   Calcium Carbonate (CHEWABLE CALCIUM PO) Take 500 mg by mouth daily.      fluticasone (FLONASE) 50 MCG/ACT nasal spray 1 spray in each nostril     Ibuprofen (MOTRIN PO) Take 1 tablet by mouth every 8 (eight) hours as needed. Pain     ipratropium (ATROVENT) 0.06 % nasal spray 2 sprays in each nostril     levocetirizine (XYZAL) 5 MG tablet 1 tablet in the evening     montelukast (SINGULAIR) 10 MG tablet 1 tablet     Multiple Vitamins-Minerals (MULTI FOR HER PO) Take by mouth daily.     ondansetron (ZOFRAN) 4 MG tablet Take 1 tablet (4 mg total) by mouth daily as needed for nausea or vomiting. 20 tablet 0   oxybutynin (DITROPAN-XL) 10 MG 24 hr tablet Take 10 mg by mouth at bedtime.     SUMAtriptan (IMITREX) 20 MG/ACT nasal spray USE 1 SPRAY INTO EACH NOSTRIL EVERY 2 HOURS AS NEEDED (max twice per day) 6 each 11   tretinoin (RETIN-A) 0.025 % cream Apply 1 application topically at bedtime.     zolpidem (AMBIEN) 10 MG tablet 1 tablet at bedtime as needed     amoxicillin-clavulanate (AUGMENTIN) 875-125 MG tablet Take 1 tablet by mouth 2 (two) times daily. 10 tablet 0   medroxyPROGESTERone (PROVERA) 10 MG tablet medroxyprogesterone 10 mg  tablet     oxyCODONE (OXY IR/ROXICODONE) 5 MG immediate release tablet Take 1-2 tablets (5-10 mg total) by mouth every 6 (six) hours as needed for moderate pain. 20 tablet 0   tolterodine (DETROL LA) 4 MG 24 hr capsule Take 1 tablet by mouth daily.     fluticasone (FLONASE) 50 MCG/ACT nasal spray INSTIL 2 SPRAYS IN EACH NOSTRIL ONCE DAILY (Patient taking differently: Place 2 sprays into both nostrils daily. INSTIL 2 SPRAYS IN EACH NOSTRIL ONCE DAILY) 48 mL 1   levocetirizine (XYZAL) 5 MG tablet Take 5 mg by mouth every evening.     montelukast (SINGULAIR) 10 MG tablet Take 10 mg by mouth at bedtime.     oxybutynin (DITROPAN-XL) 10 MG 24 hr tablet Take 10 mg by mouth in the morning.     No facility-administered medications prior to visit.    Allergies  Allergen Reactions   No Known Allergies    ROS neg/noncontributory except as noted  HPI/below Working on TLC so losing wt.     Objective:     BP 130/86    Pulse 87    Temp 99 F (37.2 C) (Temporal)    Ht 5\' 6"  (1.676 m)    Wt 166 lb 2 oz (75.4 kg)    LMP 06/19/2021 (Exact Date)    SpO2 98%    BMI 26.81 kg/m  Wt Readings from Last 3 Encounters:  06/24/21 166 lb 2 oz (75.4 kg)  10/08/20 (P) 173 lb 4.5 oz (78.6 kg)  11/12/18 171 lb 3.2 oz (77.7 kg)        Gen: WDWN NAD WF HEENT: NCAT, conjunctiva not injected, sclera nonicteric NECK:  supple, no thyromegaly, no nodes, no carotid bruits CARDIAC: RRR, S1S2+, no murmur. DP 2+B LUNGS: CTAB. No wheezes ABDOMEN:  BS+, soft, diffusely, mod tender all over esp L side.  No HSM, no masses EXT:  no edema MSK: no gross abnormalities. Tender paraspinous muscle spasms. On L mid to lower back NEURO: A&O x3.  CN II-XII intact.  PSYCH: normal mood. Good eye contact  Ate 830-am  Results for orders placed or performed in visit on 06/24/21  POCT urinalysis dipstick  Result Value Ref Range   Color, UA straw    Clarity, UA clear    Glucose, UA Negative Negative   Bilirubin, UA negative    Ketones, UA negative    Spec Grav, UA 1.015 1.010 - 1.025   Blood, UA positive    pH, UA 7.0 5.0 - 8.0   Protein, UA Negative Negative   Urobilinogen, UA 0.2 0.2 or 1.0 E.U./dL   Nitrite, UA negative    Leukocytes, UA Negative Negative   Appearance     Odor    POCT urine pregnancy  Result Value Ref Range   Preg Test, Ur Negative Negative   Pt on menses  Assessment & Plan:   Problem List Items Addressed This Visit   None Visit Diagnoses     Generalized abdominal pain    -  Primary   Relevant Orders   POCT urinalysis dipstick   POCT urine pregnancy   CBC with Differential/Platelet   Comprehensive metabolic panel   CT Abdomen Pelvis W Contrast      Abd pain-?diverticulitis, degen fibroids, abscess, other.  Will check labs.  Stat CT.  Augmentin to cover poss diverticulitis  Meds ordered this encounter  Medications    amoxicillin-clavulanate (AUGMENTIN) 875-125 MG tablet    Sig: Take 1 tablet by mouth 2 (  two) times daily.    Dispense:  20 tablet    Refill:  0    Wellington Hampshire, MD

## 2021-06-24 NOTE — Patient Instructions (Signed)
It was very nice to see you today!  Get antibiotics filled.  Worse, ER.   PLEASE NOTE:  If you had any lab tests please let us know if you have not heard back within a few days. You may see your results on MyChart before we have a chance to review them but we will give you a call once they are reviewed by [place]. If we ordered any referrals today, please let us know if you have not heard from their office within the next week.   Please try these tips to maintain a healthy lifestyle:  Eat most of your calories during the day when you are active. Eliminate processed foods including packaged sweets (pies, cakes, cookies), reduce intake of potatoes, white bread, white pasta, and white rice. Look for whole grain options, oat flour or almond flour.  Each meal should contain half fruits/vegetables, one quarter protein, and one quarter carbs (no bigger than a computer mouse).  Cut down on sweet beverages. This includes juice, soda, and sweet tea. Also watch fruit intake, though this is a healthier sweet option, it still contains natural sugar! Limit to 3 servings daily.  Drink at least 1 glass of water with each meal and aim for at least 8 glasses per day  Exercise at least 150 minutes every week.

## 2021-06-26 ENCOUNTER — Other Ambulatory Visit: Payer: Self-pay

## 2021-06-26 ENCOUNTER — Ambulatory Visit (HOSPITAL_BASED_OUTPATIENT_CLINIC_OR_DEPARTMENT_OTHER)
Admission: RE | Admit: 2021-06-26 | Discharge: 2021-06-26 | Disposition: A | Discharge status: Home / Self Care | Payer: BC Managed Care – PPO | Source: Ambulatory Visit | Attending: Family Medicine | Admitting: Family Medicine

## 2021-06-26 DIAGNOSIS — R1084 Generalized abdominal pain: Secondary | ICD-10-CM | POA: Diagnosis present

## 2021-06-26 MED ORDER — IOHEXOL 300 MG/ML  SOLN
100.0000 mL | Freq: Once | INTRAMUSCULAR | Status: AC | PRN
  Administered 2021-06-26: 100 mL via INTRAVENOUS

## 2021-09-27 ENCOUNTER — Other Ambulatory Visit: Payer: Self-pay | Admitting: Obstetrics and Gynecology

## 2021-09-27 DIAGNOSIS — Z1231 Encounter for screening mammogram for malignant neoplasm of breast: Secondary | ICD-10-CM

## 2021-12-05 ENCOUNTER — Ambulatory Visit
Admission: RE | Admit: 2021-12-05 | Discharge: 2021-12-05 | Disposition: A | Discharge status: Home / Self Care | Payer: 59 | Source: Ambulatory Visit | Attending: Obstetrics and Gynecology | Admitting: Obstetrics and Gynecology

## 2021-12-05 DIAGNOSIS — Z1231 Encounter for screening mammogram for malignant neoplasm of breast: Secondary | ICD-10-CM

## 2021-12-07 ENCOUNTER — Other Ambulatory Visit: Payer: Self-pay | Admitting: Obstetrics and Gynecology

## 2021-12-07 DIAGNOSIS — R928 Other abnormal and inconclusive findings on diagnostic imaging of breast: Secondary | ICD-10-CM

## 2021-12-13 ENCOUNTER — Other Ambulatory Visit: Payer: Self-pay | Admitting: Obstetrics and Gynecology

## 2021-12-13 ENCOUNTER — Ambulatory Visit
Admission: RE | Admit: 2021-12-13 | Discharge: 2021-12-13 | Disposition: A | Discharge status: Home / Self Care | Payer: 59 | Source: Ambulatory Visit | Attending: Obstetrics and Gynecology | Admitting: Obstetrics and Gynecology

## 2021-12-13 DIAGNOSIS — N6489 Other specified disorders of breast: Secondary | ICD-10-CM

## 2021-12-13 DIAGNOSIS — R928 Other abnormal and inconclusive findings on diagnostic imaging of breast: Secondary | ICD-10-CM

## 2021-12-16 ENCOUNTER — Ambulatory Visit
Admission: RE | Admit: 2021-12-16 | Discharge: 2021-12-16 | Disposition: A | Discharge status: Home / Self Care | Payer: 59 | Source: Ambulatory Visit | Attending: Obstetrics and Gynecology | Admitting: Obstetrics and Gynecology

## 2021-12-16 ENCOUNTER — Other Ambulatory Visit: Payer: Self-pay | Admitting: Obstetrics and Gynecology

## 2021-12-16 ENCOUNTER — Other Ambulatory Visit: Payer: Self-pay | Admitting: Radiology

## 2021-12-16 DIAGNOSIS — N6489 Other specified disorders of breast: Secondary | ICD-10-CM

## 2021-12-20 ENCOUNTER — Telehealth: Payer: Self-pay | Admitting: Hematology and Oncology

## 2021-12-20 NOTE — Telephone Encounter (Signed)
Scheduled appt per 8/21 staff msg from nurse navigator. Pt is aware of appt date and time. Pt is aware to arrive 15 mins prior to appt time and to bring and updated insurance card. Pt is aware of appt location.

## 2021-12-28 ENCOUNTER — Other Ambulatory Visit: Payer: Self-pay

## 2021-12-28 ENCOUNTER — Encounter: Payer: Self-pay | Admitting: Radiology

## 2021-12-28 ENCOUNTER — Ambulatory Visit: Payer: 59 | Attending: General Surgery | Admitting: Physical Therapy

## 2021-12-28 ENCOUNTER — Encounter: Payer: Self-pay | Admitting: *Deleted

## 2021-12-28 ENCOUNTER — Other Ambulatory Visit: Payer: Self-pay | Admitting: General Surgery

## 2021-12-28 ENCOUNTER — Inpatient Hospital Stay: Payer: 59 | Attending: Hematology and Oncology | Admitting: Hematology and Oncology

## 2021-12-28 ENCOUNTER — Encounter: Payer: Self-pay | Admitting: Physical Therapy

## 2021-12-28 DIAGNOSIS — Z170 Estrogen receptor positive status [ER+]: Secondary | ICD-10-CM | POA: Insufficient documentation

## 2021-12-28 DIAGNOSIS — Z79899 Other long term (current) drug therapy: Secondary | ICD-10-CM | POA: Diagnosis not present

## 2021-12-28 DIAGNOSIS — Z803 Family history of malignant neoplasm of breast: Secondary | ICD-10-CM | POA: Diagnosis not present

## 2021-12-28 DIAGNOSIS — R293 Abnormal posture: Secondary | ICD-10-CM | POA: Diagnosis present

## 2021-12-28 DIAGNOSIS — C50212 Malignant neoplasm of upper-inner quadrant of left female breast: Secondary | ICD-10-CM | POA: Diagnosis present

## 2021-12-28 DIAGNOSIS — N6489 Other specified disorders of breast: Secondary | ICD-10-CM

## 2021-12-28 MED ORDER — TAMOXIFEN CITRATE 10 MG PO TABS: 10.0000 mg | ORAL_TABLET | Freq: Every day | ORAL | 0 refills | Status: DC

## 2021-12-28 NOTE — Research (Signed)
[employer] 2021-05 - Specimen Collection Study to Evaluate Biomarkers in Subjects with Cancer    12/28/21  CONSENT INTRO:  Patient Samantha Pena was identified by Dr. Lindi Adie as a potential candidate for the above listed study.  This Clinical Research Coordinator met with Samantha Pena, [id number], on 12/28/21 in a manner and location that ensures patient privacy to discuss participation in the above listed research study.  Patient is Accompanied by her husband .  A copy of the informed consent document with embedded HIPAA language was provided to the patient.  Patient reads, speaks, and understands [place].   Patient was provided with the business card of this Coordinator and encouraged to contact the research team with any questions.  Approximately 10 minutes were spent with the patient reviewing the informed consent documents.  Patient was provided the option of taking informed consent documents home to review and was encouraged to review at their convenience with their support network, including other care providers. Patient took the consent documents home to review.  Will follow-up with patient in a couple of days. Patient will not start Tamoxifen until after further work-up. Patient and husband thanked for their time and consideration of the above mentioned study.   Carol Ada, RT(R)(T) Clinical Research Coordinator

## 2021-12-28 NOTE — Progress Notes (Signed)
[facility] NOTE  Patient Care Team: Marin Olp, MD as PCP - General (Family Medicine) Tiajuana Amass, MD as Referring Physician (Allergy and Immunology) Mauro Kaufmann, RN as Oncology Nurse Navigator Rockwell Germany, RN as Oncology Nurse Navigator Nicholas Lose, MD as Consulting Physician (Hematology and Oncology)  CHIEF COMPLAINTS/PURPOSE OF CONSULTATION:  Newly diagnosed breast cancer  HISTORY OF PRESENTING ILLNESS:  Samantha Pena 56 y.o. female is here because of recent diagnosis of left breast cancer.  Patient had a routine screening mammogram that detected left breast distortion calcifications but ultrasound revealed a 2 cm mass axilla was negative.  Ultrasound-guided biopsy came back as grade 2 IDC that was ER/PR positive HER2 negative Ki-67 10%.  She was seen by Dr. Donne Hazel who is setting her up for an MRI for further investigation.  Because of delays in the diagnostic work-up.  He would like [place] to start her on antiestrogen therapy.  She is premenopausal and is still undergoing menstrual cycles.  I reviewed her records extensively and collaborated the history with the patient.  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  Ultrasound at 11 o'clock position there was a 2 cm mass.  Axilla negative.  Ultrasound-guided biopsy revealed grade 2 invasive ductal carcinoma ER 95%, PR 100%, Ki-67 10%, HER2 negative   12/28/2021 Cancer Staging   Staging form: Breast, AJCC 8th Edition - Clinical: Stage IA (cT1c, cN0, cM0, G2, ER+, PR+, HER2-) - Signed by Nicholas Lose, MD on 12/28/2021 Stage prefix: Initial diagnosis Histologic grading system: 3 grade system      MEDICAL HISTORY:  Past Medical History:  Diagnosis Date    Anxiety    Cyst October 2012   2016 stable. near rectum that gives pt discomfort when sitting and has grown in size in the past year    Migraines     SURGICAL HISTORY: Past Surgical History:  Procedure Laterality Date   CERVICAL POLYPECTOMY  02/21/2021   uterus   CESAREAN SECTION  03/18/1999   FOOT SURGERY Right 09/30/2002   bone in toe had to be shaved and realigned    LAPAROSCOPIC APPENDECTOMY N/A 10/08/2020   Procedure: APPENDECTOMY LAPAROSCOPIC;  Surgeon: Rolm Bookbinder, MD;  Location: [facility];  Service: General;  Laterality: N/A;    SOCIAL HISTORY: Social History   Socioeconomic History   Marital status: Married    Spouse name: Not on file   Number of children: 2   Years of education: Not on file   Highest education level: Not on file  Occupational History   Not on file  Tobacco Use   Smoking status: Never   Smokeless tobacco: Never  Vaping Use   Vaping Use: Never used  Substance and Sexual Activity   Alcohol use: Yes    Alcohol/week: 0.0 standard drinks of alcohol    Comment: rare/social   Drug use: No   Sexual activity: Not on file  Other Topics Concern   Not on file  Social History Narrative   Married (mom Donny Pique, husband also patient here). 2 children- girls 19 and 16 in 2016.       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: family time, enjoys working outside, has new puppy in 2016   Social Determinants of Health  Financial Resource Strain: Not on file  Food Insecurity: Not on file  Transportation Needs: Not on file  Physical Activity: Not on file  Stress: Not on file  Social Connections: Not on file  Intimate Partner Violence: Not on file    FAMILY HISTORY: Family History  Problem Relation Age of Onset   Cancer Mother 33       breast cancer - passed from   Breast cancer Mother 102   Diabetes Father        17s   Hypertension Father    Breast cancer Sister 58   Epilepsy Sister    Hyperlipidemia Brother    Colon cancer Neg Hx     Esophageal cancer Neg Hx    Rectal cancer Neg Hx    Stomach cancer Neg Hx     ALLERGIES:  is allergic to no known allergies.  MEDICATIONS:  Current Outpatient Medications  Medication Sig Dispense Refill   tamoxifen (NOLVADEX) 10 MG tablet Take 1 tablet (10 mg total) by mouth daily. 30 tablet 0   amoxicillin-clavulanate (AUGMENTIN) 875-125 MG tablet Take 1 tablet by mouth 2 (two) times daily. 20 tablet 0   Calcium Carbonate (CHEWABLE CALCIUM PO) Take 500 mg by mouth daily.     fluticasone (FLONASE) 50 MCG/ACT nasal spray 1 spray in each nostril     Ibuprofen (MOTRIN PO) Take 1 tablet by mouth every 8 (eight) hours as needed. Pain     ipratropium (ATROVENT) 0.06 % nasal spray 2 sprays in each nostril     levocetirizine (XYZAL) 5 MG tablet 1 tablet in the evening     montelukast (SINGULAIR) 10 MG tablet 1 tablet     Multiple Vitamins-Minerals (MULTI FOR HER PO) Take by mouth daily.     oxybutynin (DITROPAN-XL) 10 MG 24 hr tablet Take 10 mg by mouth at bedtime.     SUMAtriptan (IMITREX) 20 MG/ACT nasal spray USE 1 SPRAY INTO EACH NOSTRIL EVERY 2 HOURS AS NEEDED (max twice per day) 6 each 11   tretinoin (RETIN-A) 0.025 % cream Apply 1 application topically at bedtime.     zolpidem (AMBIEN) 10 MG tablet 1 tablet at bedtime as needed     No current facility-administered medications for this visit.    REVIEW OF SYSTEMS:   Constitutional: Denies fevers, chills or abnormal night sweats Breast:  Denies any palpable lumps or discharge All other systems were reviewed with the patient and are negative.  PHYSICAL EXAMINATION: ECOG PERFORMANCE STATUS: 0 - Asymptomatic  Vitals:   12/28/21 1509  BP: 135/64  Pulse: 75  Resp: 18  Temp: 97.9 F (36.6 C)  SpO2: 100%   Filed Weights   12/28/21 1509  Weight: 154 lb 14.4 oz (70.3 kg)    GENERAL:alert, no distress and comfortable    LABORATORY DATA:  I have reviewed the data as listed Lab Results  Component Value Date   WBC 5.0  06/24/2021   HGB 12.8 06/24/2021   HCT 38.7 06/24/2021   MCV 85.1 06/24/2021   PLT 254.0 06/24/2021   Lab Results  Component Value Date   NA 139 06/24/2021   K 4.1 06/24/2021   CL 103 06/24/2021   CO2 31 06/24/2021    RADIOGRAPHIC STUDIES: I have personally reviewed the radiological reports and agreed with the findings in the report.  ASSESSMENT AND PLAN:  Malignant 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, estrogen receptor positive ([place]) 12/16/2021:Screening mammogram detected left breast distortion and calcifications in the central  upper quadrant.  Ultrasound at 11 o'clock position there was a 2 cm mass.  Axilla negative.  Ultrasound-guided biopsy revealed grade 2 invasive ductal carcinoma ER 95%, PR 100%, Ki-67 10%, HER2 negative  Pathology and radiology counseling:Discussed with the patient, the details of pathology including the type of breast cancer,the clinical staging, the significance of ER, PR and HER-2/neu receptors and the implications for treatment. After reviewing the pathology in detail, we proceeded to discuss the different treatment options between surgery, radiation, chemotherapy, antiestrogen therapies.  Recommendations: Breast MRI will be ordered to evaluate the extent and to recommend any additional biopsies.  1. Breast conserving surgery followed by 2. Oncotype DX testing to determine if chemotherapy would be of any benefit followed by 3. Adjuvant radiation therapy followed by 4. Adjuvant antiestrogen therapy with tamoxifen (starting at 10 mg daily on 12/29/2021) 5.  Genetic testing  Oncotype counseling: I discussed Oncotype DX test. I explained to the patient that this is a 21 gene panel to evaluate patient tumors DNA to calculate recurrence score. This would help determine whether patient has high risk or low risk breast cancer. She understands that if her tumor was found to be high risk, she would benefit from systemic chemotherapy. If low  risk, no need of chemotherapy.  Because of the delay in proceeding to surgery upfront, we would like her to start 10 mg of tamoxifen.  Return to clinic after surgery to discuss final pathology report and then determine if Oncotype DX testing will need to be sent.    All questions were answered. The patient knows to call the clinic with any problems, questions or concerns.    Harriette Ohara, MD 12/28/21

## 2021-12-28 NOTE — Assessment & Plan Note (Signed)
12/16/2021:Screening mammogram detected left breast distortion and calcifications in the central upper quadrant.  Ultrasound at 11 o'clock position there was a 2 cm mass.  Axilla negative.  Ultrasound-guided biopsy revealed grade 2 invasive ductal carcinoma ER 95%, PR 100%, Ki-67 10%, HER2 negative  Pathology and radiology counseling:Discussed with the patient, the details of pathology including the type of breast cancer,the clinical staging, the significance of ER, PR and HER-2/neu receptors and the implications for treatment. After reviewing the pathology in detail, we proceeded to discuss the different treatment options between surgery, radiation, chemotherapy, antiestrogen therapies.  Recommendations: Breast MRI will be ordered to evaluate the extent and to recommend any additional biopsies.  1. Breast conserving surgery followed by 2. Oncotype DX testing to determine if chemotherapy would be of any benefit followed by 3. Adjuvant radiation therapy followed by 4. Adjuvant antiestrogen therapy 5.  Genetic testing  Oncotype counseling: I discussed Oncotype DX test. I explained to the patient that this is a 21 gene panel to evaluate patient tumors DNA to calculate recurrence score. This would help determine whether patient has high risk or low risk breast cancer. She understands that if her tumor was found to be high risk, she would benefit from systemic chemotherapy. If low risk, no need of chemotherapy.  Return to clinic after surgery to discuss final pathology report and then determine if Oncotype DX testing will need to be sent.

## 2021-12-28 NOTE — Therapy (Signed)
OUTPATIENT PHYSICAL THERAPY BREAST CANCER BASELINE EVALUATION   Patient Name: Samantha Pena MRN: [id number] DOB:[birth year], 56 y.o., female Today's Date: 12/28/2021   PT End of Session - 12/28/21 1640     Visit Number 1    Number of Visits 2    Date for PT Re-Evaluation 02/22/22    PT Start Time 0962    PT Stop Time 1627    PT Time Calculation (min) 30 min    Activity Tolerance Patient tolerated treatment well    Behavior During Therapy [facility] for tasks assessed/performed             Past Medical History:  Diagnosis Date   Anxiety    Cyst October 2012   2016 stable. near rectum that gives pt discomfort when sitting and has grown in size in the past year    Migraines    Past Surgical History:  Procedure Laterality Date   CERVICAL POLYPECTOMY  02/21/2021   uterus   CESAREAN SECTION  03/18/1999   FOOT SURGERY Right 09/30/2002   bone in toe had to be shaved and realigned    LAPAROSCOPIC APPENDECTOMY N/A 10/08/2020   Procedure: APPENDECTOMY LAPAROSCOPIC;  Surgeon: Rolm Bookbinder, MD;  Location: [place];  Service: General;  Laterality: N/A;   Patient Active Problem List   Diagnosis Date Noted   Malignant 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, estrogen receptor positive ([place]) 12/28/2021   S/P laparoscopic appendectomy 10/08/2020   History of Lyme disease 11/12/2018   Urge incontinence 11/12/2017   Overweight 05/07/2017   Allergic rhinitis 05/07/2017   Hyperlipidemia 10/31/2016   TMJ disease 03/17/2015   Anal benign neoplasm 02/14/2011   ACTINIC KERATOSIS, CHEEK, LEFT 11/13/2007   Migraine headache 10/30/2006    REFERRING PROVIDER: Dr. Rolm Bookbinder  REFERRING DIAG: Left breast cancer  THERAPY DIAG:  Malignant 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, estrogen receptor positive ([place])  Abnormal posture  Rationale for Evaluation and Treatment Rehabilitation  ONSET DATE: 12/19/2021  SUBJECTIVE                                                                                                                                                                                            SUBJECTIVE STATEMENT: Patient reports she is here today to be seen by her medical team for her newly diagnosed left breast cancer.   PERTINENT HISTORY:  Patient was diagnosed on 12/19/2021 with left grade 2 invasive ductal carcinoma breast cancer. It measures 2 cm and is located in the upper inner quadrant. It is ER/PR positive and HER2 negative with a Ki67 of 10%.  PATIENT GOALS   reduce lymphedema risk and learn post op HEP.   PAIN:  Are you having pain? No   PRECAUTIONS: Active CA   HAND DOMINANCE: right  WEIGHT BEARING RESTRICTIONS No  FALLS:  Has patient fallen in last 6 months? No  LIVING ENVIRONMENT: Patient lives with: her husband Lives in: House/apartment Has following equipment at home: None  OCCUPATION: Works for [employer] at [employer]: She does not exercise regularly  [place] FUNCTION: Independent   OBJECTIVE  COGNITION:  Overall cognitive status: Within functional limits for tasks assessed    POSTURE:  Forward head and rounded shoulders posture  UPPER EXTREMITY AROM/PROM:  A/PROM RIGHT   eval   Shoulder extension 53  Shoulder flexion 145  Shoulder abduction 146  Shoulder internal rotation 78  Shoulder external rotation 84    (Blank rows = not tested)  A/PROM LEFT   eval  Shoulder extension 53  Shoulder flexion 141  Shoulder abduction 160  Shoulder internal rotation 57  Shoulder external rotation 72    (Blank rows = not tested)   CERVICAL AROM: All within normal limits  UPPER EXTREMITY STRENGTH: WNL   LYMPHEDEMA ASSESSMENTS:   LANDMARK RIGHT   eval  10 cm proximal to olecranon process 27.8  Olecranon process 25.3  10 cm proximal to ulnar styloid process 21.1  Just proximal to ulnar styloid process 16  Across hand at thumb web space 18.2  At base of 2nd  digit 6.2  (Blank rows = not tested)  LANDMARK LEFT   eval  10 cm proximal to olecranon process 28  Olecranon process 25  10 cm proximal to ulnar styloid process 20.7  Just proximal to ulnar styloid process 15.5  Across hand at thumb web space 17.5  At base of 2nd digit 5.9  (Blank rows = not tested)   L-DEX LYMPHEDEMA SCREENING:  The patient was assessed using the L-Dex machine today to produce a lymphedema index baseline score. The patient will be reassessed on a regular basis (typically every 3 months) to obtain new L-Dex scores. If the score is > 6.5 points away from his/her baseline score indicating onset of subclinical lymphedema, it will be recommended to wear a compression garment for 4 weeks, 12 hours per day and then be reassessed. If the score continues to be > 6.5 points from baseline at reassessment, we will initiate lymphedema treatment. Assessing in this manner has a 95% rate of preventing clinically significant lymphedema.   L-DEX FLOWSHEETS - 12/28/21 1600       L-DEX LYMPHEDEMA SCREENING   Measurement Type Unilateral    L-DEX MEASUREMENT EXTREMITY Upper Extremity    POSITION  Standing    DOMINANT SIDE Right    At Risk Side Left    BASELINE SCORE (UNILATERAL) -0.2              QUICK DASH SURVEY:  Katina Dung - 12/28/21 0001     Open a tight or new jar No difficulty    Do heavy household chores (wash walls, wash floors) No difficulty    Carry a shopping bag or briefcase No difficulty    Wash your back No difficulty    Use a knife to cut food No difficulty    Recreational activities in which you take some force or impact through your arm, shoulder, or hand (golf, hammering, tennis) No difficulty    During the past week, to what extent has your arm, shoulder or hand problem interfered  with your normal social activities with family, friends, neighbors, or groups? Not at all    During the past week, to what extent has your arm, shoulder or hand problem limited  your work or other regular daily activities Not at all    Arm, shoulder, or hand pain. None    Tingling (pins and needles) in your arm, shoulder, or hand None    Difficulty Sleeping No difficulty    DASH Score 0 %              PATIENT EDUCATION:  Education details: Lymphedema risk reduction and post op shoulder/posture HEP Person educated: Patient Education method: Explanation, Demonstration, Handout Education comprehension: Patient verbalized understanding and returned demonstration   HOME EXERCISE PROGRAM: Patient was instructed today in a home exercise program today for post op shoulder range of motion. These included active assist shoulder flexion in sitting, scapular retraction, wall walking with shoulder abduction, and hands behind head external rotation.  She was encouraged to do these twice a day, holding 3 seconds and repeating 5 times when permitted by her physician.   ASSESSMENT:  CLINICAL IMPRESSION: Patient was diagnosed on 12/19/2021 with left grade 2 invasive ductal carcinoma breast cancer. It measures 2 cm and is located in the upper inner quadrant. It is ER/PR positive and HER2 negative with a Ki67 of 10%. Her multidisciplinary medical team met prior to her assessments to determine a recommended treatment plan. She is planning to have possibly a left lumpectomy and sentinel node biopsy followed by radiation and anti-estrogen therapy. She will benefit from a post op PT reassessment to determine needs and from L-Dex screens every 3 months for 2 years to detect subclinical lymphedema.  Pt will benefit from skilled therapeutic intervention to improve on the following deficits: Decreased knowledge of precautions, impaired UE functional use, pain, decreased ROM, postural dysfunction.   PT treatment/interventions: ADL/self-care home management, pt/family education, therapeutic exercise  REHAB POTENTIAL: Excellent  CLINICAL DECISION MAKING:  Stable/uncomplicated  EVALUATION COMPLEXITY: Low   GOALS: Goals reviewed with patient? YES  LONG TERM GOALS: (STG=LTG)    Name Target Date Goal status  1 Pt will be able to verbalize understanding of pertinent lymphedema risk reduction practices relevant to her dx specifically related to skin care.  Baseline:  No knowledge 12/28/2021 Achieved at eval  2 Pt will be able to return demo and/or verbalize understanding of the post op HEP related to regaining shoulder ROM. Baseline:  No knowledge 12/28/2021 Achieved at eval  3 Pt will be able to verbalize understanding of the importance of attending the post op After Breast CA Class for further lymphedema risk reduction education and therapeutic exercise.  Baseline:  No knowledge 12/28/2021 Achieved at eval  4 Pt will demo she has regained full shoulder ROM and function post operatively compared to baselines.  Baseline: See objective measurements taken today. 02/22/2022      PLAN: PT FREQUENCY/DURATION: EVAL and 1 follow up appointment.   PLAN FOR NEXT SESSION: will reassess 3-4 weeks post op to determine needs.   Patient will follow up at outpatient cancer rehab 3-4 weeks following surgery.  If the patient requires physical therapy at that time, a specific plan will be dictated and sent to the referring physician for approval. The patient was educated today on appropriate basic range of motion exercises to begin post operatively and the importance of attending the After Breast Cancer class following surgery.  Patient was educated today on lymphedema risk reduction practices as it pertains  to recommendations that will benefit the patient immediately following surgery.  She verbalized good understanding.    Physical Therapy Information for After Breast Cancer Surgery/Treatment:  Lymphedema is a swelling condition that you may be at risk for in your arm if you have lymph nodes removed from the armpit area.  After a sentinel node biopsy, the risk  is approximately 5-9% and is higher after an axillary node dissection.  There is treatment available for this condition and it is not life-threatening.  Contact your physician or physical therapist with concerns. You may begin the 4 shoulder/posture exercises (see additional sheet) when permitted by your physician (typically a week after surgery).  If you have drains, you may need to wait until those are removed before beginning range of motion exercises.  A general recommendation is to not lift your arms above shoulder height until drains are removed.  These exercises should be done to your tolerance and gently.  This is not a "no pain/no gain" type of recovery so listen to your body and stretch into the range of motion that you can tolerate, stopping if you have pain.  If you are having immediate reconstruction, ask your plastic surgeon about doing exercises as he or she may want you to wait. We encourage you to attend the free one time ABC (After Breast Cancer) class offered by [facility].  You will learn information related to lymphedema risk, prevention and treatment and additional exercises to regain mobility following surgery.  You can call [phone] for more information.  This is offered the 1st and 3rd Monday of each month.  You only attend the class one time. While undergoing any medical procedure or treatment, try to avoid blood pressure being taken or needle sticks from occurring on the arm on the side of cancer.   This recommendation begins after surgery and continues for the rest of your life.  This may help reduce your risk of getting lymphedema (swelling in your arm). An excellent resource for those seeking information on lymphedema is the National Lymphedema Network's web site. It can be accessed at [facility] If you notice swelling in your hand, arm or breast at any time following surgery (even if it is many years from now), please contact your doctor or  physical therapist to discuss this.  Lymphedema can be treated at any time but it is easier for you if it is treated early on.  If you feel like your shoulder motion is not returning to normal in a reasonable amount of time, please contact your surgeon or physical therapist.  [facility] [phone]. [address], [place] [address]  ABC CLASS After Breast Cancer Class  After Breast Cancer Class is a specially designed exercise class to assist you in a safe recover after having breast cancer surgery.  In this class you will learn how to get back to full function whether your drains were just removed or if you had surgery a month ago.  This one-time class is held the 1st and 3rd Monday of every month from 11:00 a.m. until 12:00 noon virtually.  This class is FREE and space is limited. For more information or to register for the next available class, call [phone].  Class Goals  Understand specific stretches to improve the flexibility of you chest and shoulder. Learn ways to safely strengthen your upper body and improve your posture. Understand the warning signs of infection and why you may  be at risk for an arm infection. Learn about Lymphedema and prevention.  ** You do not attend this class until after surgery.  Drains must be removed to participate  Patient was instructed today in a home exercise program today for post op shoulder range of motion. These included active assist shoulder flexion in sitting, scapular retraction, wall walking with shoulder abduction, and hands behind head external rotation.  She was encouraged to do these twice a day, holding 3 seconds and repeating 5 times when permitted by her physician.  Annia Friendly, [place] 12/28/21 4:44 PM

## 2021-12-29 ENCOUNTER — Ambulatory Visit
Admission: RE | Admit: 2021-12-29 | Discharge: 2021-12-29 | Disposition: A | Discharge status: Home / Self Care | Payer: 59 | Source: Ambulatory Visit | Attending: General Surgery | Admitting: General Surgery

## 2021-12-29 ENCOUNTER — Ambulatory Visit: Payer: 59 | Admitting: Rehabilitation

## 2021-12-29 DIAGNOSIS — N6489 Other specified disorders of breast: Secondary | ICD-10-CM

## 2021-12-30 ENCOUNTER — Telehealth: Payer: Self-pay | Admitting: Emergency Medicine

## 2021-12-30 ENCOUNTER — Encounter: Payer: Self-pay | Admitting: *Deleted

## 2021-12-30 NOTE — Telephone Encounter (Signed)
[employer] 2021-05 - Specimen Collection Study to Evaluate Biomarkers in Subjects with Cancer   Called to f/u on patient's interest in study.  Pt has not had time to review consents, will f/u with her again mid next week.  Wells Guiles 'Learta CoddingNeysa Bonito, RN, BSN Clinical Research Nurse I 12/30/21 9:41 AM

## 2022-01-03 ENCOUNTER — Inpatient Hospital Stay: Payer: 59 | Attending: Hematology and Oncology | Admitting: Licensed Clinical Social Worker

## 2022-01-03 NOTE — Progress Notes (Signed)
**Note Samantha-Identified via Obfuscation** Samantha Pena  Initial Assessment   Samantha Pena is a 56 y.o. year old female contacted by phone. Clinical Social Pena was referred by new patient protocol for assessment of psychosocial needs.   SDOH (Social Determinants of Health) assessments performed: Yes SDOH Interventions    Flowsheet Row Clinical Support from 01/03/2022 in [facility]  SDOH Interventions   Food Insecurity Interventions Intervention Not Indicated  Housing Interventions Intervention Not Indicated  Transportation Interventions Intervention Not Indicated       SDOH Screenings   Food Insecurity: No Food Insecurity (01/03/2022)  Housing: Low Risk  (01/03/2022)  Transportation Needs: No Transportation Needs (01/03/2022)  Depression (PHQ2-9): Low Risk  (11/12/2018)  Tobacco Use: Low Risk  (12/28/2021)     Distress Screen completed: No     No data to display            Family/Social Information:  Housing Arrangement: patient lives with spouse Family members/support persons in your life? Family and Friends Transportation concerns: no  Employment: Working part time for [employer]. Not sure if she is eligible for FMLA based on hours worked Income source: Education officer, community concerns: No Type of concern: None Food access concerns: no Religious or spiritual practice: Not known Services Currently in place:  n/a  Coping/ Adjustment to diagnosis: Patient understands treatment plan and what happens next? yes, is anxious, especially about MRI/ small spaces Concerns about diagnosis and/or treatment:  General worries about cancer, anxiety with MRI Patient reported stressors: Anxiety/ nervousness and Adjusting to my illness Current coping skills/ strengths: Capable of independent living , Motivation for treatment/growth , and Supportive family/friends     SUMMARY: Current SDOH Barriers:  Stress related to illness  Clinical Social Pena Clinical Goal(s):   Demonstrate a reduction in symptoms related to :anxiety   Interventions: Discussed common feeling and emotions when being diagnosed with cancer, and the importance of support during treatment Informed patient of the support team roles and support services at [facility] Provided CSW contact information and encouraged patient to call with any questions or concerns Offered connection to counseling or groups- pt declined  and will utilize current supports   Follow Up Plan: Patient will contact CSW with any support or resource needs Patient verbalizes understanding of plan: Yes    Samantha Ringle E Willim Turnage, LCSW

## 2022-01-04 ENCOUNTER — Telehealth: Payer: Self-pay | Admitting: Emergency Medicine

## 2022-01-04 ENCOUNTER — Ambulatory Visit (HOSPITAL_COMMUNITY)
Admission: RE | Admit: 2022-01-04 | Discharge: 2022-01-04 | Disposition: A | Discharge status: Home / Self Care | Payer: 59 | Source: Ambulatory Visit | Attending: General Surgery | Admitting: General Surgery

## 2022-01-04 DIAGNOSIS — C50412 Malignant neoplasm of upper-outer quadrant of left female breast: Secondary | ICD-10-CM | POA: Insufficient documentation

## 2022-01-04 DIAGNOSIS — Z170 Estrogen receptor positive status [ER+]: Secondary | ICD-10-CM | POA: Diagnosis present

## 2022-01-04 MED ORDER — GADOBUTROL 1 MMOL/ML IV SOLN
7.0000 mL | Freq: Once | INTRAVENOUS | Status: AC | PRN
  Administered 2022-01-04: 7 mL via INTRAVENOUS

## 2022-01-04 NOTE — Telephone Encounter (Signed)
[employer] 2021-05 - Specimen Collection Study to Evaluate Biomarkers in Subjects with Cancer   Called to f/u on interest in study, no answer.  Left VM requesting call back from pt.  Wells Guiles 'Learta CoddingNeysa Bonito, RN, BSN Clinical Research Nurse I 01/04/22 3:10 PM

## 2022-01-05 ENCOUNTER — Encounter: Payer: Self-pay | Admitting: *Deleted

## 2022-01-06 ENCOUNTER — Ambulatory Visit: Payer: 59 | Admitting: Radiation Oncology

## 2022-01-06 ENCOUNTER — Telehealth: Payer: Self-pay | Admitting: Emergency Medicine

## 2022-01-06 NOTE — Progress Notes (Signed)
Location of Breast Cancer:Malignant 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, estrogen receptor positive    Histology per Pathology Report: 12/16/2021  Diagnosis Breast, left, needle core biopsy, UIQ, X clip - INVASIVE DUCTAL CARCINOMA, SEE NOTE - DUCTAL CARCINOMA IN SITU, INTERMEDIATE GRADE - TUBULE FORMATION: SCORE 3 - NUCLEAR PLEOMORPHISM: SCORE 2 - MITOTIC COUNT: SCORE 1 - TOTAL SCORE: 6 - OVERALL GRADE: 2 - LYMPHOVASCULAR INVASION: NOT IDENTIFIED - CANCER LENGTH: 0.6 CM - CALCIFICATIONS: NOT IDENTIFIED Results: GROUP 5: HER2 **NEGATIVE** Equivocal form of amplification of the HER2 gene was detected in the IHC 2+ tissue sample received from this individual. HER2 FISH was performed by a technologist and cell imaging and analysis on the [employer]. RATIO OF HER2/CEN17 SIGNALS 1.24 AVERAGE HER2 COPY NUMBER PER CELL 2.05 The ratio of HER2/CEN 17 is within the range < 2.0 of HER2/CEN 17 and a copy number of HER2 signals per cell is <4.0.  Receptor Status: ER(95% Positive), PR (100% Positive), Her2-neu (neg), Ki-67(10%)  Did patient present with symptoms (if so, please note symptoms) or was this found on screening mammography?: yes  Past/Anticipated interventions by surgeon, if any:  Past/AnticipatedBreast MRI will be ordered to evaluate the extent and to recommend any additional biopsies.   1. Breast conserving surgery followed by 2. Oncotype DX testing to determine if chemotherapy would be of any benefit followed by 3. Adjuvant radiation therapy followed by 4. Adjuvant antiestrogen therapy with tamoxifen (starting at 10 mg daily on 12/29/2021) 5.  Genetic testing interventions by medical oncology, if any:   Lymphedema issues, if any:  Unsure if it is swelling    Pain issues, if any:  States that she is having pain in her left shoulder   SAFETY ISSUES: Prior radiation? no Pacemaker/ICD? no Possible current pregnancy?no Is the patient on methotrexate?  no  Current Complaints / other details:

## 2022-01-06 NOTE — Telephone Encounter (Signed)
[employer] 2021-05 - Specimen Collection Study to Evaluate Biomarkers in Subjects with Cancer   Called to f/u on interest in study, no answer.  Left VM requesting call back.  Wells Guiles 'Learta CoddingNeysa Bonito, RN, BSN Clinical Research Nurse I 01/06/22 9:46 AM

## 2022-01-08 NOTE — Progress Notes (Signed)
Radiation Oncology         [phone] ________________________________  Initial Outpatient Consultation  MyChart Video Visit   Name: Samantha Pena MRN: [id number]  Date: 01/09/2022  DOB: 11/16/1965  [id number], Samantha Mars, MD  Nicholas Lose, MD   REFERRING PHYSICIAN: Nicholas Lose, MD  DIAGNOSIS:    ICD-10-CM   1. Malignant 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, estrogen receptor positive ([place])  C50.212    Z17.0      Stage IA (cT1c, cN0, cM0) Left Breast UIQ Invasive ductal carcinoma with intermediate grade DCIS, ER+ / PR+ / Her2-, Grade 2  CHIEF COMPLAINT: Here to discuss management of left breast cancer  HISTORY OF PRESENT ILLNESS::Samantha Pena is a 56 y.o. female who presented with a left breast abnormality on the following imaging: bilateral screening mammogram on the date of 12/05/21.  No symptoms, if any, were reported at that time. Diagnostic left breast mammogram and left breast ultrasound on 12/13/21 further revealed an indistinct hypoechoic mass in the 11 o'clock left breast, 3 cmfn, measuring approximately 2.0 cm. Imaging also showed a distortion associated with faint calcifications located in the upper inner anterior portion of the left breast, adjacent to the mass. No left axillary adenopathy was appreciated sonographically. Physical exam performed at the time of diagnostic imaging revealed a palpable firm mass in the 11 location of the breast 3 cmfn, correlating with mammographic and sonographic findings.   Biopsy of the 11 o'clock left breast on date of 12/16/21 showed grade 2 invasive ductal carcinoma and intermediate grade DCIS measuring 1.2 cm in the greatest linear extent, with focal PNI.  ER status: 95% positive with moderate-strong staining intensity; PR status 100% positive with strong staining intensity; Proliferation marker Ki67 at 10%; Her2 status negative; Grade 2.  Accordingly, the patient was referred to Dr. Donne Hazel on 12/28/21  who recommended proceeding with biopsy of the left breast distortion and MRI to plan for surgery. Biopsy of the left breast distortion was also warranted due to her dense breast tissue noted on examination, and her family history.   Biopsy of the upper inner distortion in the left breast on 12/29/21 showed grade 2 invasive ductal carcinoma with intermediate grade DCIS measuring 0.6 cm in the greatest linear extent.   Given delays in diagnostic work-up, Dr. Donne Hazel recommended initiating antiestrogen therapy in the mean time. Following discussion with Dr. Lindi Adie on 12/28/21, the patient consented to starting tamoxifen; first dose on 12/29/21. The patient is also agreeable to have Oncotype testing performed following surgery to determine the benefit of chemotherapy.   Pertinent imaging thus far includes a bilateral breast MRI on 01/04/22 which demonstrated the biopsy-proven invasive ductal carcinoma and DCIS in the upper inner quadrant of the left breast (measuring 4.7 x 3.4 x 3.1 cm) with some extension into the upper-outer quadrant. MRI also showed one left axillary lymph suspicious for metastatic carcinoma, with a cortical thickness of 4.96 mm, and multiple bilateral breast cysts. No abnormal findings were appreciated in the right breast.   Of note: The patient has a family history significant for breast cancer in her sister at age 58, and in her mother at the age of 67. She has agreed to pursue genetic testing.   ***  PREVIOUS RADIATION THERAPY: No  PAST MEDICAL HISTORY:  has a past medical history of Anxiety, Cyst (October 2012), and Migraines.    PAST SURGICAL HISTORY: Past Surgical History:  Procedure Laterality Date   CERVICAL POLYPECTOMY  02/21/2021  uterus   CESAREAN SECTION  03/18/1999   FOOT SURGERY Right 09/30/2002   bone in toe had to be shaved and realigned    LAPAROSCOPIC APPENDECTOMY N/A 10/08/2020   Procedure: APPENDECTOMY LAPAROSCOPIC;  Surgeon: Rolm Bookbinder, MD;   Location: [facility];  Service: General;  Laterality: N/A;    FAMILY HISTORY: family history includes Breast cancer (age of onset: 28) in her sister; Breast cancer (age of onset: 5) in her mother; Cancer (age of onset: 26) in her mother; Diabetes in her father; Epilepsy in her sister; Hyperlipidemia in her brother; Hypertension in her father.  SOCIAL HISTORY:  reports that she has never smoked. She has never used smokeless tobacco. She reports current alcohol use. She reports that she does not use drugs.  ALLERGIES: No known allergies  MEDICATIONS:  Current Outpatient Medications  Medication Sig Dispense Refill   amoxicillin-clavulanate (AUGMENTIN) 875-125 MG tablet Take 1 tablet by mouth 2 (two) times daily. 20 tablet 0   Calcium Carbonate (CHEWABLE CALCIUM PO) Take 500 mg by mouth daily.     fluticasone (FLONASE) 50 MCG/ACT nasal spray 1 spray in each nostril     Ibuprofen (MOTRIN PO) Take 1 tablet by mouth every 8 (eight) hours as needed. Pain     ipratropium (ATROVENT) 0.06 % nasal spray 2 sprays in each nostril     levocetirizine (XYZAL) 5 MG tablet 1 tablet in the evening     montelukast (SINGULAIR) 10 MG tablet 1 tablet     Multiple Vitamins-Minerals (MULTI FOR HER PO) Take by mouth daily.     oxybutynin (DITROPAN-XL) 10 MG 24 hr tablet Take 10 mg by mouth at bedtime.     SUMAtriptan (IMITREX) 20 MG/ACT nasal spray USE 1 SPRAY INTO EACH NOSTRIL EVERY 2 HOURS AS NEEDED (max twice per day) 6 each 11   tamoxifen (NOLVADEX) 10 MG tablet Take 1 tablet (10 mg total) by mouth daily. 30 tablet 0   tretinoin (RETIN-A) 0.025 % cream Apply 1 application topically at bedtime.     zolpidem (AMBIEN) 10 MG tablet 1 tablet at bedtime as needed     No current facility-administered medications for this encounter.    REVIEW OF SYSTEMS: As above in HPI.   PHYSICAL EXAM:  vitals were not taken for this visit.   General: Alert and oriented, in no acute distress HEENT: Head is normocephalic. Extraocular  movements are intact. Oropharynx is clear. Neck: Neck is supple, no palpable cervical or supraclavicular lymphadenopathy. Heart: Regular in rate and rhythm with no murmurs, rubs, or gallops. Chest: Clear to auscultation bilaterally, with no rhonchi, wheezes, or rales. Abdomen: Soft, nontender, nondistended, with no rigidity or guarding. Extremities: No cyanosis or edema. Lymphatics: see Neck Exam Skin: No concerning lesions. Musculoskeletal: symmetric strength and muscle tone throughout. Neurologic: Cranial nerves II through XII are grossly intact. No obvious focalities. Speech is fluent. Coordination is intact. Psychiatric: Judgment and insight are intact. Affect is appropriate. Breasts: *** . No other palpable masses appreciated in the breasts or axillae *** .    ECOG = ***  0 - Asymptomatic (Fully active, able to carry on all predisease activities without restriction)  1 - Symptomatic but completely ambulatory (Restricted in physically strenuous activity but ambulatory and able to carry out work of a light or sedentary nature. For example, light housework, office work)  2 - Symptomatic, <50% in bed during the day (Ambulatory and capable of all self care but unable to carry out any work activities. Up and about  more than 50% of waking hours)  3 - Symptomatic, >50% in bed, but not bedbound (Capable of only limited self-care, confined to bed or chair 50% or more of waking hours)  4 - Bedbound (Completely disabled. Cannot carry on any self-care. Totally confined to bed or chair)  5 - Death   Eustace Pen MM, Creech RH, Tormey DC, et al. [phone]). "Toxicity and response criteria of the [facility]". [place] Oncol. 5 (6): 649-55   LABORATORY DATA:  Lab Results  Component Value Date   WBC 5.0 06/24/2021   HGB 12.8 06/24/2021   HCT 38.7 06/24/2021   MCV 85.1 06/24/2021   PLT 254.0 06/24/2021   CMP     Component Value Date/Time   NA 139 06/24/2021 1152   K 4.1  06/24/2021 1152   CL 103 06/24/2021 1152   CO2 31 06/24/2021 1152   GLUCOSE 120 (H) 06/24/2021 1152   BUN 15 06/24/2021 1152   CREATININE 0.95 06/24/2021 1152   CALCIUM 9.3 06/24/2021 1152   PROT 6.8 06/24/2021 1152   ALBUMIN 4.5 06/24/2021 1152   AST 15 06/24/2021 1152   ALT 8 06/24/2021 1152   ALKPHOS 34 (L) 06/24/2021 1152   BILITOT 0.4 06/24/2021 1152   GFRNONAA >60 10/08/2020 1440   GFRAA 102 05/06/2008 0828         RADIOGRAPHY: MR BREAST BILATERAL W WO CONTRAST INC CAD  Addendum Date: 01/07/2022   ADDENDUM REPORT: 01/07/2022 17:48 ADDENDUM: The level 1 left axillary lymph node has a cortical thickness of 4.96 mm, not 49.6 mm. Electronically Signed   By: Claudie Revering M.D.   On: 01/07/2022 17:48   Result Date: 01/07/2022 CLINICAL DATA:  Recently diagnosed invasive ductal carcinoma and DCIS in the 11 o'clock position of the left breast marked with a ribbon shaped biopsy marker clip at the time of ultrasound-guided core needle biopsy on 12/16/2021. The patient had a subsequent 3D stereotactic guided core needle biopsy of the center of distortion in that area of the breast, marked with an X shaped biopsy marker clip, also demonstrating invasive ductal carcinoma and DCIS. The 2 clips were located 2.1 cm apart on the post clip placement left diagnostic mammogram dated 12/29/2021. EXAM: BILATERAL BREAST MRI WITH AND WITHOUT CONTRAST TECHNIQUE: Multiplanar, multisequence MR images of both breasts were obtained prior to and following the intravenous administration of 7 ml of Gadavist Three-dimensional MR images were rendered by post-processing of the original MR data on an independent workstation. The three-dimensional MR images were interpreted, and findings are reported in the following complete MRI report for this study. Three dimensional images were evaluated at the independent interpreting workstation using the DynaCAD thin client. COMPARISON:  Recent mammogram, ultrasound and biopsy  examinations. FINDINGS: Breast composition: d. Extreme fibroglandular tissue. Background parenchymal enhancement: Mild nodular background enhancement. Right breast: Multiple cysts. No mass or abnormal enhancement suspicious for malignancy. Left breast: Multiple cysts. The clip artifacts can not be distinguished from post biopsy changes. There is patchy non mass enhancement in the upper inner quadrant of the breast with a small area of extension into the upper-outer quadrant of the breast, spanning 4.7 x 3.1 cm on image number 66/8 and 3.4 cm in cephalo caudal dimension. This includes the area of post biopsy changes and has persistent enhancement kinetics. No findings elsewhere in the left breast suspicious for malignancy. Lymph nodes: Single level 1 left axillary lymph node with eccentric cortical thickening measuring 49.6 mm in thickness on image number 35/7.  Ancillary findings:  None. IMPRESSION: 1. 4.7 x 3.4 x 3.1 cm area of biopsy-proven invasive ductal carcinoma and DCIS in the upper inner quadrant of the left breast with a small extension into the upper-outer quadrant. 2. On the recent post clip left mammogram images, the biopsy marker clips were located 2.1 cm apart. Therefore, based on the size of the abnormality on the MRI, these would not be adequate for bracketed localization. 3. Single level 1 left axillary lymph node suspicious for a metastatic node. If clinically indicated, this could be re-evaluated with a repeat left axillary ultrasound and ultrasound-guided core needle biopsy. 4. No evidence of malignancy on the right. 5. Multiple bilateral breast cysts. RECOMMENDATION: Treatment plan. BI-RADS CATEGORY  6: Known biopsy-proven malignancy. Electronically Signed: By: Claudie Revering M.D. On: 01/05/2022 15:10   MM LT BREAST BX W LOC DEV 1ST LESION IMAGE BX SPEC STEREO GUIDE  Addendum Date: 01/03/2022   ADDENDUM REPORT: 01/03/2022 11:07 ADDENDUM: Pathology revealed GRADE 2 INVASIVE DUCTAL CARCINOMA, -  DUCTAL CARCINOMA IN SITU, INTERMEDIATE GRADE of the LEFT breast, upper inner quadrant (X clip). This was found to be concordant by Dr. Claudie Revering. Pathology results were discussed with the patient by telephone. The patient reported doing well after the biopsy with tenderness at the site. Post biopsy instructions and care were reviewed and questions were answered. The patient was encouraged to call [facility] for any additional concerns. The patient has a recent diagnosis of LEFT breast cancer and should follow her outlined treatment plan. Recommendation: Bilateral breast MRI for extent of disease. (scheduled for January 04, 2022) Pathology results reported by Stacie Acres RN on 12/30/2021. Electronically Signed   By: Claudie Revering M.D.   On: 01/03/2022 11:07   Result Date: 01/03/2022 CLINICAL DATA:  Distortion in the upper inner quadrant of the left breast at recent mammography with associated pleomorphic calcifications. Ultrasound-guided core needle biopsy was recently performed of palpable mass in that area of the breast, demonstrating invasive ductal carcinoma and DCIS. The ribbon shaped biopsy marker clip placed at the time of biopsy was located 2 cm posterior and 1.9 cm superior to center of distortion seen mammographically. The patient is scheduled for 3D stereotactic guided core needle biopsy of the center of distortion today. EXAM: LEFT BREAST STEREOTACTIC CORE NEEDLE BIOPSY COMPARISON:  Previous exam(s). FINDINGS: The patient and I discussed the procedure of stereotactic-guided biopsy including benefits and alternatives. We discussed the high likelihood of a successful procedure. We discussed the risks of the procedure including infection, bleeding, tissue injury, clip migration, and inadequate sampling. Informed written consent was given. The usual time out protocol was performed immediately prior to the procedure. Using sterile technique and 1% Lidocaine as local anesthetic,  under stereotactic guidance, a 9 gauge vacuum assisted device was used to perform core needle biopsy of the center of distortion with calcifications in the upper inner quadrant of the left breast using a cephalad approach. Specimen radiograph was performed showing multiple calcifications in multiple specimens. Specimens with calcifications are identified for pathology. Lesion quadrant: Upper inner quadrant At the conclusion of the procedure, an X shaped tissue marker clip was deployed into the biopsy cavity. Follow-up 2-view mammogram was performed and dictated separately. IMPRESSION: Stereotactic-guided biopsy of the center of distortion with calcifications in the upper inner quadrant of the left breast. No apparent complications. Electronically Signed: By: Claudie Revering M.D. On: 12/29/2021 10:14  MM CLIP PLACEMENT LEFT  Result Date: 12/29/2021 CLINICAL DATA:  Status  post 3D stereotactic guided core needle biopsy of the center of an area of focal distortion with calcifications in the upper inner quadrant of the left breast, marked with an X shaped biopsy marker clip. The patient had a recent ultrasound-guided core needle biopsy of a mass in the 11 o'clock position of the left breast marked with a ribbon shaped clip demonstrating invasive ductal carcinoma and DCIS. EXAM: 3D DIAGNOSTIC LEFT MAMMOGRAM POST STEREOTACTIC BIOPSY COMPARISON:  Previous exam(s). FINDINGS: 3D Mammographic images were obtained following 3D stereotactic guided biopsy of the center of focal distortion in the upper inner quadrant of the left breast. The biopsy marking clip is in expected position at the site of biopsy. In the craniocaudal projection, this is at the location of the center of distortion and is located 2.1 cm anterior to the ribbon shaped clip. In the lateral projection, X shaped clip is located 1.1 cm inferior and slightly posterior to the center of distortion and is located 0.9 cm inferior to the ribbon shaped clip.  IMPRESSION: 1.1 cm of inferior and slightly posterior migration of the X shaped biopsy marker clip relative to the biopsied center of distortion in the upper-outer quadrant of the left breast. If clinically indicated based on treatment decisions, the 2 clips could be used for preoperative radioactive seed bracketing. Final Assessment: Post Procedure Mammograms for Marker Placement Electronically Signed   By: Claudie Revering M.D.   On: 12/29/2021 10:41  [place] LT BREAST BX W LOC DEV 1ST LESION IMG BX SPEC US GUIDE  Addendum Date: 12/26/2021   ADDENDUM REPORT: 12/26/2021 09:40 ADDENDUM: Pathology revealed GRADE 2 INVASIVE MAMMARY CARCINOMA, MAMMARY CARCINOMA IN SITU, INTERMEDIATE GRADE. FOCAL PERINEURAL INVASION of the LEFT breast, 11 o'clock (ribbon clip). Immunohistochemistry for E-cadherin is positive consistent with ductal carcinoma. This was found to be concordant by Dr. Dorise Bullion. Pathology results were discussed with the patient by Dr. Louretta Shorten. The patient reported doing well after the biopsy with tenderness and bruising at the site. Post biopsy instructions and care were reviewed and questions were answered. The patient was encouraged to call [facility] for any additional concerns. Recommend breast MRI to assess extent of disease, family history, and density. If breast conservation considered, recommend additional biopsies based on MRI or remaining distortion 2 cm from today's biopsy clip. Per patient request, surgical consultation has been arranged with Dr. Rolm Bookbinder at [facility] on August 30,2023. Per patient request, medical oncologist consultation has been arranged with Dr. Nicholas Lose at [facility] on December 28, 2021. Pathology results reported by Stacie Acres RN on 12/20/2021. Electronically Signed   By: Dorise Bullion III M.D.   On: 12/26/2021 09:40   Result Date: 12/26/2021 CLINICAL DATA:  Biopsy of left breast  mass/distortion EXAM: ULTRASOUND GUIDED LEFT BREAST CORE NEEDLE BIOPSY COMPARISON:  Previous exam(s). PROCEDURE: I met with the patient and we discussed the procedure of ultrasound-guided biopsy, including benefits and alternatives. We discussed the high likelihood of a successful procedure. We discussed the risks of the procedure, including infection, bleeding, tissue injury, clip migration, and inadequate sampling. Informed written consent was given. The usual time-out protocol was performed immediately prior to the procedure. Lesion quadrant: 11 o'clock left breast Using sterile technique and 1% Lidocaine as local anesthetic, under direct ultrasound visualization, a 14 gauge spring-loaded device was used to perform biopsy of left breast mass/distortion using a lateral approach. At the conclusion of the procedure a ribbon shaped tissue marker  clip was deployed into the biopsy cavity. Follow up 2 view mammogram was performed and dictated separately. IMPRESSION: Ultrasound guided biopsy of left breast mass/distortion. No apparent complications. Electronically Signed: By: Dorise Bullion III M.D. On: 12/16/2021 10:45  MM CLIP PLACEMENT LEFT  Result Date: 12/16/2021 CLINICAL DATA:  Evaluate biopsy marker EXAM: 3D DIAGNOSTIC LEFT MAMMOGRAM POST ULTRASOUND BIOPSY COMPARISON:  Previous exam(s). FINDINGS: 3D Mammographic images were obtained following ultrasound guided biopsy of left breast distortion. The ribbon shaped biopsy marker is located 2 cm posterior and 1.9 cm superior to the distortion. IMPRESSION: The ribbon shaped clip is located 2 cm posterior and 1.9 cm superior to the distortion. The sonographic and mammographic findings did not correlate. Final Assessment: Post Procedure Mammograms for Marker Placement Electronically Signed   By: Dorise Bullion III M.D.   On: 12/16/2021 11:09  MM DIAG BREAST TOMO UNI LEFT  Result Date: 12/13/2021 CLINICAL DATA:  Patient returns after screening study for  evaluation of possible LEFT breast distortion. Patient denies any prior breast surgery. EXAM: DIGITAL DIAGNOSTIC UNILATERAL LEFT MAMMOGRAM WITH TOMOSYNTHESIS; ULTRASOUND LEFT BREAST LIMITED TECHNIQUE: Left digital diagnostic mammography and breast tomosynthesis was performed.; Targeted ultrasound examination of the left breast was performed. COMPARISON:  Previous exam(s). ACR Breast Density Category c: The breast tissue is heterogeneously dense, which may obscure small masses. FINDINGS: Additional 2-D and 3-D images are performed. These views confirm presence of distortion associated with faint calcifications in the UPPER INNER anterior portion of the LEFT breast. There is an adjacent circumscribed oval mass. On physical exam, I palpate a firm mass in the 11 location of the LEFT breast 3 centimeters from the nipple. There are no visible scars in the LEFT breast. Targeted ultrasound is performed, showing an indistinct hypoechoic mass with posterior acoustic shadowing in the 11 o'clock location of the LEFT breast 3 centimeters from the nipple corresponding to the palpable mass. Although difficult to measure, this area is estimated to measure approximately 2.0 centimeters. Real-time exam confirms presence of distortion in this region. Evaluation of the LEFT axilla is negative for adenopathy. IMPRESSION: 1. Persistent distortion associated pleomorphic calcifications, palpable abnormality and indistinct mass on ultrasound. Mass is estimated to measure approximately 2.0 centimeters on ultrasound. 2. No LEFT axillary adenopathy. RECOMMENDATION: Recommend ultrasound-guided core biopsy of LEFT breast mass. I have discussed the findings and recommendations with the patient. If applicable, a reminder letter will be sent to the patient regarding the next appointment. BI-RADS CATEGORY  4: Suspicious. Electronically Signed   By: Nolon Nations M.D.   On: 12/13/2021 14:40  US BREAST LTD UNI LEFT INC AXILLA  Result Date:  12/13/2021 CLINICAL DATA:  Patient returns after screening study for evaluation of possible LEFT breast distortion. Patient denies any prior breast surgery. EXAM: DIGITAL DIAGNOSTIC UNILATERAL LEFT MAMMOGRAM WITH TOMOSYNTHESIS; ULTRASOUND LEFT BREAST LIMITED TECHNIQUE: Left digital diagnostic mammography and breast tomosynthesis was performed.; Targeted ultrasound examination of the left breast was performed. COMPARISON:  Previous exam(s). ACR Breast Density Category c: The breast tissue is heterogeneously dense, which may obscure small masses. FINDINGS: Additional 2-D and 3-D images are performed. These views confirm presence of distortion associated with faint calcifications in the UPPER INNER anterior portion of the LEFT breast. There is an adjacent circumscribed oval mass. On physical exam, I palpate a firm mass in the 11 location of the LEFT breast 3 centimeters from the nipple. There are no visible scars in the LEFT breast. Targeted ultrasound is performed, showing an indistinct hypoechoic mass with posterior  acoustic shadowing in the 11 o'clock location of the LEFT breast 3 centimeters from the nipple corresponding to the palpable mass. Although difficult to measure, this area is estimated to measure approximately 2.0 centimeters. Real-time exam confirms presence of distortion in this region. Evaluation of the LEFT axilla is negative for adenopathy. IMPRESSION: 1. Persistent distortion associated pleomorphic calcifications, palpable abnormality and indistinct mass on ultrasound. Mass is estimated to measure approximately 2.0 centimeters on ultrasound. 2. No LEFT axillary adenopathy. RECOMMENDATION: Recommend ultrasound-guided core biopsy of LEFT breast mass. I have discussed the findings and recommendations with the patient. If applicable, a reminder letter will be sent to the patient regarding the next appointment. BI-RADS CATEGORY  4: Suspicious. Electronically Signed   By: Nolon Nations M.D.   On:  12/13/2021 14:40     IMPRESSION/PLAN: ***   It was a pleasure meeting the patient today. We discussed the risks, benefits, and side effects of radiotherapy. I recommend radiotherapy to the *** to reduce her risk of locoregional recurrence by 2/3.  We discussed that radiation would take approximately *** weeks to complete and that I would give the patient a few weeks to heal following surgery before starting treatment planning. *** If chemotherapy were to be given, this would precede radiotherapy. We spoke about acute effects including skin irritation and fatigue as well as much less common late effects including internal organ injury or irritation. We spoke about the latest technology that is used to minimize the risk of late effects for patients undergoing radiotherapy to the breast or chest wall. No guarantees of treatment were given. The patient is enthusiastic about proceeding with treatment. I look forward to participating in the patient's care.  I will await her referral back to me for postoperative follow-up and eventual CT simulation/treatment planning.  On date of service, in total, I spent *** minutes on this encounter. Patient was seen in person.   __________________________________________   Eppie Gibson, MD  This document serves as a record of services personally performed by Eppie Gibson, MD. It was created on her behalf by Roney Mans, a trained medical scribe. The creation of this record is based on the scribe's personal observations and the provider's statements to them. This document has been checked and approved by the attending provider.

## 2022-01-09 ENCOUNTER — Encounter: Payer: Self-pay | Admitting: Radiation Oncology

## 2022-01-09 ENCOUNTER — Ambulatory Visit
Admission: RE | Admit: 2022-01-09 | Discharge: 2022-01-09 | Disposition: A | Discharge status: Home / Self Care | Payer: 59 | Source: Ambulatory Visit | Attending: Radiation Oncology | Admitting: Radiation Oncology

## 2022-01-09 ENCOUNTER — Other Ambulatory Visit: Payer: Self-pay

## 2022-01-09 DIAGNOSIS — C50212 Malignant neoplasm of upper-inner quadrant of left female breast: Secondary | ICD-10-CM

## 2022-01-10 ENCOUNTER — Encounter: Payer: Self-pay | Admitting: *Deleted

## 2022-01-10 ENCOUNTER — Other Ambulatory Visit: Payer: Self-pay | Admitting: General Surgery

## 2022-01-10 DIAGNOSIS — R9389 Abnormal findings on diagnostic imaging of other specified body structures: Secondary | ICD-10-CM

## 2022-01-15 ENCOUNTER — Other Ambulatory Visit: Payer: 59

## 2022-01-17 ENCOUNTER — Encounter: Payer: Self-pay | Admitting: *Deleted

## 2022-01-18 ENCOUNTER — Ambulatory Visit
Admission: RE | Admit: 2022-01-18 | Discharge: 2022-01-18 | Disposition: A | Discharge status: Home / Self Care | Payer: 59 | Source: Ambulatory Visit | Attending: General Surgery | Admitting: General Surgery

## 2022-01-18 DIAGNOSIS — R9389 Abnormal findings on diagnostic imaging of other specified body structures: Secondary | ICD-10-CM

## 2022-01-18 MED ORDER — GADOBUTROL 1 MMOL/ML IV SOLN
7.0000 mL | Freq: Once | INTRAVENOUS | Status: AC | PRN
  Administered 2022-01-18: 7 mL via INTRAVENOUS

## 2022-01-19 ENCOUNTER — Encounter: Payer: Self-pay | Admitting: *Deleted

## 2022-01-20 ENCOUNTER — Other Ambulatory Visit: Payer: Self-pay | Admitting: General Surgery

## 2022-01-20 ENCOUNTER — Other Ambulatory Visit: Payer: Self-pay | Admitting: Hematology and Oncology

## 2022-01-23 ENCOUNTER — Encounter: Payer: Self-pay | Admitting: *Deleted

## 2022-01-27 ENCOUNTER — Other Ambulatory Visit: Payer: Self-pay | Admitting: General Surgery

## 2022-01-27 ENCOUNTER — Encounter: Payer: Self-pay | Admitting: *Deleted

## 2022-01-27 NOTE — H&P (Signed)
Subjective:     Patient ID: Samantha Pena is a 56 y.o. female.   HPI   Returns for follow up discussion breast reconstruction. Presented following screening MMG with left breast distortion. Exam demonstrated palpable mass. Diagnostic MMG/US showed a 2 cm mass 11 o clock,  3 cmfn, corresponding to the palpable mass. Persistent distortion with calcs noted. Axilla normal. Biopsies labeled left breast 11 o clock showed IDC with DCIS, left breast UIQ IDC with DCIS, left breast, ER/PR+, Her2-.   MRI demonstrated left breast UIQ NME with an area of extension into the UOQ, spanning 4.7 x 3.1 cm x 3.4 cm. A single left axillary LN noted to have eccentric thickening. MR guided biopsy labeled left UOQ shoed IDC with DCIS, +LVI.   Given span of disease mastectomy recommended. Has discussed NSM with Dr. Donne Hazel. Started tamoxifen preop as surgical planning made. Plan Oncotype on surgical specimen.   Mother passed from breast cancer. Sister with breast ca age 75. Prior genetic testing negative. Mother had lumpectomy, sister had lumpectomy followed by mastectomy. Patient believes she had expander placement, note her sister has epilepsy and does not recall all details of her medical history.   Current 36 B. Wt down 10 lb since time of diagnosis.   Works at [employer] part time. Lives with spouse. Has one adult son in [place] and another in [place].   Review of Systems Remainder 12 point review negative    Objective:   Physical Exam Cardiovascular:     Rate and Rhythm: Normal rate and regular rhythm.     Heart sounds: Normal heart sounds.  Pulmonary:     Effort: Pulmonary effort is normal.     Breath sounds: Normal breath sounds.  Skin:    Comments: Fitzpatrick 2     Breasts: grade 1 ptosis bilateral SN to nipple R 25 L 25 cm BW R 18 L 18 cm CW 13 cm Nipple to IMF R 8 L 8 cm      Assessment:     Left breast cancer UIQ ER+    Plan:     Plan left NSM with tissue expander acellular  dermis reconstruction. Hold tamoxifen.    Reviewed immediate vs delayed, autologous vs implant based reconstruction. Reviewed incisions, drains, OR length, hospital stay and recovery for each. Discussed process of expansion and implant based risks including rupture, MRI surveillance for silicone implants, infection requiring surgery or removal, contracture. Discussed asymmetry one can expect with implant unilateral and natural breast on opposite. Discussed TRAM vs DIEP, latter would require treatment at tertiary center. Discussed abdominal based complications including failure flap, abdominal bulge or hernia. Discussed future surgery dependent on adjuvant treatments. Discussed expander placement does not preclude future autologous reconstruction. Though expander is not absolutely necessary to achieve final purely autologous reconstruction.   Reviewed reconstruction will be asensate and not stimulate. Reviewed risks mastectomy flap necrosis requiring additional surgery.    Discussed use of acellular dermis in reconstruction, cadaveric source, incorporation over several weeks, risk that if has seroma or infection can act as additional nidus for infection if not incorporated. Reviewed this is off label use of ADM.   Discussed prepectoral vs sub pectoral reconstruction. Discussed with patient and benefit of this is no animation deformity, may be less pain. Risk may be more visible rippling over upper poles, greater need of ADM. Reviewed pre pectoral would require larger amount acellular dermis, more drains. Discussed any type reconstruction also risks long term displacement implant and visible  rippling. If prepectoral counseled I would recommend she be comfortable with silicone implants as more options that have less rippling. She agrees to prepectoral placement.   Reviewed additional risks including but not limited to risks mastectomy flap necrosis requiring additional surgery, seroma, hematoma, asymmetry,  need to additional procedures, fat necrosis, DVT/PE, damage to adjacent structures, cardiopulmonary complications   Drain teaching completed. Rx for oxycodone Bactrim and Robaxin given.

## 2022-01-30 ENCOUNTER — Encounter (HOSPITAL_COMMUNITY): Payer: Self-pay | Admitting: General Surgery

## 2022-01-30 ENCOUNTER — Other Ambulatory Visit: Payer: Self-pay

## 2022-01-30 NOTE — Progress Notes (Signed)
PCP - Dr. Garret Reddish  ERAS Protcol - Clears until 0630  Anesthesia review: N  Patient verbally denies any shortness of breath, fever, cough and chest pain during phone call   -------------  SDW INSTRUCTIONS given:  Your procedure is scheduled on 01/31/22.  Report to [facility] Main Entrance "A" at 0700 A.M., and check in at the Admitting office.  Call this number if you have problems the morning of surgery:  [phone]   Remember:  Do not eat after midnight the night before your surgery  You may drink clear liquids until 0630 the morning of your surgery.   Clear liquids allowed are: Water, Non-Citrus Juices (without pulp), Carbonated Beverages, Clear Tea, Black Coffee Only, and Gatorade    Take these medicines the morning of surgery with A SIP OF WATER  fluticasone (FLONASE)  montelukast (SINGULAIR) SUMAtriptan (IMITREX) -if needed  As of today, STOP taking any Aspirin (unless otherwise instructed by your surgeon) Aleve, Naproxen, Ibuprofen, Motrin, Advil, Goody's, BC's, all herbal medications, fish oil, and all vitamins.                      Do not wear jewelry, make up, or nail polish            Do not wear lotions, powders, perfumes/colognes, or deodorant.            Do not shave 48 hours prior to surgery.  Men may shave face and neck.            Do not bring valuables to the hospital.            [facility] is not responsible for any belongings or valuables.  Do NOT Smoke (Tobacco/Vaping) 24 hours prior to your procedure If you use a CPAP at night, you may bring all equipment for your overnight stay.   Contacts, glasses, dentures or bridgework may not be worn into surgery.      For patients admitted to the hospital, discharge time will be determined by your treatment team.   Patients discharged the day of surgery will not be allowed to drive home, and someone needs to stay with them for 24 hours.    Special instructions:   [place]- Preparing For  Surgery  Before surgery, you can play an important role. Because skin is not sterile, your skin needs to be as free of germs as possible. You can reduce the number of germs on your skin by washing with CHG (chlorahexidine gluconate) Soap before surgery.  CHG is an antiseptic cleaner which kills germs and bonds with the skin to continue killing germs even after washing.    Oral Hygiene is also important to reduce your risk of infection.  Remember - BRUSH YOUR TEETH THE MORNING OF SURGERY WITH YOUR REGULAR TOOTHPASTE  Please do not use if you have an allergy to CHG or antibacterial soaps. If your skin becomes reddened/irritated stop using the CHG.  Do not shave (including legs and underarms) for at least 48 hours prior to first CHG shower. It is OK to shave your face.  Please follow these instructions carefully.   Shower the NIGHT BEFORE SURGERY and the MORNING OF SURGERY with DIAL Soap.   Pat yourself dry with a CLEAN TOWEL.  Wear CLEAN PAJAMAS to bed the night before surgery  Place CLEAN SHEETS on your bed the night of your first shower and DO NOT SLEEP WITH PETS.   Day of Surgery: Please shower morning  of surgery  Wear Clean/Comfortable clothing the morning of surgery Do not apply any deodorants/lotions.   Remember to brush your teeth WITH YOUR REGULAR TOOTHPASTE.   Questions were answered. Patient verbalized understanding of instructions.

## 2022-01-31 ENCOUNTER — Ambulatory Visit (HOSPITAL_BASED_OUTPATIENT_CLINIC_OR_DEPARTMENT_OTHER): Payer: 59 | Admitting: Anesthesiology

## 2022-01-31 ENCOUNTER — Ambulatory Visit (HOSPITAL_COMMUNITY): Payer: 59 | Admitting: Anesthesiology

## 2022-01-31 ENCOUNTER — Encounter (HOSPITAL_COMMUNITY): Payer: Self-pay | Admitting: General Surgery

## 2022-01-31 ENCOUNTER — Encounter (HOSPITAL_COMMUNITY)
Admission: RE | Disposition: A | Discharge status: Home / Self Care | Payer: Self-pay | Source: Home / Self Care | Attending: General Surgery

## 2022-01-31 ENCOUNTER — Other Ambulatory Visit: Payer: Self-pay

## 2022-01-31 ENCOUNTER — Observation Stay (HOSPITAL_COMMUNITY)
Admission: RE | Admit: 2022-01-31 | Discharge: 2022-02-01 | Disposition: A | Discharge status: Home / Self Care | Payer: 59 | Attending: General Surgery | Admitting: General Surgery

## 2022-01-31 DIAGNOSIS — C50212 Malignant neoplasm of upper-inner quadrant of left female breast: Principal | ICD-10-CM | POA: Insufficient documentation

## 2022-01-31 DIAGNOSIS — Z170 Estrogen receptor positive status [ER+]: Secondary | ICD-10-CM | POA: Insufficient documentation

## 2022-01-31 DIAGNOSIS — C50912 Malignant neoplasm of unspecified site of left female breast: Secondary | ICD-10-CM

## 2022-01-31 DIAGNOSIS — Z9012 Acquired absence of left breast and nipple: Secondary | ICD-10-CM

## 2022-01-31 HISTORY — PX: NIPPLE SPARING MASTECTOMY WITH SENTINEL LYMPH NODE BIOPSY: SHX6826

## 2022-01-31 HISTORY — PX: BREAST RECONSTRUCTION WITH PLACEMENT OF TISSUE EXPANDER AND ALLODERM: SHX6805

## 2022-01-31 HISTORY — DX: COVID-19: U07.1

## 2022-01-31 LAB — CBC
HCT: 40.6 % (ref 36.0–46.0)
Hemoglobin: 13.2 g/dL (ref 12.0–15.0)
MCH: 28.7 pg (ref 26.0–34.0)
MCHC: 32.5 g/dL (ref 30.0–36.0)
MCV: 88.3 fL (ref 80.0–100.0)
Platelets: 233 10*3/uL (ref 150–400)
RBC: 4.6 MIL/uL (ref 3.87–5.11)
RDW: 13.2 % (ref 11.5–15.5)
WBC: 4.1 10*3/uL (ref 4.0–10.5)
nRBC: 0 % (ref 0.0–0.2)

## 2022-01-31 LAB — BASIC METABOLIC PANEL
Anion gap: 8 (ref 5–15)
BUN: 11 mg/dL (ref 6–20)
CO2: 22 mmol/L (ref 22–32)
Calcium: 9 mg/dL (ref 8.9–10.3)
Chloride: 108 mmol/L (ref 98–111)
Creatinine, Ser: 0.92 mg/dL (ref 0.44–1.00)
GFR, Estimated: >60 mL/min (ref >60)
Glucose, Bld: 112 mg/dL — ABNORMAL HIGH (ref 70–99)
Potassium: 3.8 mmol/L (ref 3.5–5.1)
Sodium: 138 mmol/L (ref 135–145)

## 2022-01-31 LAB — POCT PREGNANCY, URINE: Preg Test, Ur: NEGATIVE

## 2022-01-31 SURGERY — NIPPLE SPARING MASTECTOMY WITH SENTINEL LYMPH NODE BIOPSY
Anesthesia: General | Site: Breast | Laterality: Left

## 2022-01-31 MED ORDER — ENSURE PRE-SURGERY PO LIQD: 296.0000 mL | Freq: Once | ORAL | Status: DC

## 2022-01-31 MED ORDER — MIDAZOLAM HCL 2 MG/2ML IJ SOLN: 2.0000 mg | Freq: Once | INTRAMUSCULAR | Status: AC

## 2022-01-31 MED ORDER — LORATADINE 10 MG PO TABS
10.0000 mg | ORAL_TABLET | Freq: Every evening | ORAL | Status: DC
  Filled 2022-01-31: qty 1

## 2022-01-31 MED ORDER — LIDOCAINE 2% (20 MG/ML) 5 ML SYRINGE
INTRAMUSCULAR | Status: DC | PRN
  Administered 2022-01-31: 60 mg via INTRAVENOUS

## 2022-01-31 MED ORDER — PROPOFOL 10 MG/ML IV BOLUS
INTRAVENOUS | Status: AC
  Filled 2022-01-31: qty 20

## 2022-01-31 MED ORDER — FENTANYL CITRATE (PF) 100 MCG/2ML IJ SOLN: 50.0000 ug | Freq: Once | INTRAMUSCULAR | Status: AC

## 2022-01-31 MED ORDER — METHOCARBAMOL 500 MG PO TABS
500.0000 mg | ORAL_TABLET | Freq: Three times a day (TID) | ORAL | Status: DC
  Administered 2022-01-31 – 2022-02-01 (×3): 500 mg via ORAL
  Filled 2022-01-31 (×3): qty 1

## 2022-01-31 MED ORDER — FENTANYL CITRATE (PF) 100 MCG/2ML IJ SOLN
25.0000 ug | INTRAMUSCULAR | Status: DC | PRN
  Administered 2022-01-31: 50 ug via INTRAVENOUS

## 2022-01-31 MED ORDER — DEXAMETHASONE SODIUM PHOSPHATE 4 MG/ML IJ SOLN
INTRAMUSCULAR | Status: DC | PRN
  Administered 2022-01-31: 10 mg via INTRAVENOUS

## 2022-01-31 MED ORDER — SUMATRIPTAN 20 MG/ACT NA SOLN: 20.0000 mg | NASAL | Status: DC | PRN

## 2022-01-31 MED ORDER — ORAL CARE MOUTH RINSE: 15.0000 mL | Freq: Once | OROMUCOSAL | Status: AC

## 2022-01-31 MED ORDER — ACETAMINOPHEN 500 MG PO TABS
1000.0000 mg | ORAL_TABLET | ORAL | Status: AC
  Administered 2022-01-31: 1000 mg via ORAL
  Filled 2022-01-31: qty 2

## 2022-01-31 MED ORDER — SODIUM CHLORIDE 0.9 % IV SOLN: INTRAVENOUS | Status: DC

## 2022-01-31 MED ORDER — ROCURONIUM BROMIDE 10 MG/ML (PF) SYRINGE
PREFILLED_SYRINGE | INTRAVENOUS | Status: DC | PRN
  Administered 2022-01-31: 40 mg via INTRAVENOUS
  Administered 2022-01-31: 60 mg via INTRAVENOUS

## 2022-01-31 MED ORDER — CEFAZOLIN SODIUM-DEXTROSE 1-4 GM/50ML-% IV SOLN
1.0000 g | Freq: Three times a day (TID) | INTRAVENOUS | Status: AC
  Administered 2022-01-31 – 2022-02-01 (×3): 1 g via INTRAVENOUS
  Filled 2022-01-31 (×3): qty 50

## 2022-01-31 MED ORDER — ONDANSETRON HCL 4 MG/2ML IJ SOLN: 4.0000 mg | Freq: Four times a day (QID) | INTRAMUSCULAR | Status: DC | PRN

## 2022-01-31 MED ORDER — FENTANYL CITRATE (PF) 250 MCG/5ML IJ SOLN
INTRAMUSCULAR | Status: AC
  Filled 2022-01-31: qty 5

## 2022-01-31 MED ORDER — MORPHINE SULFATE (PF) 2 MG/ML IV SOLN
1.0000 mg | INTRAVENOUS | Status: DC | PRN
  Administered 2022-01-31 – 2022-02-01 (×4): 1 mg via INTRAVENOUS
  Filled 2022-01-31 (×5): qty 1

## 2022-01-31 MED ORDER — ZOLPIDEM TARTRATE 5 MG PO TABS: 10.0000 mg | ORAL_TABLET | Freq: Every evening | ORAL | Status: DC | PRN

## 2022-01-31 MED ORDER — 0.9 % SODIUM CHLORIDE (POUR BTL) OPTIME
TOPICAL | Status: DC | PRN
  Administered 2022-01-31: 1000 mL

## 2022-01-31 MED ORDER — PHENYLEPHRINE 80 MCG/ML (10ML) SYRINGE FOR IV PUSH (FOR BLOOD PRESSURE SUPPORT)
PREFILLED_SYRINGE | INTRAVENOUS | Status: DC | PRN
  Administered 2022-01-31 (×2): 80 ug via INTRAVENOUS

## 2022-01-31 MED ORDER — ONDANSETRON 4 MG PO TBDP: 4.0000 mg | ORAL_TABLET | Freq: Four times a day (QID) | ORAL | Status: DC | PRN

## 2022-01-31 MED ORDER — CELECOXIB 200 MG PO CAPS
400.0000 mg | ORAL_CAPSULE | ORAL | Status: AC
  Administered 2022-01-31: 400 mg via ORAL
  Filled 2022-01-31: qty 2

## 2022-01-31 MED ORDER — LACTATED RINGERS IV SOLN: INTRAVENOUS | Status: DC

## 2022-01-31 MED ORDER — SIMETHICONE 80 MG PO CHEW: 40.0000 mg | CHEWABLE_TABLET | Freq: Four times a day (QID) | ORAL | Status: DC | PRN

## 2022-01-31 MED ORDER — PROPOFOL 10 MG/ML IV BOLUS
INTRAVENOUS | Status: DC | PRN
  Administered 2022-01-31: 170 mg via INTRAVENOUS

## 2022-01-31 MED ORDER — DEXAMETHASONE SODIUM PHOSPHATE 10 MG/ML IJ SOLN
INTRAMUSCULAR | Status: AC
  Filled 2022-01-31: qty 1

## 2022-01-31 MED ORDER — ROCURONIUM BROMIDE 10 MG/ML (PF) SYRINGE
PREFILLED_SYRINGE | INTRAVENOUS | Status: AC
  Filled 2022-01-31: qty 10

## 2022-01-31 MED ORDER — SODIUM CHLORIDE 0.9 % IV SOLN
Freq: Once | INTRAVENOUS | Status: AC
  Filled 2022-01-31: qty 10

## 2022-01-31 MED ORDER — SUGAMMADEX SODIUM 200 MG/2ML IV SOLN
INTRAVENOUS | Status: DC | PRN
  Administered 2022-01-31: 200 mg via INTRAVENOUS

## 2022-01-31 MED ORDER — FLUTICASONE PROPIONATE 50 MCG/ACT NA SUSP
2.0000 | Freq: Every day | NASAL | Status: DC
  Administered 2022-01-31 – 2022-02-01 (×2): 2 via NASAL
  Filled 2022-01-31: qty 16

## 2022-01-31 MED ORDER — ONDANSETRON HCL 4 MG/2ML IJ SOLN
INTRAMUSCULAR | Status: DC | PRN
  Administered 2022-01-31: 4 mg via INTRAVENOUS

## 2022-01-31 MED ORDER — FENTANYL CITRATE (PF) 100 MCG/2ML IJ SOLN
INTRAMUSCULAR | Status: AC
  Administered 2022-01-31: 50 ug via INTRAVENOUS
  Filled 2022-01-31: qty 2

## 2022-01-31 MED ORDER — PROMETHAZINE HCL 25 MG/ML IJ SOLN: 6.2500 mg | INTRAMUSCULAR | Status: DC | PRN

## 2022-01-31 MED ORDER — ONDANSETRON HCL 4 MG/2ML IJ SOLN
INTRAMUSCULAR | Status: AC
  Filled 2022-01-31: qty 2

## 2022-01-31 MED ORDER — ALPRAZOLAM 0.25 MG PO TABS: 0.2500 mg | ORAL_TABLET | Freq: Three times a day (TID) | ORAL | Status: DC | PRN

## 2022-01-31 MED ORDER — MIDAZOLAM HCL 2 MG/2ML IJ SOLN
INTRAMUSCULAR | Status: AC
  Administered 2022-01-31: 2 mg via INTRAVENOUS
  Filled 2022-01-31: qty 2

## 2022-01-31 MED ORDER — FENTANYL CITRATE (PF) 100 MCG/2ML IJ SOLN
INTRAMUSCULAR | Status: AC
  Filled 2022-01-31: qty 2

## 2022-01-31 MED ORDER — LIDOCAINE 2% (20 MG/ML) 5 ML SYRINGE
INTRAMUSCULAR | Status: AC
  Filled 2022-01-31: qty 5

## 2022-01-31 MED ORDER — PHENYLEPHRINE 80 MCG/ML (10ML) SYRINGE FOR IV PUSH (FOR BLOOD PRESSURE SUPPORT)
PREFILLED_SYRINGE | INTRAVENOUS | Status: AC
  Filled 2022-01-31: qty 20

## 2022-01-31 MED ORDER — TRAMADOL HCL 50 MG PO TABS
50.0000 mg | ORAL_TABLET | Freq: Four times a day (QID) | ORAL | Status: DC | PRN
  Administered 2022-02-01: 50 mg via ORAL
  Filled 2022-01-31: qty 1

## 2022-01-31 MED ORDER — FENTANYL CITRATE (PF) 100 MCG/2ML IJ SOLN
INTRAMUSCULAR | Status: DC | PRN
  Administered 2022-01-31 (×2): 50 ug via INTRAVENOUS
  Administered 2022-01-31: 25 ug via INTRAVENOUS

## 2022-01-31 MED ORDER — CEFAZOLIN SODIUM-DEXTROSE 2-4 GM/100ML-% IV SOLN
2.0000 g | INTRAVENOUS | Status: AC
  Administered 2022-01-31: 2 g via INTRAVENOUS
  Filled 2022-01-31: qty 100

## 2022-01-31 MED ORDER — ACETAMINOPHEN 500 MG PO TABS
1000.0000 mg | ORAL_TABLET | Freq: Four times a day (QID) | ORAL | Status: DC
  Administered 2022-01-31 – 2022-02-01 (×4): 1000 mg via ORAL
  Filled 2022-01-31 (×3): qty 2

## 2022-01-31 MED ORDER — BUPIVACAINE-EPINEPHRINE (PF) 0.5% -1:200000 IJ SOLN
INTRAMUSCULAR | Status: DC | PRN
  Administered 2022-01-31: 30 mL via PERINEURAL

## 2022-01-31 MED ORDER — CHLORHEXIDINE GLUCONATE CLOTH 2 % EX PADS: 6.0000 | MEDICATED_PAD | Freq: Once | CUTANEOUS | Status: DC

## 2022-01-31 MED ORDER — OXYBUTYNIN CHLORIDE ER 10 MG PO TB24
10.0000 mg | ORAL_TABLET | Freq: Every day | ORAL | Status: DC
  Administered 2022-01-31: 10 mg via ORAL
  Filled 2022-01-31 (×2): qty 1

## 2022-01-31 MED ORDER — SCOPOLAMINE 1 MG/3DAYS TD PT72
MEDICATED_PATCH | TRANSDERMAL | Status: AC
  Filled 2022-01-31: qty 1

## 2022-01-31 MED ORDER — ENOXAPARIN SODIUM 40 MG/0.4ML IJ SOSY
40.0000 mg | PREFILLED_SYRINGE | INTRAMUSCULAR | Status: DC
  Administered 2022-02-01: 40 mg via SUBCUTANEOUS
  Filled 2022-01-31: qty 0.4

## 2022-01-31 MED ORDER — CHLORHEXIDINE GLUCONATE 0.12 % MT SOLN
15.0000 mL | Freq: Once | OROMUCOSAL | Status: AC
  Administered 2022-01-31: 15 mL via OROMUCOSAL
  Filled 2022-01-31: qty 15

## 2022-01-31 SURGICAL SUPPLY — 63 items
ALLOGRAFT PERF 16X20 1.6+/-0.4 (Tissue) IMPLANT
BAG COUNTER SPONGE SURGICOUNT (BAG) ×1 IMPLANT
BAG DECANTER FOR FLEXI CONT (MISCELLANEOUS) ×1 IMPLANT
BINDER BREAST LRG (GAUZE/BANDAGES/DRESSINGS) IMPLANT
BINDER BREAST XLRG (GAUZE/BANDAGES/DRESSINGS) IMPLANT
BLADE SURG 10 STRL SS (BLADE) IMPLANT
BNDG GAUZE DERMACEA FLUFF 4 (GAUZE/BANDAGES/DRESSINGS) ×2 IMPLANT
CANISTER SUCT 3000ML PPV (MISCELLANEOUS) ×1 IMPLANT
CHLORAPREP W/TINT 26 (MISCELLANEOUS) ×1 IMPLANT
COVER MAYO STAND STRL (DRAPES) IMPLANT
COVER SURGICAL LIGHT HANDLE (MISCELLANEOUS) ×1 IMPLANT
DERMABOND ADVANCED .7 DNX12 (GAUZE/BANDAGES/DRESSINGS) ×2 IMPLANT
DRAIN CHANNEL 15F RND FF W/TCR (WOUND CARE) IMPLANT
DRAIN CHANNEL 19F RND (DRAIN) ×2 IMPLANT
DRAPE HALF SHEET 40X57 (DRAPES) IMPLANT
DRAPE INCISE IOBAN 66X45 STRL (DRAPES) ×1 IMPLANT
DRAPE TOP ARMCOVERS (MISCELLANEOUS) ×1 IMPLANT
DRAPE U-SHAPE 76X120 STRL (DRAPES) ×1 IMPLANT
DRAPE UTILITY XL STRL (DRAPES) ×1 IMPLANT
DRAPE WARM FLUID 44X44 (DRAPES) ×1 IMPLANT
DRSG TEGADERM 4X10 (GAUZE/BANDAGES/DRESSINGS) ×2 IMPLANT
DRSG TEGADERM 4X4.75 (GAUZE/BANDAGES/DRESSINGS) ×4 IMPLANT
ELECT BLADE 4.0 EZ CLEAN MEGAD (MISCELLANEOUS) ×1
ELECT CAUTERY BLADE 6.4 (BLADE) ×1 IMPLANT
ELECT COATED BLADE 2.86 ST (ELECTRODE) ×1 IMPLANT
ELECT REM PT RETURN 9FT ADLT (ELECTROSURGICAL) ×1
ELECTRODE BLDE 4.0 EZ CLN MEGD (MISCELLANEOUS) ×1 IMPLANT
ELECTRODE REM PT RTRN 9FT ADLT (ELECTROSURGICAL) ×1 IMPLANT
EVACUATOR SILICONE 100CC (DRAIN) IMPLANT
EXPANDER TISSUE FORTE 300CC (Breast) IMPLANT
GAUZE PAD ABD 8X10 STRL (GAUZE/BANDAGES/DRESSINGS) ×2 IMPLANT
GLOVE BIO SURGEON STRL SZ 6 (GLOVE) ×3 IMPLANT
GOWN STRL REUS W/ TWL LRG LVL3 (GOWN DISPOSABLE) ×2 IMPLANT
GOWN STRL REUS W/TWL LRG LVL3 (GOWN DISPOSABLE) ×2
KIT BASIN OR (CUSTOM PROCEDURE TRAY) ×1 IMPLANT
KIT TURNOVER KIT B (KITS) ×1 IMPLANT
MARKER SKIN DUAL TIP RULER LAB (MISCELLANEOUS) ×1 IMPLANT
NS IRRIG 1000ML POUR BTL (IV SOLUTION) ×2 IMPLANT
PACK GENERAL/GYN (CUSTOM PROCEDURE TRAY) ×1 IMPLANT
PAD ARMBOARD 7.5X6 YLW CONV (MISCELLANEOUS) ×1 IMPLANT
PIN SAFETY STERILE (MISCELLANEOUS) ×1 IMPLANT
SET ASEPTIC TRANSFER (MISCELLANEOUS) ×1 IMPLANT
SOL PREP POV-IOD 4OZ 10% (MISCELLANEOUS) ×1 IMPLANT
SPONGE T-LAP 18X18 ~~LOC~~+RFID (SPONGE) IMPLANT
STAPLER VISISTAT 35W (STAPLE) ×1 IMPLANT
SUT CHROMIC 4 0 PS 2 18 (SUTURE) IMPLANT
SUT ETHILON 2 0 FS 18 (SUTURE) ×1 IMPLANT
SUT MNCRL AB 4-0 PS2 18 (SUTURE) ×1 IMPLANT
SUT VIC AB 0 CT2 27 (SUTURE) IMPLANT
SUT VIC AB 3-0 SH 27 (SUTURE) ×1
SUT VIC AB 3-0 SH 27X BRD (SUTURE) IMPLANT
SUT VIC AB 4-0 PS2 18 (SUTURE) IMPLANT
SUT VICRYL 4-0 PS2 18IN ABS (SUTURE) IMPLANT
SUT VLOC 180 0 24IN GS25 (SUTURE) IMPLANT
SYR 50ML LL SCALE MARK (SYRINGE) IMPLANT
SYR BULB IRRIG 60ML STRL (SYRINGE) ×1 IMPLANT
TISSUE EXPANDER FORTE 300CC (Breast) ×1 IMPLANT
TOWEL GREEN STERILE (TOWEL DISPOSABLE) ×1 IMPLANT
TOWEL GREEN STERILE FF (TOWEL DISPOSABLE) ×1 IMPLANT
TRAY FOLEY MTR SLVR 16FR STAT (SET/KITS/TRAYS/PACK) IMPLANT
TRAY FOLEY W/BAG SLVR 16FR (SET/KITS/TRAYS/PACK)
TRAY FOLEY W/BAG SLVR 16FR ST (SET/KITS/TRAYS/PACK) IMPLANT
UNDERPAD 30X36 HEAVY ABSORB (UNDERPADS AND DIAPERS) ×2 IMPLANT

## 2022-01-31 NOTE — Anesthesia Preprocedure Evaluation (Addendum)
Anesthesia Evaluation  Patient identified by MRN, date of birth, ID band Patient awake    Reviewed: Allergy & Precautions, NPO status , Patient's Chart, lab work & pertinent test results  Airway Mallampati: II  TM Distance: >3 FB Neck ROM: Full    Dental  (+) Teeth Intact, Dental Advisory Given   Pulmonary neg pulmonary ROS,    Pulmonary exam normal breath sounds clear to auscultation       Cardiovascular negative cardio ROS Normal cardiovascular exam Rhythm:Regular Rate:Normal     Neuro/Psych  Headaches, PSYCHIATRIC DISORDERS Anxiety    GI/Hepatic negative GI ROS, Neg liver ROS,   Endo/Other  negative endocrine ROS  Renal/GU negative Renal ROS     Musculoskeletal negative musculoskeletal ROS (+)   Abdominal   Peds  Hematology negative hematology ROS (+)   Anesthesia Other Findings Day of surgery medications reviewed with the patient.  LEFT BREAST CANCER  Reproductive/Obstetrics                             Anesthesia Physical Anesthesia Plan  ASA: 2  Anesthesia Plan: General   Post-op Pain Management: Tylenol PO (pre-op)* and Regional block*   Induction: Intravenous  PONV Risk Score and Plan: 3 and Midazolam, Dexamethasone and Ondansetron  Airway Management Planned: Oral ETT  Additional Equipment:   Intra-op Plan:   Post-operative Plan: Extubation in OR  Informed Consent: I have reviewed the patients History and Physical, chart, labs and discussed the procedure including the risks, benefits and alternatives for the proposed anesthesia with the patient or authorized representative who has indicated his/her understanding and acceptance.     Dental advisory given  Plan Discussed with: CRNA  Anesthesia Plan Comments:         Anesthesia Quick Evaluation

## 2022-01-31 NOTE — H&P (Signed)
56 year old female who I know from a prior appendectomy. She has a family history of breast cancer in her mom at around age 61 and a sister around age 93. She actually has negative genetic testing that I am trying to obtain a copy of from a couple of years ago. She has no prior breast history. She had a screening mammogram as she did not have a mass or discharge. She has C density breast tissue. On her mammogram she was found to have a central upper breast mass on the left side about 2 cm. Her left axillary ultrasound was negative. She underwent a biopsy that was ultrasound-guided. The clip did not sit in the area of the distortion so the distortion has not been fully evaluated. The mass showed a grade 2 invasive ductal carcinoma that is 95% ER positive, 100% PR positive, HER2 negative, and Ki-67 is 10%. Since then she had an mri that showed a 4.7x3.4x3.1 cm area with single node with some CT. [place] previously normal. She has had mr clip placement and biopsy of the extent of this that does show IDC.  She is on tamoxifen due to delay of all imaging/biopsies. Tolerating fine.   Medical History: Past Medical History:  Diagnosis Date  Anxiety  History of cancer   There is no problem list on file for this patient.  Past Surgical History:  Procedure Laterality Date  APPENDECTOMY   No Known Allergies  Current Outpatient Medications on File Prior to Visit  Medication Sig Dispense Refill  fluticasone propionate (FLONASE) 50 mcg/actuation nasal spray SPRAY 1 SPRAY INTO EACH NOSTRIL EVERY DAY FOR 90 DAYS  levocetirizine (XYZAL) 5 MG tablet Take 1 tablet by mouth every evening  montelukast (SINGULAIR) 10 mg tablet Take 1 tablet by mouth once daily  oxyBUTYnin (DITROPAN-XL) 10 MG XL tablet Take 1 tablet by mouth once daily  SUMAtriptan (IMITREX) 20 mg/actuation nasal spray USE 1 SPRAY INTO EACH NOSTRIL EVERY 2 HOURS AS NEEDED (MAX TWICE PER DAY)  tamoxifen (NOLVADEX) 10 MG tablet Take 1 tablet by mouth once  daily  zolpidem (AMBIEN) 10 mg tablet TAKE 1 TABLET BY MOUTH EVERY DAY AT BEDTIME AS NEEDED FOR INSOMNIA   No current facility-administered medications on file prior to visit.   Family History  Problem Relation Age of Onset  Breast cancer Mother  High blood pressure (Hypertension) Father  Diabetes Father  High blood pressure (Hypertension) Sister  Breast cancer Sister  Hyperlipidemia (Elevated cholesterol) Brother    Social History   Tobacco Use  Smoking Status Never  Smokeless Tobacco Never  Marital status: Married  Tobacco Use  Smoking status: Never  Smokeless tobacco: Never  Substance and Sexual Activity  Alcohol use: Not Currently  Drug use: Not Currently   Objective:   Physical Exam  Constitutional:  Appearance: Normal appearance.  Cardiovascular:  Rate and Rhythm: Normal rate.  Pulmonary:  Effort: Pulmonary effort is normal.  Chest:  Breasts: Right: No inverted nipple, mass or nipple discharge.  Left: No inverted nipple, mass or nipple discharge.  Comments: Dense tissue upper central to uoq left breast with some hematoma, no discreet mass Lymphadenopathy:  Upper Body:  Right upper body: No supraclavicular or axillary adenopathy.  Left upper body: No supraclavicular or axillary adenopathy.  Neurological:  Mental Status: She is alert.   Assessment and Plan:   Left nipple sparing mastectomy with left axillary sentinel lymph node biopsy  We discussed her recent biopsy and I showed she and her husband all of her  films today. I think we could try to do a lumpectomy but this would make this breast significantly smaller and certainly would lift it. She could have a symmetry procedure. She would then need to have radiotherapy. She after this discussion would like to proceed with mastectomy. I think she is a candidate for nipple sparing mastectomy. We will also combine this with a sentinel node biopsy obviously. I am going to have her see plastic surgery and then we  will plan to coordinate. We discussed risks of nipple sparing mastectomy including loss of sensation, nipple biopsy being positive necessitating resection as well as skin partial or full-thickness loss. We discussed that mastectomies are not 100% preventive and there is some small chance of having cancer postoperatively. We also discussed that I do not think she needs a double mastectomy for this at all either but may require a symmetry procedure in the future. I also told her that we will stop her tamoxifen 7 days prior to surgery once we schedule her.

## 2022-01-31 NOTE — Anesthesia Procedure Notes (Signed)
Anesthesia Regional Block: Pectoralis block   Pre-Anesthetic Checklist: , timeout performed,  Correct Patient, Correct Site, Correct Laterality,  Correct Procedure, Correct Position, site marked,  Risks and benefits discussed,  Surgical consent,  Pre-op evaluation,  At surgeon's request and post-op pain management  Laterality: Left  Prep: chloraprep       Needles:  Injection technique: Single-shot  Needle Type: Echogenic Needle     Needle Length: 9cm  Needle Gauge: 21     Additional Needles:   Procedures:,,,, ultrasound used (permanent image in chart),,    Narrative:  Start time: 01/31/2022 8:15 AM End time: 01/31/2022 8:21 AM Injection made incrementally with aspirations every 5 mL.  Performed by: Personally  Anesthesiologist: Santa Lighter, MD  Additional Notes: No pain on injection. No increased resistance to injection. Injection made in 5cc increments.  Good needle visualization.  Patient tolerated procedure well.

## 2022-01-31 NOTE — Anesthesia Procedure Notes (Signed)
Procedure Name: Intubation Date/Time: 01/31/2022 9:20 AM  Performed by: Lieutenant Diego, CRNAPre-anesthesia Checklist: Patient identified, Emergency Drugs available, Suction available and Patient being monitored Patient Re-evaluated:Patient Re-evaluated prior to induction Oxygen Delivery Method: Circle system utilized Preoxygenation: Pre-oxygenation with 100% oxygen Induction Type: IV induction Ventilation: Mask ventilation without difficulty Laryngoscope Size: 2 and Miller Grade View: Grade II Tube type: Oral Tube size: 7.0 mm Number of attempts: 1 Airway Equipment and Method: Stylet and Oral airway Placement Confirmation: ETT inserted through vocal cords under direct vision, positive ETCO2 and breath sounds checked- equal and bilateral Secured at: 21 cm Tube secured with: Tape Dental Injury: Teeth and Oropharynx as per pre-operative assessment

## 2022-01-31 NOTE — Op Note (Signed)
Operative Note   DATE OF OPERATION: 10.3.23  LOCATION: [facility] Main OR-observation  SURGICAL DIVISION: Plastic Surgery  PREOPERATIVE DIAGNOSES:  Left breast cancer UIQ ER+  POSTOPERATIVE DIAGNOSES:  same  PROCEDURE:  1. Left breast reconstruction with tissue expander 2. Acellular dermis (Alloderm) to left chest 300 cm2  SURGEON: Irene Limbo MD MBA  ASSISTANTCora Collum RNFA  ANESTHESIA:  General.   EBL: 75 ml for entire procedure  COMPLICATIONS: None immediate.   INDICATIONS FOR PROCEDURE:  The patient, Samantha Pena, is a 56 y.o. female born on [birth year], is here for immediate prepectoral tissue expander reconstruction following nipple sparing mastectomy.   FINDINGS: Natrelle 133S FV 11 T 300 ml tissue expander placed, initial fill volume 150 ml air. SN [id number]  DESCRIPTION OF PROCEDURE:  The patient was marked to mark sternal notch, chest midline, anterior axillary lines and inframammary folds. The patient was taken to the operating room. SCDs were placed and IV antibiotics were given. The patient's operative site was prepped and draped in a sterile fashion. A time out was performed and all information was confirmed to be correct. I assisted in mastectomy with retraction and exposure.    Following completion of mastectomy, the cavity was irrigated with saline solution containing Ancef, gentamicin, and Betadine. Hemostasis was ensured. A 19 Fr drain was placed in subcutaneous position laterally and a 15 Fr drain placed along inframammary fold. Each secured to skin with 2-0 nylon. The tissue expander was prepared on back table prior to insertion. The expander was filled with air. Perforated acellular dermis was  draped over anterior surface expander. The ADM was then secured to itself over posterior surface of expander with 4-0 chromic. Redundant folds acellular dermis excised so that the ADM lay flat without folds over air filled expander. The expander was secured to medial  insertion pectoralis with a 0 vicryl. The lateral tab also secured to pectoralis muscle with 0-vicryl. The ADM was secured to pectoralis muscle and chest wall along inferior border at inframammary fold with 0 V lock. Laterally the mastectomy flap over posterior axillary line was advanced anteriorly and the subcutaneous tissue and superficial fascia was secured to chest wall with 0-vicryl. Skin closure completed with 3-0 vicryl in fascial layer and 4-0 vicryl in dermis. Skin closure completed with 4-0 monocryl subcuticular and tissue adhesive. Patient brought to upright sitting position. The mastectomy flap was redraped with nipple position symmetric with right breast. Patient returned to supine position. Tegaderms applied bilateral, followed by dry dressing and breast binder.    The patient was allowed to wake from anesthesia, extubated and taken to the recovery room in satisfactory condition.   SPECIMENS: none  DRAINS: 15 and 19 Fr JP in left subcutaneous chest  Irene Limbo, MD [facility]astic [facility]  Office/ physician access line after hours [phone]

## 2022-01-31 NOTE — Interval H&P Note (Signed)
History and Physical Interval Note:  01/31/2022 8:51 AM  Samantha Pena  has presented today for surgery, with the diagnosis of LEFT BREAST CANCER.  The various methods of treatment have been discussed with the patient and family. After consideration of risks, benefits and other options for treatment, the patient has consented to  Procedure(s): LEFT NIPPLE [place] (Left) BREAST RECONSTRUCTION WITH PLACEMENT OF TISSUE EXPANDER AND ALLODERM (Left) as a surgical intervention.  The patient's history has been reviewed, patient examined, no change in status, stable for surgery.  I have reviewed the patient's chart and labs.  Questions were answered to the patient's satisfaction.     Rolm Bookbinder

## 2022-01-31 NOTE — Op Note (Signed)
Preoperative diagnosis: Clinical stage II left breast cancer Postoperative diagnosis: Same as above Procedure: 1.  Left nipple sparing mastectomy 2.  Left deep axillary sentinel lymph node biopsy 3.  Injection of mag trace for sentinel lymph node identification Surgeon: Dr. Serita Grammes Anesthesia: General with a pectoral block Estimated blood loss: 100 cc Specimens: 1.  Left breast tissue marked short superior, long lateral, double marks nipple areola 2.  Left axillary sentinel lymph nodes with highest count of [id number] Complications: None Drains: Per plastic surgery Disposition Case turned over to plastic surgery for reconstruction  Indications: 56 year old female who has C density breast tissue. On her mammogram she was found to have a central upper breast mass on the left side about 2 cm. Her left axillary ultrasound was negative. She underwent a biopsy that was ultrasound-guided. The clip did not sit in the area of the distortion so the distortion has not been fully evaluated. The mass showed a grade 2 invasive ductal carcinoma that is 95% ER positive, 100% PR positive, HER2 negative, and Ki-67 is 10%. Since then she had an mri that showed a 4.7x3.4x3.1 cm area with single node with some CT. [place] previously normal. She has had mr clip placement and biopsy of the extent of this that does show IDC. We discussed options and elected to proceed with nsm and sn biopsy combined with expander reconstruction.  Procedure: After informed consent was obtained I first injected mag trace in the subareolar position.  This was 2 cc of mag trace.  She then underwent a pectoral block.  She was given antibiotics.  SCDs were placed.  She was placed then under general tracheal anesthesia without complication.  She was prepped and draped in standard sterile surgical fashion.  Surgical timeout was then performed.  I made a inframammary incision that measured 10 cm starting 8 cm from the xiphoid.  I did the posterior  flap first.  Using cautery I remove the fascia and the breast tissue from the pectoralis muscle to the margins of the breast tissue.  I then created the anterior flap and remove the breast tissue from the skin using a combination of cautery and facelift scissors.  The breast tissue was then removed and marked as above.  Hemostasis obtained.  I then remove the entirety of the nipple using a 15 blade.  There was no hole made during this.  That was able to identify the sentinel node through my inframammary incision.  I used the center mag probe and removed what appeared to be 2 or 3 normal-appearing lymph nodes.  There was no background activity and no other palpable nodes that were noted in the axilla.  I then obtained hemostasis and turned the case over to plastic surgery for reconstruction.

## 2022-01-31 NOTE — Transfer of Care (Signed)
Immediate Anesthesia Transfer of Care Note  Patient: Samantha Pena  Procedure(s) Performed: LEFT NIPPLE SPARING MASTECTOMY WITH AXILLARY SENTINEL LYMPH NODE BIOPSY (Left: Breast) BREAST RECONSTRUCTION WITH PLACEMENT OF TISSUE EXPANDER AND ALLODERM (Left: Breast)  Patient Location: PACU  Anesthesia Type:General  Level of Consciousness: awake  Airway & Oxygen Therapy: Patient Spontanous Breathing and Patient connected to nasal cannula oxygen  Post-op Assessment: Report given to RN and Post -op Vital signs reviewed and stable  Post vital signs: Reviewed and stable  Last Vitals:  Vitals Value Taken Time  BP 135/85 01/31/22 1130  Temp    Pulse 76 01/31/22 1131  Resp 12 01/31/22 1131  SpO2 99 % 01/31/22 1131  Vitals shown include unvalidated device data.  Last Pain:  Vitals:   01/31/22 0830  TempSrc:   PainSc: 0-No pain         Complications: No notable events documented.

## 2022-01-31 NOTE — Interval H&P Note (Signed)
History and Physical Interval Note:  01/31/2022 7:22 AM  Samantha Pena  has presented today for surgery, with the diagnosis of LEFT BREAST CANCER.  The various methods of treatment have been discussed with the patient and family. After consideration of risks, benefits and other options for treatment, the patient has consented to  Procedure(s): LEFT NIPPLE [place] (Left) BREAST RECONSTRUCTION WITH PLACEMENT OF TISSUE EXPANDER AND ALLODERM (Left) as a surgical intervention.  The patient's history has been reviewed, patient examined, no change in status, stable for surgery.  I have reviewed the patient's chart and labs.  Questions were answered to the patient's satisfaction.     Arnoldo Hooker Bekka Qian

## 2022-02-01 ENCOUNTER — Encounter (HOSPITAL_COMMUNITY): Payer: Self-pay | Admitting: General Surgery

## 2022-02-01 DIAGNOSIS — C50212 Malignant neoplasm of upper-inner quadrant of left female breast: Secondary | ICD-10-CM | POA: Diagnosis not present

## 2022-02-01 NOTE — Discharge Summary (Signed)
Physician Discharge Summary  Patient ID: Samantha Pena MRN: [id number] DOB/AGE: 08/07/1965 56 y.o.  Admit date: 01/31/2022 Discharge date: 02/01/2022  Admission Diagnoses: Left breast cancer  Discharge Diagnoses:  Principal Problem:   S/P left mastectomy   Discharged Condition: good  Hospital Course: 56 yof s/p left nsm with ax sn biopsy and expander reconstruction.  Doing well following am and will be discharged  Consults: None  Significant Diagnostic Studies: none  Treatments: surgery: left nsm with left ax sn biopsy, expander  Discharge Exam: Blood pressure 102/66, pulse 62, temperature 98.2 F (36.8 C), temperature source Oral, resp. rate 18, height '5\' 6"'$  (1.676 m), weight 70.8 kg, last menstrual period 01/26/2022, SpO2 100 %. Skin viable, no hematoma, drains as expected  Disposition: Discharge disposition: 01-Home or Self Care       Discharge Instructions     Call MD for:  redness, tenderness, or signs of infection (pain, swelling, bleeding, redness, odor or green/yellow discharge around incision site)   Complete by: As directed    Call MD for:  temperature >100.5   Complete by: As directed    Discharge instructions   Complete by: As directed    Ok to remove dressings and shower am 10.5.23. Soap and water ok, pat Tegaderms dry. Do not remove Tegaderms. No creams or ointments over incisions. Do not let drains dangle in shower, attach to lanyard or similar.Strip and record drains twice daily and bring log to clinic visit.  Breast binder or soft compression bra all other times.  Ok to raise arms above shoulders for bathing and dressing.  No house yard work or exercise until cleared by MD.    Patient received all Rx preop. Recommend ibuprofen with meals to aid with pain control. Also ok to use Tylenol for pain. Recommend Miralax or Dulcolax as needed for constipation.   Driving Restrictions   Complete by: As directed    No driving for 2 weeks then no driving if  taking prescription pain medication   Lifting restrictions   Complete by: As directed    No lifting > 5-10 lbs until cleared by MD   Resume previous diet   Complete by: As directed       Allergies as of 02/01/2022   No Known Allergies      Medication List     STOP taking these medications    tamoxifen 10 MG tablet Commonly known as: NOLVADEX       TAKE these medications    ALPRAZolam 0.25 MG tablet Commonly known as: XANAX Take 0.25 mg by mouth every 8 (eight) hours as needed for anxiety.   CALCIUM 600 + D PO Take 1 tablet by mouth daily.   fluticasone 50 MCG/ACT nasal spray Commonly known as: FLONASE Place 2 sprays into both nostrils daily.   ibuprofen 200 MG tablet Commonly known as: ADVIL Take 400 mg by mouth every 6 (six) hours as needed for moderate pain.   levocetirizine 5 MG tablet Commonly known as: XYZAL Take 5 mg by mouth every evening.   MULTI FOR HER PO Take 1 tablet by mouth daily.   oxybutynin 10 MG 24 hr tablet Commonly known as: DITROPAN-XL Take 10 mg by mouth at bedtime.   SUMAtriptan 20 MG/ACT nasal spray Commonly known as: IMITREX USE 1 SPRAY INTO EACH NOSTRIL EVERY 2 HOURS AS NEEDED (max twice per day)   tretinoin 0.025 % cream Commonly known as: RETIN-A Apply 1 application  topically daily as needed (acne).  vitamin C 1000 MG tablet Take 1,000 mg by mouth daily as needed (immune support).   zinc gluconate 50 MG tablet Take 50 mg by mouth daily.   zolpidem 10 MG tablet Commonly known as: AMBIEN Take 10 mg by mouth at bedtime.        Follow-up Information     Irene Limbo, MD Follow up in 1 week(s).   Specialty: Plastic Surgery Why: as scheduled Contact information: [place] [address] [phone]         Rolm Bookbinder, MD. Schedule an appointment as soon as possible for a visit in 2 week(s).   Specialty: General Surgery Contact information: [address] [place] [address] [phone]                 Signed: Rolm Bookbinder 02/01/2022, 7:29 AM

## 2022-02-01 NOTE — Plan of Care (Signed)
  Problem: Education: Goal: Knowledge of General Education information will improve Description: Including pain rating scale, medication(s)/side effects and non-pharmacologic comfort measures Outcome: Completed/Met   Problem: Health Behavior/Discharge Planning: Goal: Ability to manage health-related needs will improve Outcome: Completed/Met   Problem: Clinical Measurements: Goal: Ability to maintain clinical measurements within normal limits will improve Outcome: Completed/Met Goal: Will remain free from infection Outcome: Completed/Met Goal: Diagnostic test results will improve Outcome: Completed/Met Goal: Respiratory complications will improve Outcome: Completed/Met Goal: Cardiovascular complication will be avoided Outcome: Completed/Met   Problem: Activity: Goal: Risk for activity intolerance will decrease Outcome: Completed/Met   Problem: Nutrition: Goal: Adequate nutrition will be maintained Outcome: Completed/Met   Problem: Coping: Goal: Level of anxiety will decrease Outcome: Completed/Met   Problem: Elimination: Goal: Will not experience complications related to bowel motility Outcome: Completed/Met Goal: Will not experience complications related to urinary retention Outcome: Completed/Met   Problem: Pain Managment: Goal: General experience of comfort will improve Outcome: Completed/Met   Problem: Safety: Goal: Ability to remain free from injury will improve Outcome: Completed/Met   Problem: Skin Integrity: Goal: Risk for impaired skin integrity will decrease Outcome: Completed/Met   Problem: Education: Goal: Knowledge of disease or condition will improve Outcome: Completed/Met   Problem: Activity: Goal: Ability to maintain or regain function will improve Outcome: Completed/Met   Problem: Clinical Measurements: Goal: Postoperative complications will be avoided or minimized Outcome: Completed/Met   Problem: Self-Concept: Goal: Ability to  verbalize positive feelings about self will improve Outcome: Completed/Met   Problem: Pain Management: Goal: Expressions of feelings of enhanced comfort will increase Outcome: Completed/Met   Problem: Skin Integrity: Goal: Demonstration of wound healing without infection will improve Outcome: Completed/Met

## 2022-02-01 NOTE — Discharge Instructions (Signed)
[facility] [phone]  MASTECTOMY: POST OP INSTRUCTIONS Take 400 mg of ibuprofen every 8 hours or 650 mg tylenol every 6 hours for next 72 hours then as needed. Use ice several times daily also. Always review your discharge instruction sheet given to you by the facility where your surgery was performed.  A prescription for pain medication may be given to you upon discharge.  Take your pain medication as prescribed, if needed.  If narcotic pain medicine is not needed, then you may take acetaminophen (Tylenol), naprosyn (Alleve) or ibuprofen (Advil) as needed. Take your usually prescribed medications unless otherwise directed. If you need a refill on your pain medication, please contact your pharmacy.  They will contact our office to request authorization.  Prescriptions will not be filled after 5pm or on week-ends. You should follow a light diet the first 24 hours after surgery.  Resume your normal diet the day after surgery. Most patients will experience some swelling and bruising on the chest and underarm.  Ice packs will help.  Swelling and bruising can take several days to resolve. Wear the binder or [place] bra for 72 hours day and night. After night please wear during the day.  It is common to experience some constipation if taking pain medication after surgery.  Increasing fluid intake and taking a stool softener (such as Colace) will usually help or prevent this problem from occurring.  A mild laxative (Milk of Magnesia or Miralax) should be taken according to package instructions if there are no bowel movements after 48 hours. There is glue and steristrips on your incision. They will come off in the next few weeks.  You may take a shower 48 hours after surgery.  Any sutures will be removed at an office visit DRAINS:  If you have drains in place, it is important to keep a list of the amount of drainage produced each day in your drains.  Before leaving the hospital, you  should be instructed on drain care.  Call our office if you have any questions about your drains. I will remove your drains when they put out less than 30 cc or ml for 2 consecutive days. ACTIVITIES:  You may resume regular (light) daily activities beginning the next day--such as daily self-care, walking, climbing stairs--gradually increasing activities as tolerated.  You may have sexual intercourse when it is comfortable.  Refrain from any heavy lifting or straining until approved by your doctor. You may drive when you are no longer taking prescription pain medication, you can comfortably wear a seatbelt, and you can safely maneuver your car and apply brakes. RETURN TO WORK:  __________________________________________________________ You should see your doctor in the office for a follow-up appointment approximately 3-5 days after your surgery.  Your doctor's nurse will typically make your follow-up appointment when she calls you with your pathology report.  Expect your pathology report 3-4business days after surgery. OTHER INSTRUCTIONS: ______________________________________________________________________________________________ ____________________________________________________________________________________________ WHEN TO CALL YOUR DR Lilyanne Mcquown: Fever over 101.0 Nausea and/or vomiting Extreme swelling or bruising Continued bleeding from incision. Increased pain, redness, or drainage from the incision. The clinic staff is available to answer your questions during regular business hours.  Please don't hesitate to call and ask to speak to one of the nurses for clinical concerns.  If you have a medical emergency, go to the nearest emergency room or call 911.  A surgeon from [facility] is always on call at the hospital. [address], [address], [place], [place]    [address] ? [address] [address], [place], [place]   [address] [phone] ? [phone] ? FAX [phone] Web site:  [email or web address]    About my Jackson-Pratt Bulb Drain  What is a Jackson-Pratt bulb? A Jackson-Pratt is a soft, round device used to collect drainage. It is connected to a long, thin drainage catheter, which is held in place by one or two small stiches near your surgical incision site. When the bulb is squeezed, it forms a vacuum, forcing the drainage to empty into the bulb.  Emptying the Jackson-Pratt bulb- To empty the bulb: 1. Release the plug on the top of the bulb. 2. Pour the bulb's contents into a measuring container which your nurse will provide. 3. Record the time emptied and amount of drainage. Empty the drain(s) as often as your     doctor or nurse recommends.  Date                  Time                    Amount (Drain 1)                 Amount (Drain 2)  _____________________________________________________________________  _____________________________________________________________________  _____________________________________________________________________  _____________________________________________________________________  _____________________________________________________________________  _____________________________________________________________________  _____________________________________________________________________  _____________________________________________________________________  Squeezing the Jackson-Pratt Bulb- To squeeze the bulb: 1. Make sure the plug at the top of the bulb is open. 2. Squeeze the bulb tightly in your fist. You will hear air squeezing from the bulb. 3. Replace the plug while the bulb is squeezed. 4. Use a safety pin to attach the bulb to your clothing. This will keep the catheter from     pulling at the bulb insertion site.  When to call your doctor- Call your doctor if: Drain site becomes red, swollen or hot. You have a fever greater than 101 degrees F. There is oozing at the drain  site. Drain falls out (apply a guaze bandage over the drain hole and secure it with tape). Drainage increases daily not related to activity patterns. (You will usually have more drainage when you are active than when you are resting.) Drainage has a bad odor.   

## 2022-02-01 NOTE — Progress Notes (Signed)
  Transition of Care [facility]) Screening Note   Patient Details  Name: Samantha Pena Date of Birth: 03-14-1966   Transition of Care [facility]) CM/SW Contact:    Bartholomew Crews, RN Phone Number: [phone] 02/01/2022, 8:29 AM    Transition of Care Department [facility]) has reviewed patient and no TOC needs have been identified at this time. We will continue to monitor patient advancement through interdisciplinary progression rounds. If new patient transition needs arise, please place a TOC consult.

## 2022-02-01 NOTE — Anesthesia Postprocedure Evaluation (Signed)
Anesthesia Post Note  Patient: Samantha Pena  Procedure(s) Performed: LEFT NIPPLE SPARING MASTECTOMY WITH AXILLARY SENTINEL LYMPH NODE BIOPSY (Left: Breast) BREAST RECONSTRUCTION WITH PLACEMENT OF TISSUE EXPANDER AND ALLODERM (Left: Breast)     Patient location during evaluation: PACU Anesthesia Type: General Level of consciousness: awake and alert Pain management: pain level controlled Vital Signs Assessment: post-procedure vital signs reviewed and stable Respiratory status: spontaneous breathing, nonlabored ventilation, respiratory function stable and patient connected to nasal cannula oxygen Cardiovascular status: blood pressure returned to baseline and stable Postop Assessment: no apparent nausea or vomiting Anesthetic complications: no   No notable events documented.  Last Vitals:  Vitals:   02/01/22 0602 02/01/22 0731  BP: 102/66 101/65  Pulse: 62 63  Resp: 18 18  Temp: 36.8 C 36.8 C  SpO2: 100% 100%    Last Pain:  Vitals:   02/01/22 0731  TempSrc: Oral  PainSc:                  Santa Lighter

## 2022-02-01 NOTE — Progress Notes (Signed)
Discharge instructions reviewed with pt and husband.  Breast cancer bag provided to pt, JP care and demonstration with return demonstration by husband was done successfully and able to record output accurately.   Incision/wound care instructions reviewed and handouts provided.  Copy of instructions given to pt, pt had scripts filled prior to admit for surgery.   Pt d/c'd via wheelchair with belongings, with husband.            Escorted by unit staff.

## 2022-02-02 ENCOUNTER — Encounter: Payer: Self-pay | Admitting: *Deleted

## 2022-02-06 LAB — SURGICAL PATHOLOGY

## 2022-02-07 ENCOUNTER — Encounter: Payer: Self-pay | Admitting: *Deleted

## 2022-02-09 NOTE — Progress Notes (Signed)
Patient Care Team: Marin Olp, MD as PCP - General (Family Medicine) Tiajuana Amass, MD as Referring Physician (Allergy and Immunology) Mauro Kaufmann, RN as Oncology Nurse Navigator Rockwell Germany, RN as Oncology Nurse Navigator Nicholas Lose, MD as Consulting Physician (Hematology and Oncology)  DIAGNOSIS:  Encounter Diagnosis  Name Primary?   Malignant 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, estrogen receptor positive ([place])     SUMMARY OF ONCOLOGIC HISTORY: Oncology History  Malignant 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, estrogen receptor positive ([place])  12/16/2021 Initial Diagnosis   Screening mammogram detected left breast distortion and calcifications in the central upper quadrant.  Ultrasound at 11 o'clock position there was a 2 cm mass.  Axilla negative.  Ultrasound-guided biopsy revealed grade 2 invasive ductal carcinoma ER 95%, PR 100%, Ki-67 10%, HER2 negative   12/28/2021 Cancer Staging   Staging form: Breast, AJCC 8th Edition - Clinical: Stage IA (cT1c, cN0, cM0, G2, ER+, PR+, HER2-) - Signed by Nicholas Lose, MD on 12/28/2021 Stage prefix: Initial diagnosis Histologic grading system: 3 grade system   02/13/2022 Cancer Staging   Staging form: Breast, AJCC 8th Edition - Pathologic: Stage IB (pT3, pN1, cM0, G2, ER+, PR+, HER2-) - Signed by Nicholas Lose, MD on 02/13/2022 Histologic grading system: 3 grade system     CHIEF COMPLIANT: Follow-up left breast cancer  INTERVAL HISTORY: Samantha Pena is a 56 y.o with the above-mentioned left breast cancer. She presents to the clinic for a follow-up. She reports that she has been off tamoxifen and wanted to know if she should resume. Her main concern was about the nipple and if the mammogram would pick up. She states that she did do ok on the tamoxifen. She does have some breast pain and discomfort.    ALLERGIES:  has No Known Allergies.  MEDICATIONS:  Current Outpatient  Medications  Medication Sig Dispense Refill   ALPRAZolam (XANAX) 0.25 MG tablet Take 0.25 mg by mouth every 8 (eight) hours as needed for anxiety.     Ascorbic Acid (VITAMIN C) 1000 MG tablet Take 1,000 mg by mouth daily as needed (immune support).     Calcium Carb-Cholecalciferol (CALCIUM 600 + D PO) Take 1 tablet by mouth daily.     fluticasone (FLONASE) 50 MCG/ACT nasal spray Place 2 sprays into both nostrils daily.     ibuprofen (ADVIL) 200 MG tablet Take 400 mg by mouth every 6 (six) hours as needed for moderate pain.     levocetirizine (XYZAL) 5 MG tablet Take 5 mg by mouth every evening.     Multiple Vitamins-Minerals (MULTI FOR HER PO) Take 1 tablet by mouth daily.     oxybutynin (DITROPAN-XL) 10 MG 24 hr tablet Take 10 mg by mouth at bedtime.     SUMAtriptan (IMITREX) 20 MG/ACT nasal spray USE 1 SPRAY INTO EACH NOSTRIL EVERY 2 HOURS AS NEEDED (max twice per day) 6 each 11   tretinoin (RETIN-A) 0.025 % cream Apply 1 application  topically daily as needed (acne).     zinc gluconate 50 MG tablet Take 50 mg by mouth daily.     zolpidem (AMBIEN) 10 MG tablet Take 10 mg by mouth at bedtime.     No current facility-administered medications for this visit.    PHYSICAL EXAMINATION: ECOG PERFORMANCE STATUS: 1 - Symptomatic but completely ambulatory  Vitals:   02/13/22 0947  BP: 132/85  Pulse: 95  Resp: 18  Temp: (!) 97.5 F (36.4  C)  SpO2: 100%   Filed Weights   02/13/22 0947  Weight: 150 lb 3.2 oz (68.1 kg)      LABORATORY DATA:  I have reviewed the data as listed    Latest Ref Rng & Units 01/31/2022    7:29 AM 06/24/2021   11:52 AM 10/08/2020    2:40 PM  CMP  Glucose 70 - 99 mg/dL 112  120  138   BUN 6 - 20 mg/dL 11  15  7    Creatinine 0.44 - 1.00 mg/dL 0.92  0.95  0.93   Sodium 135 - 145 mmol/L 138  139  138   Potassium 3.5 - 5.1 mmol/L 3.8  4.1  3.8   Chloride 98 - 111 mmol/L 108  103  101   CO2 22 - 32 mmol/L 22  31  22    Calcium 8.9 - 10.3 mg/dL 9.0  9.3  9.6    Total Protein 6.0 - 8.3 g/dL  6.8  7.7   Total Bilirubin 0.2 - 1.2 mg/dL  0.4  1.0   Alkaline Phos 39 - 117 U/L  34  37   AST 0 - 37 U/L  15  11   ALT 0 - 35 U/L  8  6     Lab Results  Component Value Date   WBC 4.1 01/31/2022   HGB 13.2 01/31/2022   HCT 40.6 01/31/2022   MCV 88.3 01/31/2022   PLT 233 01/31/2022   NEUTROABS 3.1 06/24/2021    ASSESSMENT & PLAN:  Malignant 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, estrogen receptor positive ([place]) 12/16/2021:Screening mammogram detected left breast distortion and calcifications in the central upper quadrant.  Ultrasound at 11 o'clock position there was a 2 cm mass.  Axilla negative.  Ultrasound-guided biopsy revealed grade 2 invasive ductal carcinoma ER 95%, PR 100%, Ki-67 10%, HER2 negative  Treatment plan: 1.  Left mastectomy 01/31/2022: Grade 2 IDC 6 cm, 2/2 lymph nodes positive, ER 95%, PR 100%, HER2 negative, Ki-67 10%, nipple biopsy: Positive 2. Oncotype DX testing to determine if chemotherapy would be of any benefit followed by 3. Adjuvant radiation therapy followed by 4. Adjuvant antiestrogen therapy with tamoxifen (starting at 10 mg daily on 12/29/2021) 5.  Genetic testing ----------------------------------------------------------------------------------------------------------------------------- Pathology counseling: I discussed the final pathology report of the patient provided  a copy of this report. I discussed the margins as well as lymph node surgeries. We also discussed the final staging along with previously performed ER/PR and HER-2/neu testing.  Recommendation: 1.  Additional surgery: To remove the nipple and for additional nodal dissection 2. Oncotype DX testing 3.  Adjuvant radiation 4.  Antiestrogen therapy with ovarian function suppression plus  anastrozole and abemaciclib ------------------------------------------------------------------------------------------------- Return to clinic based on  Oncotype DX test result    No orders of the defined types were placed in this encounter.  The patient has a good understanding of the overall plan. she agrees with it. she will call with any problems that may develop before the next visit here. Total time spent: 30 mins including face to face time and time spent for planning, charting and co-ordination of care   Harriette Ohara, MD 02/13/22    I Gardiner Coins am scribing for Dr. Lindi Adie  I have reviewed the above documentation for accuracy and completeness, and I agree with the above.

## 2022-02-10 ENCOUNTER — Inpatient Hospital Stay: Payer: 59 | Admitting: Hematology and Oncology

## 2022-02-13 ENCOUNTER — Encounter: Payer: Self-pay | Admitting: *Deleted

## 2022-02-13 ENCOUNTER — Other Ambulatory Visit: Payer: Self-pay

## 2022-02-13 ENCOUNTER — Inpatient Hospital Stay: Payer: 59 | Attending: Hematology and Oncology | Admitting: Hematology and Oncology

## 2022-02-13 ENCOUNTER — Telehealth: Payer: Self-pay | Admitting: *Deleted

## 2022-02-13 DIAGNOSIS — Z170 Estrogen receptor positive status [ER+]: Secondary | ICD-10-CM | POA: Diagnosis not present

## 2022-02-13 DIAGNOSIS — C50212 Malignant neoplasm of upper-inner quadrant of left female breast: Secondary | ICD-10-CM | POA: Insufficient documentation

## 2022-02-13 DIAGNOSIS — Z9012 Acquired absence of left breast and nipple: Secondary | ICD-10-CM | POA: Insufficient documentation

## 2022-02-13 DIAGNOSIS — Z923 Personal history of irradiation: Secondary | ICD-10-CM | POA: Diagnosis not present

## 2022-02-13 DIAGNOSIS — Z79899 Other long term (current) drug therapy: Secondary | ICD-10-CM | POA: Diagnosis not present

## 2022-02-13 NOTE — Telephone Encounter (Signed)
Received order for Oncotype testing. Requisition faxed to pathology

## 2022-02-13 NOTE — Assessment & Plan Note (Addendum)
12/16/2021:Screening mammogram detected left breast distortion and calcifications in the central upper quadrant.  Ultrasound at 11 o'clock position there was a 2 cm mass.  Axilla negative.  Ultrasound-guided biopsy revealed grade 2 invasive ductal carcinoma ER 95%, PR 100%, Ki-67 10%, HER2 negative  Treatment plan: 1.  Left mastectomy 01/31/2022: Grade 2 IDC 6 cm, 2/2 lymph nodes positive, ER 95%, PR 100%, HER2 negative, Ki-67 10%, nipple biopsy: Positive 2. Oncotype DX testing to determine if chemotherapy would be of any benefit followed by 3. Adjuvant radiation therapy followed by 4. Adjuvant antiestrogen therapy with tamoxifen (starting at 10 mg daily on 12/29/2021) 5.  Genetic testing ----------------------------------------------------------------------------------------------------------------------------- Pathology counseling: I discussed the final pathology report of the patient provided  a copy of this report. I discussed the margins as well as lymph node surgeries. We also discussed the final staging along with previously performed ER/PR and HER-2/neu testing.  Recommendation: 1.  Additional surgery: To remove the nipple and for additional nodal dissection 2. Oncotype DX testing 3.  Adjuvant radiation 4.  Antiestrogen therapy with ovarian function suppression plus  anastrozole and abemaciclib ------------------------------------------------------------------------------------------------- Return to clinic based on Oncotype DX test result

## 2022-02-20 ENCOUNTER — Other Ambulatory Visit: Payer: Self-pay | Admitting: Hematology and Oncology

## 2022-02-27 ENCOUNTER — Other Ambulatory Visit: Payer: Self-pay

## 2022-02-27 ENCOUNTER — Encounter: Payer: Self-pay | Admitting: *Deleted

## 2022-02-27 ENCOUNTER — Ambulatory Visit: Payer: 59 | Attending: General Surgery | Admitting: Physical Therapy

## 2022-02-27 DIAGNOSIS — Z483 Aftercare following surgery for neoplasm: Secondary | ICD-10-CM | POA: Diagnosis present

## 2022-02-27 DIAGNOSIS — C50212 Malignant neoplasm of upper-inner quadrant of left female breast: Secondary | ICD-10-CM | POA: Diagnosis present

## 2022-02-27 DIAGNOSIS — Z170 Estrogen receptor positive status [ER+]: Secondary | ICD-10-CM | POA: Diagnosis present

## 2022-02-27 DIAGNOSIS — R293 Abnormal posture: Secondary | ICD-10-CM | POA: Insufficient documentation

## 2022-02-27 NOTE — Therapy (Signed)
OUTPATIENT PHYSICAL THERAPY BREAST CANCER POST OP FOLLOW UP   Patient Name: Samantha Pena MRN: [id number] DOB:04/28/1966, 56 y.o., female Today's Date: 02/27/2022   PT End of Session - 02/27/22 1202     Visit Number 2    Number of Visits 10    Date for PT Re-Evaluation 03/27/22    PT Start Time 1200    PT Stop Time 9244    PT Time Calculation (min) 55 min    Activity Tolerance Patient tolerated treatment well    Behavior During Therapy [facility] for tasks assessed/performed             Past Medical History:  Diagnosis Date   Anxiety    Cancer ([place])    COVID-19    2021   Cyst 01/2011   2016 stable. near rectum that gives pt discomfort when sitting and has grown in size in the past year    Migraines    Past Surgical History:  Procedure Laterality Date   BREAST RECONSTRUCTION WITH PLACEMENT OF TISSUE EXPANDER AND ALLODERM Left 01/31/2022   Procedure: BREAST RECONSTRUCTION WITH PLACEMENT OF TISSUE EXPANDER AND ALLODERM;  Surgeon: Irene Limbo, MD;  Location: [place];  Service: Plastics;  Laterality: Left;   CERVICAL POLYPECTOMY  02/21/2021   uterus   CESAREAN SECTION  03/18/1999   FOOT SURGERY Right 09/30/2002   bone in toe had to be shaved and realigned    LAPAROSCOPIC APPENDECTOMY N/A 10/08/2020   Procedure: APPENDECTOMY LAPAROSCOPIC;  Surgeon: Rolm Bookbinder, MD;  Location: [place];  Service: General;  Laterality: N/A;   NIPPLE SPARING MASTECTOMY WITH SENTINEL LYMPH NODE BIOPSY Left 01/31/2022   Procedure: LEFT NIPPLE SPARING MASTECTOMY WITH AXILLARY SENTINEL LYMPH NODE BIOPSY;  Surgeon: Rolm Bookbinder, MD;  Location: [place];  Service: General;  Laterality: Left;   Patient Active Problem List   Diagnosis Date Noted   S/P left mastectomy 01/31/2022   Malignant 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, estrogen receptor positive ([place]) 12/28/2021   S/P laparoscopic appendectomy 10/08/2020   History of Lyme disease 11/12/2018   Urge incontinence  11/12/2017   Overweight 05/07/2017   Allergic rhinitis 05/07/2017   Hyperlipidemia 10/31/2016   TMJ disease 03/17/2015   Anal benign neoplasm 02/14/2011   ACTINIC KERATOSIS, CHEEK, LEFT 11/13/2007   Migraine headache 10/30/2006    REFERRING PROVIDER: Dr. Rolm Bookbinder  REFERRING DIAG: Left breast cancer  THERAPY DIAG:  Malignant 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, estrogen receptor positive ([place])  Abnormal posture  Aftercare following surgery for neoplasm  Rationale for Evaluation and Treatment: Rehabilitation  ONSET DATE: 01/31/2022  SUBJECTIVE:  SUBJECTIVE STATEMENT: Patient reports she underwent a left nipple sparing mastectomy with immediate reconstruction with expanders placed and a sentinel node biopsy (1/1 node positive) on 01/31/2022. There was a positive margin at the nipple area so she will have additional surgery for that and a possible axillary lymph node dissection. She is awaiting Oncotype results. The 2nd drain was removed a week ago.  PERTINENT HISTORY:  Patient was diagnosed on 12/19/2021 with left grade 2 invasive ductal carcinoma breast cancer. She underwent a left mastectomy with immediate reconstruction with expanders placed and a sentinel node biopsy (1/1 node positive) on 01/31/2022. It is ER/PR positive and HER2 negative with a Ki67 of 10%.   PATIENT GOALS:  Reassess how my recovery is going related to arm function, pain, and swelling.  PAIN:  Are you having pain? Yes: NPRS scale: 5/10 Pain location: Left chest, axilla, and lateral trunk Pain description: soreness Aggravating factors: Unable to sleep Relieving factors: nothing  PRECAUTIONS: Recent Surgery, left UE Lymphedema risk  ACTIVITY LEVEL / LEISURE: She is walking some but nothing  strenuous.   OBJECTIVE:   PATIENT SURVEYS:  QUICK DASH:  Quick Dash - 02/27/22 0001     Open a tight or new jar Mild difficulty    Do heavy household chores (wash walls, wash floors) Mild difficulty    Carry a shopping bag or briefcase Mild difficulty    Wash your back Moderate difficulty    Use a knife to cut food Mild difficulty    Recreational activities in which you take some force or impact through your arm, shoulder, or hand (golf, hammering, tennis) Mild difficulty    During the past week, to what extent has your arm, shoulder or hand problem interfered with your normal social activities with family, friends, neighbors, or groups? Slightly    During the past week, to what extent has your arm, shoulder or hand problem limited your work or other regular daily activities Slightly    Arm, shoulder, or hand pain. Mild    Tingling (pins and needles) in your arm, shoulder, or hand Mild    Difficulty Sleeping Moderate difficulty    DASH Score 29.55 %              OBSERVATIONS: Incision appears to be healing well with some scabby areas still present. Drain sites have healed nicely. Some mild edema present on left lateral chest. No cording present. Left scapula with little to no movement. Palpable tightness present left rhomboid and upper trap region. Issued compression foam in soft stockinette for pt to apply to lateral trunk under bra to reduce pain. She reported relief.  POSTURE:  Forward head and rounded shoulders posture  LYMPHEDEMA ASSESSMENT:   UPPER EXTREMITY AROM/PROM:   A/PROM RIGHT   eval    Shoulder extension 53  Shoulder flexion 145  Shoulder abduction 146  Shoulder internal rotation 78  Shoulder external rotation 84                          (Blank rows = not tested)   A/PROM LEFT   eval LEFT 02/27/2022  Shoulder extension 53 40  Shoulder flexion 141 100  Shoulder abduction 160 94  Shoulder internal rotation 57 58  Shoulder external rotation 72 62                           (Blank rows = not tested)  CERVICAL AROM: All within normal limits   UPPER EXTREMITY STRENGTH: WNL     LYMPHEDEMA ASSESSMENTS:    LANDMARK RIGHT   eval RIGHT 02/27/2022  10 cm proximal to olecranon process 27.8 26.5  Olecranon process 25.3 24.4  10 cm proximal to ulnar styloid process 21.1 20.4  Just proximal to ulnar styloid process 16 15.7  Across hand at thumb web space 18.2 18.2  At base of 2nd digit 6.2 6.1  (Blank rows = not tested)   LANDMARK LEFT   eval LEFT 02/27/2022  10 cm proximal to olecranon process 28 27  Olecranon process _0 cm proximal to ulnar styloid process 20.7 20.5  Just proximal to ulnar styloid process 15.5 15.7  Across hand at thumb web space 17.5 17  At base of 2nd digit 5.9 6  (Blank rows = not tested)    Surgery type/Date: Left mastectomy with reconstruction and sentinel node biopsy 01/31/2022 Number of lymph nodes removed: 1 Current/past treatment (chemo, radiation, hormone therapy): none Other symptoms:  Heaviness/tightness Yes Pain Yes Pitting edema No Infections No Decreased scar mobility Yes Stemmer sign No  PATIENT EDUCATION:  Education details: HEP - closed chain flexion and previously issued HEP Person educated: Patient Education method: Explanation, Demonstration, and Handouts Education comprehension: verbalized understanding and returned demonstration  HOME EXERCISE PROGRAM: Reviewed previously given post op HEP and gave closed chain shoulder flexion.  ASSESSMENT:  CLINICAL IMPRESSION: Patient is healing and recovering as expected s/p left mastectomy with sentinel node biopsy and expander placed on 01/31/2022. She has very limited shoulder ROM and pain with activity. Her incisions are healing well, although there is tenderness at her drain site incisions and her inferior breast incision, as is expected. Scar tissue present and uncomfortable per her report. Mild edema present on lateral trunk.  She will benefit from PT to regain shoulder ROM and function to as great a degree as possible prior to undergoing further surgery to re-excision for negative margins and possibly for additional lymph node surgery.  Pt will benefit from skilled therapeutic intervention to improve on the following deficits: Decreased knowledge of precautions, impaired UE functional use, pain, decreased ROM, postural dysfunction.   PT treatment/interventions: ADL/Self care home management, Therapeutic exercises, Therapeutic activity, Patient/Family education, Self Care, Joint mobilization, Dry Needling, Spinal mobilization, Manual lymph drainage, scar mobilization, Manual therapy, and Re-evaluation   GOALS: Goals reviewed with patient? Yes  LONG TERM GOALS:  (STG=LTG)  GOALS Name Target Date  Goal status  1 Pt will demonstrate she has regained full shoulder ROM and function post operatively compared to baselines.  Baseline: 03/27/2022 IN PROGRESS  2 Patient will demonstrate she has increased left shoulder flexion to >/= 140 degrees to reach overhead. 03/27/2022 INITIAL  3 Patient will increase left shoulder abduction to >/= 140 degrees to achieve radiation positioning. 03/27/2022 INITIAL  4 Patient will improve her DASH score to be </= 8 for improved overall UE function. 03/27/2022 INITIAL  5 Patient will report pain </= 2/10 with most of her daily activities to allow for better sleep and tolerate normal tasks. 03/27/2022 INITIAL     PLAN:  PT FREQUENCY/DURATION: 2x/week for 4 weeks  PLAN FOR NEXT SESSION: PROM, AAROM exercises   [facility]  [address]  [place] [address]  [phone]  After Breast Cancer Class It is recommended you attend the ABC class to be educated on lymphedema risk reduction. This class is free of charge and lasts for  1 hour. It is a 1-time class. You will need to download the Webex app either on your phone or computer. We will send you a  link the night before or the morning of the class. You should be able to click on that link to join the class. This is not a confidential class. You don't have to turn your camera on, but other participants may be able to see your email address. You are scheduled for November 20th at 12:00.  Scar massage You can begin gentle scar massage to you incision sites. Gently place one hand on the incision and move the skin (without sliding on the skin) in various directions. Do this for a few minutes and then you can gently massage either coconut oil or vitamin E cream into the scars.  Compression garment You should continue wearing your compression bra until you feel like you no longer have swelling.  Home exercise Program Continue doing the exercises you were given until you feel like you can do them without feeling any tightness at the end.   Walking Program Studies show that 30 minutes of walking per day (fast enough to elevate your heart rate) can significantly reduce the risk of a cancer recurrence. If you can't walk due to other medical reasons, we encourage you to find another activity you could do (like a stationary bike or water exercise).  Posture After breast cancer surgery, people frequently sit with rounded shoulders posture because it puts their incisions on slack and feels better. If you sit like this and scar tissue forms in that position, you can become very tight and have pain sitting or standing with good posture. Try to be aware of your posture and sit and stand up tall to heal properly.  Follow up PT: It is recommended you return every 3 months for the first 3 years following surgery to be assessed on the SOZO machine for an L-Dex score. This helps prevent clinically significant lymphedema in 95% of patients. These follow up screens are 10 minute appointments that you are not billed for.  Annia Friendly, [place] 02/27/22 1:23 PM

## 2022-02-27 NOTE — Patient Instructions (Addendum)
Cane Exercise: Abduction    Hold cane with right hand over end, palm-up, with other hand palm-down. Move arm out from side and up by pushing with other arm. Hold __5__ seconds. Repeat __5__ times. Do ___2_ sessions per day.  http://gt2.exer.us/82   Copyright  VHI. All rights reserved.   Then do this same exercise but instead of pushing it out to the side, push it straight up in front of you, pushing with your right hand. Repeat 5 times, holding 5 seconds, twice a day.  After Breast Cancer Class It is recommended you attend the ABC class to be educated on lymphedema risk reduction. This class is free of charge and lasts for 1 hour. It is a 1-time class. You will need to download the Webex app either on your phone or computer. We will send you a link the night before or the morning of the class. You should be able to click on that link to join the class. This is not a confidential class. You don't have to turn your camera on, but other participants may be able to see your email address. You are scheduled for November 20th at 12:00.  Scar massage You can begin gentle scar massage to you incision sites. Gently place one hand on the incision and move the skin (without sliding on the skin) in various directions. Do this for a few minutes and then you can gently massage either coconut oil or vitamin E cream into the scars.  Compression garment You should continue wearing your compression bra until you feel like you no longer have swelling.  Home exercise Program Continue doing the exercises you were given until you feel like you can do them without feeling any tightness at the end.   Walking Program Studies show that 30 minutes of walking per day (fast enough to elevate your heart rate) can significantly reduce the risk of a cancer recurrence. If you can't walk due to other medical reasons, we encourage you to find another activity you could do (like a stationary bike or water  exercise).  Posture After breast cancer surgery, people frequently sit with rounded shoulders posture because it puts their incisions on slack and feels better. If you sit like this and scar tissue forms in that position, you can become very tight and have pain sitting or standing with good posture. Try to be aware of your posture and sit and stand up tall to heal properly.  Follow up PT: It is recommended you return every 3 months for the first 3 years following surgery to be assessed on the SOZO machine for an L-Dex score. This helps prevent clinically significant lymphedema in 95% of patients. These follow up screens are 10 minute appointments that you are not billed for.

## 2022-03-03 ENCOUNTER — Encounter: Payer: Self-pay | Admitting: Hematology and Oncology

## 2022-03-03 ENCOUNTER — Encounter: Payer: Self-pay | Admitting: *Deleted

## 2022-03-06 ENCOUNTER — Encounter: Payer: Self-pay | Admitting: *Deleted

## 2022-03-06 ENCOUNTER — Encounter (HOSPITAL_COMMUNITY): Payer: Self-pay

## 2022-03-06 ENCOUNTER — Ambulatory Visit: Payer: 59 | Attending: General Surgery

## 2022-03-06 ENCOUNTER — Telehealth: Payer: Self-pay | Admitting: *Deleted

## 2022-03-06 DIAGNOSIS — Z170 Estrogen receptor positive status [ER+]: Secondary | ICD-10-CM | POA: Insufficient documentation

## 2022-03-06 DIAGNOSIS — C50212 Malignant neoplasm of upper-inner quadrant of left female breast: Secondary | ICD-10-CM | POA: Insufficient documentation

## 2022-03-06 DIAGNOSIS — Z483 Aftercare following surgery for neoplasm: Secondary | ICD-10-CM | POA: Insufficient documentation

## 2022-03-06 DIAGNOSIS — R293 Abnormal posture: Secondary | ICD-10-CM | POA: Insufficient documentation

## 2022-03-06 NOTE — Therapy (Signed)
OUTPATIENT PHYSICAL THERAPY BREAST CANCER TREATMENT   Patient Name: Samantha Pena MRN: [id number] DOB:1965/11/09, 56 y.o., female Today's Date: 03/06/2022   PT End of Session - 03/06/22 1200     Visit Number 3    Number of Visits 10    Date for PT Re-Evaluation 03/27/22    PT Start Time 7076    PT Stop Time 1518    PT Time Calculation (min) 52 min    Activity Tolerance Patient tolerated treatment well    Behavior During Therapy [facility] for tasks assessed/performed             Past Medical History:  Diagnosis Date   Anxiety    Cancer ([place])    COVID-19    2021   Cyst 01/2011   2016 stable. near rectum that gives pt discomfort when sitting and has grown in size in the past year    Migraines    Past Surgical History:  Procedure Laterality Date   BREAST RECONSTRUCTION WITH PLACEMENT OF TISSUE EXPANDER AND ALLODERM Left 01/31/2022   Procedure: BREAST RECONSTRUCTION WITH PLACEMENT OF TISSUE EXPANDER AND ALLODERM;  Surgeon: Irene Limbo, MD;  Location: [place];  Service: Plastics;  Laterality: Left;   CERVICAL POLYPECTOMY  02/21/2021   uterus   CESAREAN SECTION  03/18/1999   FOOT SURGERY Right 09/30/2002   bone in toe had to be shaved and realigned    LAPAROSCOPIC APPENDECTOMY N/A 10/08/2020   Procedure: APPENDECTOMY LAPAROSCOPIC;  Surgeon: Rolm Bookbinder, MD;  Location: [place];  Service: General;  Laterality: N/A;   NIPPLE SPARING MASTECTOMY WITH SENTINEL LYMPH NODE BIOPSY Left 01/31/2022   Procedure: LEFT NIPPLE SPARING MASTECTOMY WITH AXILLARY SENTINEL LYMPH NODE BIOPSY;  Surgeon: Rolm Bookbinder, MD;  Location: [place];  Service: General;  Laterality: Left;   Patient Active Problem List   Diagnosis Date Noted   S/P left mastectomy 01/31/2022   Malignant 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, estrogen receptor positive ([place]) 12/28/2021   S/P laparoscopic appendectomy 10/08/2020   History of Lyme disease 11/12/2018   Urge incontinence 11/12/2017    Overweight 05/07/2017   Allergic rhinitis 05/07/2017   Hyperlipidemia 10/31/2016   TMJ disease 03/17/2015   Anal benign neoplasm 02/14/2011   ACTINIC KERATOSIS, CHEEK, LEFT 11/13/2007   Migraine headache 10/30/2006    REFERRING PROVIDER: Dr. Rolm Bookbinder  REFERRING DIAG: Left breast cancer  THERAPY DIAG:  Malignant 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, estrogen receptor positive ([place])  Abnormal posture  Aftercare following surgery for neoplasm  Rationale for Evaluation and Treatment: Rehabilitation  ONSET DATE: 01/31/2022  SUBJECTIVE:  SUBJECTIVE STATEMENT: Got oncotype results in my chart, but I don't know  the results yet because I missed the call.. The foam helps a little but  the breast  is still tender. Pt reports doing the exercises and they hard but are going OK.  PERTINENT HISTORY:  Patient was diagnosed on 12/19/2021 with left grade 2 invasive ductal carcinoma breast cancer. She underwent a left mastectomy with immediate reconstruction with expanders placed and a sentinel node biopsy (1/1 node positive) on 01/31/2022. It is ER/PR positive and HER2 negative with a Ki67 of 10%.   PATIENT GOALS:  Reassess how my recovery is going related to arm function, pain, and swelling.  PAIN:  Are you having pain? No not today, just discomfort.  PRECAUTIONS: Recent Surgery, left UE Lymphedema risk  ACTIVITY LEVEL / LEISURE: She is walking some but nothing strenuous.   OBJECTIVE:   PATIENT SURVEYS:  QUICK DASH:     OBSERVATIONS: Incision appears to be healing well with some scabby areas still present. Drain sites have healed nicely. Some mild edema present on left lateral chest. No cording present. Left scapula with little to no movement. Palpable tightness present  left rhomboid and upper trap region. Issued compression foam in soft stockinette for pt to apply to lateral trunk under bra to reduce pain. She reported relief.  POSTURE:  Forward head and rounded shoulders posture  LYMPHEDEMA ASSESSMENT:   UPPER EXTREMITY AROM/PROM:   A/PROM RIGHT   eval    Shoulder extension 53  Shoulder flexion 145  Shoulder abduction 146  Shoulder internal rotation 78  Shoulder external rotation 84                          (Blank rows = not tested)   A/PROM LEFT   eval LEFT 02/27/2022  Shoulder extension 53 40  Shoulder flexion 141 100  Shoulder abduction 160 94  Shoulder internal rotation 57 58  Shoulder external rotation 72 62                          (Blank rows = not tested)     CERVICAL AROM: All within normal limits   UPPER EXTREMITY STRENGTH: WNL     LYMPHEDEMA ASSESSMENTS:    LANDMARK RIGHT   eval RIGHT 02/27/2022  10 cm proximal to olecranon process 27.8 26.5  Olecranon process 25.3 24.4  10 cm proximal to ulnar styloid process 21.1 20.4  Just proximal to ulnar styloid process 16 15.7  Across hand at thumb web space 18.2 18.2  At base of 2nd digit 6.2 6.1  (Blank rows = not tested)   LANDMARK LEFT   eval LEFT 02/27/2022  10 cm proximal to olecranon process 28 27  Olecranon process _0 cm proximal to ulnar styloid process 20.7 20.5  Just proximal to ulnar styloid process 15.5 15.7  Across hand at thumb web space 17.5 17  At base of 2nd digit 5.9 6  (Blank rows = not tested)    Surgery type/Date: Left mastectomy with reconstruction and sentinel node biopsy 01/31/2022 Number of lymph nodes removed: 1 Current/past treatment (chemo, radiation, hormone therapy): none Other symptoms:  Heaviness/tightness Yes Pain Yes Pitting edema No Infections No Decreased scar mobility Yes Stemmer sign No  TREATMENT TODAY 03/06/2022 Incisions healing but with scabbing still present Soft tissue mobilization to left UT, pectorals,  lateral trunk and SL to left UT and scapular area  with cocoa butter. Reviewed supine AA shoulder flexion and stargazer x 5 ea  and LTR with arms outstretched x 5 all with VC's to relax PROM left shoulder flexion, scaption, abd, IR and ER with VC's to relax After dressing pt practiced scapular retraction and wall slides x 5 ea, VC's to relax UT with wall slides Several thin chords noted in axilla with one crossing elbow   PATIENT EDUCATION:  Education details: HEP - closed chain flexion and previously issued HEP Person educated: Patient Education method: Explanation, Demonstration, and Handouts Education comprehension: verbalized understanding and returned demonstration  HOME EXERCISE PROGRAM: Reviewed previously given post op HEP and gave closed chain shoulder flexion.  ASSESSMENT:  CLINICAL IMPRESSION: Pt postures with left hand to thigh while standing and requires VC's to relax throughout. Several small cords noted today in axilla, and one crossing the elbow. She was educated to relax left hand while performing ROM exercises and did well with supine AAROM for flexion and stargazer.  Standing wall slides were  much more difficult for her and she required VC's to avoid compensation with UT   perform skilled therapeutic intervention to improve on the following deficits: Decreased knowledge of precautions, impaired UE functional use, pain, decreased ROM, postural dysfunction.   PT treatment/interventions: ADL/Self care home management, Therapeutic exercises, Therapeutic activity, Patient/Family education, Self Care, Joint mobilization, Dry Needling, Spinal mobilization, Manual lymph drainage, scar mobilization, Manual therapy, and Re-evaluation   GOALS: Goals reviewed with patient? Yes  LONG TERM GOALS:  (STG=LTG)  GOALS Name Target Date  Goal status  1 Pt will demonstrate she has regained full shoulder ROM and function post operatively compared to baselines.  Baseline: 04/03/2022 IN  PROGRESS  2 Patient will demonstrate she has increased left shoulder flexion to >/= 140 degrees to reach overhead. 04/03/2022 INITIAL  3 Patient will increase left shoulder abduction to >/= 140 degrees to achieve radiation positioning. 04/03/2022 INITIAL  4 Patient will improve her DASH score to be </= 8 for improved overall UE function. 04/03/2022 INITIAL  5 Patient will report pain </= 2/10 with most of her daily activities to allow for better sleep and tolerate normal tasks. 03/27/2022 INITIAL     PLAN:  PT FREQUENCY/DURATION: 2x/week for 4 weeks  PLAN FOR NEXT SESSION: PROM, AAROM exercises, consider wand, STM prn   [facility]  [address], [address]  [place] [address]  [phone]  After Breast Cancer Class It is recommended you attend the ABC class to be educated on lymphedema risk reduction. This class is free of charge and lasts for 1 hour. It is a 1-time class. You will need to download the Webex app either on your phone or computer. We will send you a link the night before or the morning of the class. You should be able to click on that link to join the class. This is not a confidential class. You don't have to turn your camera on, but other participants may be able to see your email address. You are scheduled for November 20th at 12:00.  Scar massage You can begin gentle scar massage to you incision sites. Gently place one hand on the incision and move the skin (without sliding on the skin) in various directions. Do this for a few minutes and then you can gently massage either coconut oil or vitamin E cream into the scars.  Compression garment You should continue wearing your compression bra until you feel like you no longer have swelling.  Home exercise Program Continue doing the exercises you were given until you feel like you can do them without feeling any tightness at the end.   Walking Program Studies show that 30 minutes of walking per day  (fast enough to elevate your heart rate) can significantly reduce the risk of a cancer recurrence. If you can't walk due to other medical reasons, we encourage you to find another activity you could do (like a stationary bike or water exercise).  Posture After breast cancer surgery, people frequently sit with rounded shoulders posture because it puts their incisions on slack and feels better. If you sit like this and scar tissue forms in that position, you can become very tight and have pain sitting or standing with good posture. Try to be aware of your posture and sit and stand up tall to heal properly.  Follow up PT: It is recommended you return every 3 months for the first 3 years following surgery to be assessed on the SOZO machine for an L-Dex score. This helps prevent clinically significant lymphedema in 95% of patients. These follow up screens are 10 minute appointments that you are not billed for.  Annia Friendly, [place] 03/06/22 1:02 PM

## 2022-03-06 NOTE — Telephone Encounter (Signed)
Received oncotype results of 15/14%. Left message for a return phone call. Team notified.

## 2022-03-07 ENCOUNTER — Telehealth: Payer: Self-pay | Admitting: *Deleted

## 2022-03-07 NOTE — Telephone Encounter (Signed)
Spoke with patient about oncotype results and she would like to discuss plan (?ALND) with Dr. Lindi Adie.  Appt confirmed for 11/10 at 12.

## 2022-03-09 ENCOUNTER — Ambulatory Visit: Payer: 59

## 2022-03-09 DIAGNOSIS — Z170 Estrogen receptor positive status [ER+]: Secondary | ICD-10-CM

## 2022-03-09 DIAGNOSIS — R293 Abnormal posture: Secondary | ICD-10-CM

## 2022-03-09 DIAGNOSIS — C50212 Malignant neoplasm of upper-inner quadrant of left female breast: Secondary | ICD-10-CM | POA: Diagnosis not present

## 2022-03-09 DIAGNOSIS — Z483 Aftercare following surgery for neoplasm: Secondary | ICD-10-CM

## 2022-03-09 NOTE — Progress Notes (Signed)
Patient Care Team: Marin Olp, MD as PCP - General (Family Medicine) Tiajuana Amass, MD as Referring Physician (Allergy and Immunology) Mauro Kaufmann, RN as Oncology Nurse Navigator Rockwell Germany, RN as Oncology Nurse Navigator Nicholas Lose, MD as Consulting Physician (Hematology and Oncology)  DIAGNOSIS:  Encounter Diagnosis  Name Primary?   Malignant 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, estrogen receptor positive ([place]) Yes    SUMMARY OF ONCOLOGIC HISTORY: Oncology History  Malignant 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, estrogen receptor positive ([place])  12/16/2021 Initial Diagnosis   Screening mammogram detected left breast distortion and calcifications in the central upper quadrant.  Ultrasound at 11 o'clock position there was a 2 cm mass.  Axilla negative.  Ultrasound-guided biopsy revealed grade 2 invasive ductal carcinoma ER 95%, PR 100%, Ki-67 10%, HER2 negative   12/28/2021 Cancer Staging   Staging form: Breast, AJCC 8th Edition - Clinical: Stage IA (cT1c, cN0, cM0, G2, ER+, PR+, HER2-) - Signed by Nicholas Lose, MD on 12/28/2021 Stage prefix: Initial diagnosis Histologic grading system: 3 grade system   02/13/2022 Cancer Staging   Staging form: Breast, AJCC 8th Edition - Pathologic: Stage IB (pT3, pN1, cM0, G2, ER+, PR+, HER2-) - Signed by Nicholas Lose, MD on 02/13/2022 Histologic grading system: 3 grade system     CHIEF COMPLIANT: Follow-up left breast cancer    INTERVAL HISTORY: Samantha Pena is a 56 y.o with the above-mentioned left breast cancer. She presents to the clinic for a follow-up.  She has seen Dr. Donne Hazel who is scheduling her for axillary dissection as well as nipple resection.  She is here today accompanied by her husband to discuss the role of systemic chemotherapy.  ALLERGIES:  has No Known Allergies.  MEDICATIONS:  Current Outpatient Medications  Medication Sig Dispense Refill    ALPRAZolam (XANAX) 0.25 MG tablet Take 0.25 mg by mouth every 8 (eight) hours as needed for anxiety.     Ascorbic Acid (VITAMIN C) 1000 MG tablet Take 1,000 mg by mouth daily as needed (immune support).     Calcium Carb-Cholecalciferol (CALCIUM 600 + D PO) Take 1 tablet by mouth daily.     fluticasone (FLONASE) 50 MCG/ACT nasal spray Place 2 sprays into both nostrils daily.     ibuprofen (ADVIL) 200 MG tablet Take 400 mg by mouth every 6 (six) hours as needed for moderate pain.     levocetirizine (XYZAL) 5 MG tablet Take 5 mg by mouth every evening.     Multiple Vitamins-Minerals (MULTI FOR HER PO) Take 1 tablet by mouth daily.     oxybutynin (DITROPAN-XL) 10 MG 24 hr tablet Take 10 mg by mouth at bedtime.     SUMAtriptan (IMITREX) 20 MG/ACT nasal spray USE 1 SPRAY INTO EACH NOSTRIL EVERY 2 HOURS AS NEEDED (max twice per day) 6 each 11   tamoxifen (NOLVADEX) 10 MG tablet TAKE 1 TABLET BY MOUTH EVERY DAY 30 tablet 0   tretinoin (RETIN-A) 0.025 % cream Apply 1 application  topically daily as needed (acne).     zinc gluconate 50 MG tablet Take 50 mg by mouth daily.     zolpidem (AMBIEN) 10 MG tablet Take 10 mg by mouth at bedtime.     No current facility-administered medications for this visit.    PHYSICAL EXAMINATION: ECOG PERFORMANCE STATUS: 1 - Symptomatic but completely ambulatory  Vitals:   03/10/22 1210  BP: 138/79  Pulse: 74  Resp: 18  Temp: 97.9  F (36.6 C)  SpO2: 100%   Filed Weights   03/10/22 1210  Weight: 149 lb 14.4 oz (68 kg)      LABORATORY DATA:  I have reviewed the data as listed    Latest Ref Rng & Units 01/31/2022    7:29 AM 06/24/2021   11:52 AM 10/08/2020    2:40 PM  CMP  Glucose 70 - 99 mg/dL 112  120  138   BUN 6 - 20 mg/dL _0 Creatinine 0.44 - 1.00 mg/dL 0.92  0.95  0.93   Sodium 135 - 145 mmol/L 138  139  138   Potassium 3.5 - 5.1 mmol/L 3.8  4.1  3.8   Chloride 98 - 111 mmol/L 108  103  101   CO2 22 - 32 mmol/L _1 Calcium 8.9 -  10.3 mg/dL 9.0  9.3  9.6   Total Protein 6.0 - 8.3 g/dL  6.8  7.7   Total Bilirubin 0.2 - 1.2 mg/dL  0.4  1.0   Alkaline Phos 39 - 117 U/L  34  37   AST 0 - 37 U/L  15  11   ALT 0 - 35 U/L  8  6     Lab Results  Component Value Date   WBC 4.1 01/31/2022   HGB 13.2 01/31/2022   HCT 40.6 01/31/2022   MCV 88.3 01/31/2022   PLT 233 01/31/2022   NEUTROABS 3.1 06/24/2021    ASSESSMENT & PLAN:  Malignant 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, estrogen receptor positive ([place]) 12/16/2021:Screening mammogram detected left breast distortion and calcifications in the central upper quadrant.  Ultrasound at 11 o'clock position there was a 2 cm mass.  Axilla negative.  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  Left mastectomy 01/31/2022: Grade 2 IDC 6 cm, 1/1 lymph node positive, ER 95%, PR 100%, HER2 negative, Ki-67 10%, nipple biopsy: Positive 2. Oncotype DX 03/01/2022: Score: 15 (distant recurrence at 9 years: 14%) 3.  Additional surgery to remove nipple and other lymph nodes 4. Adjuvant radiation therapy followed by 5. Adjuvant antiestrogen therapy with tamoxifen (starting at 10 mg daily on 12/29/2021) ----------------------------------------------------------------------------------------------------------------------------- I discussed Oncotype test in great detail suggesting that if the lymph node involvement is less than 3 then she will need systemic chemotherapy.  We also discussed her case in the tumor board  We discussed that the treatment plan would include discussion regarding Verzinio tamoxifen. Her husband had many questions about ivermectin. She is very opposed to doing any systemic chemotherapy.  However we will discuss this more after she finishes her axillary surgery.  Follow-up after the axillary surgery to discuss the final pathology report.    No orders of the  defined types were placed in this encounter.  The patient has a good understanding of the overall plan. she agrees with it. she will call with any problems that may develop before the next visit here. Total time spent: 30 mins including face to face time and time spent for planning, charting and co-ordination of care   Harriette Ohara, MD 03/10/22    I Gardiner Coins am scribing for Dr. Lindi Adie  I have reviewed the above documentation for accuracy and completeness, and I agree with the above.

## 2022-03-09 NOTE — Patient Instructions (Signed)
SHOULDER: Flexion - Supine (Cane)        [facility]cer [facility] [phone]    Hold cane in both hands. Raise arms up overhead. Do not allow back to arch. Hold _5__ seconds. Do __5__ times; __2__ times a day.  Hands shoulder width apart  2. Hands slightly wider apart: Y position

## 2022-03-09 NOTE — Therapy (Signed)
OUTPATIENT PHYSICAL THERAPY BREAST CANCER TREATMENT   Patient Name: Samantha Pena MRN: [id number] DOB:[birth year], 56 y.o., female Today's Date: 03/09/2022   PT End of Session - 03/09/22 1204     Visit Number 4    Number of Visits 10    Date for PT Re-Evaluation 03/27/22    PT Start Time 1204    PT Stop Time 0315    PT Time Calculation (min) 49 min    Activity Tolerance Patient tolerated treatment well    Behavior During Therapy [facility] for tasks assessed/performed             Past Medical History:  Diagnosis Date   Anxiety    Cancer ([place])    COVID-19    2021   Cyst 01/2011   2016 stable. near rectum that gives pt discomfort when sitting and has grown in size in the past year    Migraines    Past Surgical History:  Procedure Laterality Date   BREAST RECONSTRUCTION WITH PLACEMENT OF TISSUE EXPANDER AND ALLODERM Left 01/31/2022   Procedure: BREAST RECONSTRUCTION WITH PLACEMENT OF TISSUE EXPANDER AND ALLODERM;  Surgeon: Irene Limbo, MD;  Location: [place];  Service: Plastics;  Laterality: Left;   CERVICAL POLYPECTOMY  02/21/2021   uterus   CESAREAN SECTION  03/18/1999   FOOT SURGERY Right 09/30/2002   bone in toe had to be shaved and realigned    LAPAROSCOPIC APPENDECTOMY N/A 10/08/2020   Procedure: APPENDECTOMY LAPAROSCOPIC;  Surgeon: Rolm Bookbinder, MD;  Location: [place];  Service: General;  Laterality: N/A;   NIPPLE SPARING MASTECTOMY WITH SENTINEL LYMPH NODE BIOPSY Left 01/31/2022   Procedure: LEFT NIPPLE SPARING MASTECTOMY WITH AXILLARY SENTINEL LYMPH NODE BIOPSY;  Surgeon: Rolm Bookbinder, MD;  Location: [place];  Service: General;  Laterality: Left;   Patient Active Problem List   Diagnosis Date Noted   S/P left mastectomy 01/31/2022   Malignant 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, estrogen receptor positive ([place]) 12/28/2021   S/P laparoscopic appendectomy 10/08/2020   History of Lyme disease 11/12/2018   Urge incontinence 11/12/2017    Overweight 05/07/2017   Allergic rhinitis 05/07/2017   Hyperlipidemia 10/31/2016   TMJ disease 03/17/2015   Anal benign neoplasm 02/14/2011   ACTINIC KERATOSIS, CHEEK, LEFT 11/13/2007   Migraine headache 10/30/2006    REFERRING PROVIDER: Dr. Rolm Bookbinder  REFERRING DIAG: Left breast cancer  THERAPY DIAG:  Malignant 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, estrogen receptor positive ([place])  Abnormal posture  Aftercare following surgery for neoplasm  Rationale for Evaluation and Treatment: Rehabilitation  ONSET DATE: 01/31/2022  SUBJECTIVE:  SUBJECTIVE STATEMENT: Dr. Donne Hazel called about the Oncotype that was 13. I see Dr. Lindi Adie tomorrow. I am trying to get my exercises in 2x/day  PERTINENT HISTORY:  Patient was diagnosed on 12/19/2021 with left grade 2 invasive ductal carcinoma breast cancer. She underwent a left mastectomy with immediate reconstruction with expanders placed and a sentinel node biopsy (1/1 node positive) on 01/31/2022. It is ER/PR positive and HER2 negative with a Ki67 of 10%.   PATIENT GOALS:  Reassess how my recovery is going related to arm function, pain, and swelling.  PAIN:  Are you having pain? No not today, just discomfort/soreness  PRECAUTIONS: Recent Surgery, left UE Lymphedema risk  ACTIVITY LEVEL / LEISURE: She is walking some but nothing strenuous.   OBJECTIVE:   PATIENT SURVEYS:  QUICK DASH:     OBSERVATIONS: Incision appears to be healing well with some scabby areas still present. Drain sites have healed nicely. Some mild edema present on left lateral chest. No cording present. Left scapula with little to no movement. Palpable tightness present left rhomboid and upper trap region. Issued compression foam in soft stockinette for pt  to apply to lateral trunk under bra to reduce pain. She reported relief.  POSTURE:  Forward head and rounded shoulders posture  LYMPHEDEMA ASSESSMENT:   UPPER EXTREMITY AROM/PROM:   A/PROM RIGHT   eval    Shoulder extension 53  Shoulder flexion 145  Shoulder abduction 146  Shoulder internal rotation 78  Shoulder external rotation 84                          (Blank rows = not tested)   A/PROM LEFT   eval LEFT 02/27/2022  Shoulder extension 53 40  Shoulder flexion 141 100  Shoulder abduction 160 94  Shoulder internal rotation 57 58  Shoulder external rotation 72 62                          (Blank rows = not tested)     CERVICAL AROM: All within normal limits   UPPER EXTREMITY STRENGTH: WNL     LYMPHEDEMA ASSESSMENTS:    LANDMARK RIGHT   eval RIGHT 02/27/2022  10 cm proximal to olecranon process 27.8 26.5  Olecranon process 25.3 24.4  10 cm proximal to ulnar styloid process 21.1 20.4  Just proximal to ulnar styloid process 16 15.7  Across hand at thumb web space 18.2 18.2  At base of 2nd digit 6.2 6.1  (Blank rows = not tested)   LANDMARK LEFT   eval LEFT 02/27/2022  10 cm proximal to olecranon process 28 27  Olecranon process _0 cm proximal to ulnar styloid process 20.7 20.5  Just proximal to ulnar styloid process 15.5 15.7  Across hand at thumb web space 17.5 17  At base of 2nd digit 5.9 6  (Blank rows = not tested)    Surgery type/Date: Left mastectomy with reconstruction and sentinel node biopsy 01/31/2022 Number of lymph nodes removed: 1 Current/past treatment (chemo, radiation, hormone therapy): none Other symptoms:  Heaviness/tightness Yes Pain Yes Pitting edema No Infections No Decreased scar mobility Yes Stemmer sign No  TREATMENT TODAY 03/09/2022  Pulleys x 2 min flex, 1 min scaption and abduction Soft tissue mobilization to left UT, pectorals, lateral trunk and SL to left UT and scapular area with cocoa butter. Instructed  supine AA shoulder flexion and scaption with wand x 5 ea PROM left  shoulder flexion, scaption, abd, IR and ER with VC's to relax MFR to left axillary area of cording  03/06/2022 Incisions healing but with scabbing still present Soft tissue mobilization to left UT, pectorals, lateral trunk and SL to left UT and scapular area with cocoa butter. Reviewed supine AA shoulder flexion and stargazer x 5 ea  and LTR with arms outstretched x 5 all with VC's to relax PROM left shoulder flexion, scaption, abd, IR and ER with VC's to relax After dressing pt practiced scapular retraction and wall slides x 5 ea, VC's to relax UT with wall slides Several thin chords noted in axilla with one crossing elbow   PATIENT EDUCATION:  Education details: HEP - closed chain flexion and previously issued HEP Person educated: Patient Education method: Explanation, Demonstration, and Handouts Education comprehension: verbalized understanding and returned demonstration  HOME EXERCISE PROGRAM: Reviewed previously given post op HEP and gave closed chain shoulder flexion.  ASSESSMENT:  CLINICAL IMPRESSION: Pt did well with supine wand exercises today and was visually going further. She has continued tightness in her lats, pecs and UT and she was reminded to try and remain in good posture. Cording was still present, but not as evident today.   perform skilled therapeutic intervention to improve on the following deficits: Decreased knowledge of precautions, impaired UE functional use, pain, decreased ROM, postural dysfunction.   PT treatment/interventions: ADL/Self care home management, Therapeutic exercises, Therapeutic activity, Patient/Family education, Self Care, Joint mobilization, Dry Needling, Spinal mobilization, Manual lymph drainage, scar mobilization, Manual therapy, and Re-evaluation   GOALS: Goals reviewed with patient? Yes  LONG TERM GOALS:  (STG=LTG)  GOALS Name Target Date  Goal status  1 Pt will  demonstrate she has regained full shoulder ROM and function post operatively compared to baselines.  Baseline: 04/06/2022 IN PROGRESS  2 Patient will demonstrate she has increased left shoulder flexion to >/= 140 degrees to reach overhead. 04/06/2022 INITIAL  3 Patient will increase left shoulder abduction to >/= 140 degrees to achieve radiation positioning. 04/06/2022 INITIAL  4 Patient will improve her DASH score to be </= 8 for improved overall UE function. 04/06/2022 INITIAL  5 Patient will report pain </= 2/10 with most of her daily activities to allow for better sleep and tolerate normal tasks. 03/27/2022 INITIAL     PLAN:  PT FREQUENCY/DURATION: 2x/week for 4 weeks  PLAN FOR NEXT SESSION: PROM, AAROM exercises, consider wand, STM prn,MFR to axilla   [facility]  [address], [address]  [place] [address]  [phone]  After Breast Cancer Class It is recommended you attend the ABC class to be educated on lymphedema risk reduction. This class is free of charge and lasts for 1 hour. It is a 1-time class. You will need to download the Webex app either on your phone or computer. We will send you a link the night before or the morning of the class. You should be able to click on that link to join the class. This is not a confidential class. You don't have to turn your camera on, but other participants may be able to see your email address. You are scheduled for November 20th at 12:00.  Scar massage You can begin gentle scar massage to you incision sites. Gently place one hand on the incision and move the skin (without sliding on the skin) in various directions. Do this for a few minutes and then you can gently massage either coconut oil or vitamin E cream into the  scars.  Compression garment You should continue wearing your compression bra until you feel like you no longer have swelling.  Home exercise Program Continue doing the exercises you were given until you  feel like you can do them without feeling any tightness at the end.   Walking Program Studies show that 30 minutes of walking per day (fast enough to elevate your heart rate) can significantly reduce the risk of a cancer recurrence. If you can't walk due to other medical reasons, we encourage you to find another activity you could do (like a stationary bike or water exercise).  Posture After breast cancer surgery, people frequently sit with rounded shoulders posture because it puts their incisions on slack and feels better. If you sit like this and scar tissue forms in that position, you can become very tight and have pain sitting or standing with good posture. Try to be aware of your posture and sit and stand up tall to heal properly.  Follow up PT: It is recommended you return every 3 months for the first 3 years following surgery to be assessed on the SOZO machine for an L-Dex score. This helps prevent clinically significant lymphedema in 95% of patients. These follow up screens are 10 minute appointments that you are not billed for.  Annia Friendly, [place] 03/09/22 12:56 PM

## 2022-03-10 ENCOUNTER — Other Ambulatory Visit: Payer: Self-pay

## 2022-03-10 ENCOUNTER — Encounter: Payer: Self-pay | Admitting: *Deleted

## 2022-03-10 ENCOUNTER — Inpatient Hospital Stay: Payer: 59 | Attending: Hematology and Oncology | Admitting: Hematology and Oncology

## 2022-03-10 VITALS — BP 138/79 | HR 74 | Temp 97.9°F | Resp 18 | Ht 66.0 in | Wt 149.9 lb

## 2022-03-10 DIAGNOSIS — Z923 Personal history of irradiation: Secondary | ICD-10-CM | POA: Diagnosis not present

## 2022-03-10 DIAGNOSIS — Z9012 Acquired absence of left breast and nipple: Secondary | ICD-10-CM | POA: Insufficient documentation

## 2022-03-10 DIAGNOSIS — Z79899 Other long term (current) drug therapy: Secondary | ICD-10-CM | POA: Diagnosis not present

## 2022-03-10 DIAGNOSIS — Z79810 Long term (current) use of selective estrogen receptor modulators (SERMs): Secondary | ICD-10-CM | POA: Insufficient documentation

## 2022-03-10 DIAGNOSIS — Z170 Estrogen receptor positive status [ER+]: Secondary | ICD-10-CM | POA: Insufficient documentation

## 2022-03-10 DIAGNOSIS — C50212 Malignant neoplasm of upper-inner quadrant of left female breast: Secondary | ICD-10-CM | POA: Insufficient documentation

## 2022-03-10 NOTE — Assessment & Plan Note (Signed)
12/16/2021:Screening mammogram detected left breast distortion and calcifications in the central upper quadrant.  Ultrasound at 11 o'clock position there was a 2 cm mass.  Axilla negative.  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  Left mastectomy 01/31/2022: Grade 2 IDC 6 cm, 1/1 lymph node positive, ER 95%, PR 100%, HER2 negative, Ki-67 10%, nipple biopsy: Positive 2. Oncotype DX 03/01/2022: Score: 15 (distant recurrence at 9 years: 14%) 3.  Additional surgery to remove nipple and other lymph nodes 4. Adjuvant radiation therapy followed by 5. Adjuvant antiestrogen therapy with tamoxifen (starting at 10 mg daily on 12/29/2021) 5.  Genetic testing ----------------------------------------------------------------------------------------------------------------------------- I discussed Oncotype test in great detail suggesting that if the lymph node involvement is less than 3 then she will need systemic chemotherapy.  We also discussed her case in the tumor board and the consensus was that she only had 1 lymph node removed which was negative.  Follow-up after the neck surgery to discuss the final pathology report.

## 2022-03-13 ENCOUNTER — Ambulatory Visit: Payer: 59

## 2022-03-13 ENCOUNTER — Encounter: Payer: Self-pay | Admitting: *Deleted

## 2022-03-13 DIAGNOSIS — Z483 Aftercare following surgery for neoplasm: Secondary | ICD-10-CM

## 2022-03-13 DIAGNOSIS — Z170 Estrogen receptor positive status [ER+]: Secondary | ICD-10-CM

## 2022-03-13 DIAGNOSIS — R293 Abnormal posture: Secondary | ICD-10-CM

## 2022-03-13 DIAGNOSIS — C50212 Malignant neoplasm of upper-inner quadrant of left female breast: Secondary | ICD-10-CM | POA: Diagnosis not present

## 2022-03-13 NOTE — Therapy (Signed)
OUTPATIENT PHYSICAL THERAPY BREAST CANCER TREATMENT   Patient Name: Samantha Pena MRN: [id number] DOB:03-Dec-1965, 56 y.o., female Today's Date: 03/13/2022   PT End of Session - 03/13/22 1500     Visit Number 5    Number of Visits 10    Date for PT Re-Evaluation 03/27/22    PT Start Time 1502    PT Stop Time 1572    PT Time Calculation (min) 53 min    Activity Tolerance Patient tolerated treatment well    Behavior During Therapy [facility] for tasks assessed/performed             Past Medical History:  Diagnosis Date   Anxiety    Cancer ([place])    COVID-19    2021   Cyst 01/2011   2016 stable. near rectum that gives pt discomfort when sitting and has grown in size in the past year    Migraines    Past Surgical History:  Procedure Laterality Date   BREAST RECONSTRUCTION WITH PLACEMENT OF TISSUE EXPANDER AND ALLODERM Left 01/31/2022   Procedure: BREAST RECONSTRUCTION WITH PLACEMENT OF TISSUE EXPANDER AND ALLODERM;  Surgeon: Irene Limbo, MD;  Location: [place];  Service: Plastics;  Laterality: Left;   CERVICAL POLYPECTOMY  02/21/2021   uterus   CESAREAN SECTION  03/18/1999   FOOT SURGERY Right 09/30/2002   bone in toe had to be shaved and realigned    LAPAROSCOPIC APPENDECTOMY N/A 10/08/2020   Procedure: APPENDECTOMY LAPAROSCOPIC;  Surgeon: Rolm Bookbinder, MD;  Location: [place];  Service: General;  Laterality: N/A;   NIPPLE SPARING MASTECTOMY WITH SENTINEL LYMPH NODE BIOPSY Left 01/31/2022   Procedure: LEFT NIPPLE SPARING MASTECTOMY WITH AXILLARY SENTINEL LYMPH NODE BIOPSY;  Surgeon: Rolm Bookbinder, MD;  Location: [place];  Service: General;  Laterality: Left;   Patient Active Problem List   Diagnosis Date Noted   S/P left mastectomy 01/31/2022   Malignant 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, estrogen receptor positive ([place]) 12/28/2021   S/P laparoscopic appendectomy 10/08/2020   History of Lyme disease 11/12/2018   Urge incontinence  11/12/2017   Overweight 05/07/2017   Allergic rhinitis 05/07/2017   Hyperlipidemia 10/31/2016   TMJ disease 03/17/2015   Anal benign neoplasm 02/14/2011   ACTINIC KERATOSIS, CHEEK, LEFT 11/13/2007   Migraine headache 10/30/2006    REFERRING PROVIDER: Dr. Rolm Bookbinder  REFERRING DIAG: Left breast cancer  THERAPY DIAG:  Malignant 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, estrogen receptor positive ([place])  Abnormal posture  Aftercare following surgery for neoplasm  Rationale for Evaluation and Treatment: Rehabilitation  ONSET DATE: 01/31/2022  SUBJECTIVE:  SUBJECTIVE STATEMENT: I am going to have to have another surgery but don't know the date yet. It is for LN and nipple on that side.I am awaiting a call from Dr. Donne Hazel. I only got the exercises in 1x per day over the weekend.  PERTINENT HISTORY:  Patient was diagnosed on 12/19/2021 with left grade 2 invasive ductal carcinoma breast cancer. She underwent a left mastectomy with immediate reconstruction with expanders placed and a sentinel node biopsy (1/1 node positive) on 01/31/2022. It is ER/PR positive and HER2 negative with a Ki67 of 10%.   PATIENT GOALS:  Reassess how my recovery is going related to arm function, pain, and swelling.  PAIN:  Are you having pain? No not today, just discomfort/soreness  PRECAUTIONS: Recent Surgery, left UE Lymphedema risk  ACTIVITY LEVEL / LEISURE: She is walking some but nothing strenuous.   OBJECTIVE:   PATIENT SURVEYS:  QUICK DASH:     OBSERVATIONS: Incision appears to be healing well with some scabby areas still present. Drain sites have healed nicely. Some mild edema present on left lateral chest. No cording present. Left scapula with little to no movement. Palpable tightness  present left rhomboid and upper trap region. Issued compression foam in soft stockinette for pt to apply to lateral trunk under bra to reduce pain. She reported relief.  POSTURE:  Forward head and rounded shoulders posture  LYMPHEDEMA ASSESSMENT:   UPPER EXTREMITY AROM/PROM:   A/PROM RIGHT   eval    Shoulder extension 53  Shoulder flexion 145  Shoulder abduction 146  Shoulder internal rotation 78  Shoulder external rotation 84                          (Blank rows = not tested)   A/PROM LEFT   eval LEFT 02/27/2022  Shoulder extension 53 40  Shoulder flexion 141 100  Shoulder abduction 160 94  Shoulder internal rotation 57 58  Shoulder external rotation 72 62                          (Blank rows = not tested)     CERVICAL AROM: All within normal limits   UPPER EXTREMITY STRENGTH: WNL     LYMPHEDEMA ASSESSMENTS:    LANDMARK RIGHT   eval RIGHT 02/27/2022  10 cm proximal to olecranon process 27.8 26.5  Olecranon process 25.3 24.4  10 cm proximal to ulnar styloid process 21.1 20.4  Just proximal to ulnar styloid process 16 15.7  Across hand at thumb web space 18.2 18.2  At base of 2nd digit 6.2 6.1  (Blank rows = not tested)   LANDMARK LEFT   eval LEFT 02/27/2022  10 cm proximal to olecranon process 28 27  Olecranon process _0 cm proximal to ulnar styloid process 20.7 20.5  Just proximal to ulnar styloid process 15.5 15.7  Across hand at thumb web space 17.5 17  At base of 2nd digit 5.9 6  (Blank rows = not tested)    Surgery type/Date: Left mastectomy with reconstruction and sentinel node biopsy 01/31/2022 Number of lymph nodes removed: 1 Current/past treatment (chemo, radiation, hormone therapy): none Other symptoms:  Heaviness/tightness Yes Pain Yes Pitting edema No Infections No Decreased scar mobility Yes Stemmer sign No  TREATMENT TODAY  03/13/2022 Pulleys x 2 min flex, 1 min scaption and  52mn abduction, ball rolls on wall  x 5 flexion  and abd Soft tissue  mobilization to left UT, pectorals, lateral trunk and SL to left UT and scapular area with cocoa butter. Instructed supine AA shoulder flexion and scaption with wand x 5 ea PROM left shoulder flexion, scaption, abd, IR and ER with VC's to relax MFR to left axillary area of cording AROM bilateral shoulder flexion, scaption, horizontal abduction   03/09/2022  Pulleys x 2 min flex, 1 min scaption and abduction Soft tissue mobilization to left UT, pectorals, lateral trunk and SL to left UT and scapular area with cocoa butter. Instructed supine AA shoulder flexion and scaption with wand x 5 ea PROM left shoulder flexion, scaption, abd, IR and ER with VC's to relax MFR to left axillary area of cording  03/06/2022 Incisions healing but with scabbing still present Soft tissue mobilization to left UT, pectorals, lateral trunk and SL to left UT and scapular area with cocoa butter. Reviewed supine AA shoulder flexion and stargazer x 5 ea  and LTR with arms outstretched x 5 all with VC's to relax PROM left shoulder flexion, scaption, abd, IR and ER with VC's to relax After dressing pt practiced scapular retraction and wall slides x 5 ea, VC's to relax UT with wall slides Several thin chords noted in axilla with one crossing elbow   PATIENT EDUCATION:  Education details: HEP - closed chain flexion and previously issued HEP Person educated: Patient Education method: Explanation, Demonstration, and Handouts Education comprehension: verbalized understanding and returned demonstration  HOME EXERCISE PROGRAM: Reviewed previously given post op HEP and gave closed chain shoulder flexion.  ASSESSMENT:  CLINICAL IMPRESSION: Pt had more difficulty relaxing today and requires intermittent VC's to relax, but by the end ROM is visibly improved. Pt complained of left scapular pain when she first got up from table. Improves with shaking it out. Pt is awaiting call from MD office to  schedule next surgery. Cording present but improved. perform skilled therapeutic intervention to improve on the following deficits: Decreased knowledge of precautions, impaired UE functional use, pain, decreased ROM, postural dysfunction.   PT treatment/interventions: ADL/Self care home management, Therapeutic exercises, Therapeutic activity, Patient/Family education, Self Care, Joint mobilization, Dry Needling, Spinal mobilization, Manual lymph drainage, scar mobilization, Manual therapy, and Re-evaluation   GOALS: Goals reviewed with patient? Yes  LONG TERM GOALS:  (STG=LTG)  GOALS Name Target Date  Goal status  1 Pt will demonstrate she has regained full shoulder ROM and function post operatively compared to baselines.  Baseline: 04/10/2022 IN PROGRESS  2 Patient will demonstrate she has increased left shoulder flexion to >/= 140 degrees to reach overhead. 04/10/2022 INITIAL  3 Patient will increase left shoulder abduction to >/= 140 degrees to achieve radiation positioning. 04/10/2022 INITIAL  4 Patient will improve her DASH score to be </= 8 for improved overall UE function. 04/10/2022 INITIAL  5 Patient will report pain </= 2/10 with most of her daily activities to allow for better sleep and tolerate normal tasks. 03/27/2022 INITIAL     PLAN:  PT FREQUENCY/DURATION: 2x/week for 4 weeks  PLAN FOR NEXT SESSION: PROM, AAROM exercises, consider wand, STM prn,MFR to axilla   [facility]  [address], [address]  [place] [address]  [phone]  After Breast Cancer Class It is recommended you attend the ABC class to be educated on lymphedema risk reduction. This class is free of charge and lasts for 1 hour. It is a 1-time class. You will need to download the Webex app either on your phone or computer. We  will send you a link the night before or the morning of the class. You should be able to click on that link to join the class. This is not a confidential  class. You don't have to turn your camera on, but other participants may be able to see your email address. You are scheduled for November 20th at 12:00.  Scar massage You can begin gentle scar massage to you incision sites. Gently place one hand on the incision and move the skin (without sliding on the skin) in various directions. Do this for a few minutes and then you can gently massage either coconut oil or vitamin E cream into the scars.  Compression garment You should continue wearing your compression bra until you feel like you no longer have swelling.  Home exercise Program Continue doing the exercises you were given until you feel like you can do them without feeling any tightness at the end.   Walking Program Studies show that 30 minutes of walking per day (fast enough to elevate your heart rate) can significantly reduce the risk of a cancer recurrence. If you can't walk due to other medical reasons, we encourage you to find another activity you could do (like a stationary bike or water exercise).  Posture After breast cancer surgery, people frequently sit with rounded shoulders posture because it puts their incisions on slack and feels better. If you sit like this and scar tissue forms in that position, you can become very tight and have pain sitting or standing with good posture. Try to be aware of your posture and sit and stand up tall to heal properly.  Follow up PT: It is recommended you return every 3 months for the first 3 years following surgery to be assessed on the SOZO machine for an L-Dex score. This helps prevent clinically significant lymphedema in 95% of patients. These follow up screens are 10 minute appointments that you are not billed for.  Annia Friendly, [place] 03/13/22 4:00 PM

## 2022-03-14 ENCOUNTER — Telehealth: Payer: Self-pay | Admitting: *Deleted

## 2022-03-14 NOTE — Telephone Encounter (Signed)
Spoke with pathology and requested a review for number of LN's + clarification.

## 2022-03-15 ENCOUNTER — Encounter: Payer: Self-pay | Admitting: *Deleted

## 2022-03-16 ENCOUNTER — Ambulatory Visit: Payer: 59

## 2022-03-16 ENCOUNTER — Encounter: Payer: Self-pay | Admitting: *Deleted

## 2022-03-16 DIAGNOSIS — C50212 Malignant neoplasm of upper-inner quadrant of left female breast: Secondary | ICD-10-CM | POA: Diagnosis not present

## 2022-03-16 DIAGNOSIS — Z170 Estrogen receptor positive status [ER+]: Secondary | ICD-10-CM

## 2022-03-16 DIAGNOSIS — R293 Abnormal posture: Secondary | ICD-10-CM

## 2022-03-16 DIAGNOSIS — Z483 Aftercare following surgery for neoplasm: Secondary | ICD-10-CM

## 2022-03-16 NOTE — Therapy (Signed)
OUTPATIENT PHYSICAL THERAPY BREAST CANCER TREATMENT   Patient Name: Samantha Pena MRN: [id number] DOB:1965/11/01, 56 y.o., female Today's Date: 03/16/2022   PT End of Session - 03/16/22 1358     Visit Number 6    Number of Visits 10    Date for PT Re-Evaluation 03/27/22    PT Start Time 1400    PT Stop Time 7943    PT Time Calculation (min) 57 min    Activity Tolerance Patient tolerated treatment well    Behavior During Therapy [facility] for tasks assessed/performed             Past Medical History:  Diagnosis Date   Anxiety    Cancer ([place])    COVID-19    2021   Cyst 01/2011   2016 stable. near rectum that gives pt discomfort when sitting and has grown in size in the past year    Migraines    Past Surgical History:  Procedure Laterality Date   BREAST RECONSTRUCTION WITH PLACEMENT OF TISSUE EXPANDER AND ALLODERM Left 01/31/2022   Procedure: BREAST RECONSTRUCTION WITH PLACEMENT OF TISSUE EXPANDER AND ALLODERM;  Surgeon: Irene Limbo, MD;  Location: [place];  Service: Plastics;  Laterality: Left;   CERVICAL POLYPECTOMY  02/21/2021   uterus   CESAREAN SECTION  03/18/1999   FOOT SURGERY Right 09/30/2002   bone in toe had to be shaved and realigned    LAPAROSCOPIC APPENDECTOMY N/A 10/08/2020   Procedure: APPENDECTOMY LAPAROSCOPIC;  Surgeon: Rolm Bookbinder, MD;  Location: [place];  Service: General;  Laterality: N/A;   NIPPLE SPARING MASTECTOMY WITH SENTINEL LYMPH NODE BIOPSY Left 01/31/2022   Procedure: LEFT NIPPLE SPARING MASTECTOMY WITH AXILLARY SENTINEL LYMPH NODE BIOPSY;  Surgeon: Rolm Bookbinder, MD;  Location: [place];  Service: General;  Laterality: Left;   Patient Active Problem List   Diagnosis Date Noted   S/P left mastectomy 01/31/2022   Malignant 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, estrogen receptor positive ([place]) 12/28/2021   S/P laparoscopic appendectomy 10/08/2020   History of Lyme disease 11/12/2018   Urge incontinence  11/12/2017   Overweight 05/07/2017   Allergic rhinitis 05/07/2017   Hyperlipidemia 10/31/2016   TMJ disease 03/17/2015   Anal benign neoplasm 02/14/2011   ACTINIC KERATOSIS, CHEEK, LEFT 11/13/2007   Migraine headache 10/30/2006    REFERRING PROVIDER: Dr. Rolm Bookbinder  REFERRING DIAG: Left breast cancer  THERAPY DIAG:  Malignant 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, estrogen receptor positive ([place])  Abnormal posture  Aftercare following surgery for neoplasm  Rationale for Evaluation and Treatment: Rehabilitation  ONSET DATE: 01/31/2022  SUBJECTIVE:  SUBJECTIVE STATEMENT: My other surgery will be after Thanksgiving. I am not going to have any more LN's removed, but they will remove the nipple. I got a red rash under my breast wit the coconut oil so now I am using Aloe and its better.  PERTINENT HISTORY:  Patient was diagnosed on 12/19/2021 with left grade 2 invasive ductal carcinoma breast cancer. She underwent a left mastectomy with immediate reconstruction with expanders placed and a sentinel node biopsy (1/1 node positive) on 01/31/2022. It is ER/PR positive and HER2 negative with a Ki67 of 10%.   PATIENT GOALS:  Reassess how my recovery is going related to arm function, pain, and swelling.  PAIN:  Are you having pain? No not today, just discomfort/soreness  PRECAUTIONS: Recent Surgery, left UE Lymphedema risk  ACTIVITY LEVEL / LEISURE: She is walking some but nothing strenuous.   OBJECTIVE:   PATIENT SURVEYS:  QUICK DASH:     OBSERVATIONS: Incision appears to be healing well with some scabby areas still present. Drain sites have healed nicely. Some mild edema present on left lateral chest. No cording present. Left scapula with little to no movement. Palpable  tightness present left rhomboid and upper trap region. Issued compression foam in soft stockinette for pt to apply to lateral trunk under bra to reduce pain. She reported relief.  POSTURE:  Forward head and rounded shoulders posture  LYMPHEDEMA ASSESSMENT:   UPPER EXTREMITY AROM/PROM:   A/PROM RIGHT   eval    Shoulder extension 53  Shoulder flexion 145  Shoulder abduction 146  Shoulder internal rotation 78  Shoulder external rotation 84                          (Blank rows = not tested)   A/PROM LEFT   eval LEFT 02/27/2022 LEFT 03/16/2022  Shoulder extension 53 40 47  Shoulder flexion 141 100 151  Shoulder abduction 160 94 135  Shoulder internal rotation 57 58 75  Shoulder external rotation 72 62 90                          (Blank rows = not tested)     CERVICAL AROM: All within normal limits   UPPER EXTREMITY STRENGTH: WNL     LYMPHEDEMA ASSESSMENTS:    LANDMARK RIGHT   eval RIGHT 02/27/2022  10 cm proximal to olecranon process 27.8 26.5  Olecranon process 25.3 24.4  10 cm proximal to ulnar styloid process 21.1 20.4  Just proximal to ulnar styloid process 16 15.7  Across hand at thumb web space 18.2 18.2  At base of 2nd digit 6.2 6.1  (Blank rows = not tested)   LANDMARK LEFT   eval LEFT 02/27/2022  10 cm proximal to olecranon process 28 27  Olecranon process _0 cm proximal to ulnar styloid process 20.7 20.5  Just proximal to ulnar styloid process 15.5 15.7  Across hand at thumb web space 17.5 17  At base of 2nd digit 5.9 6  (Blank rows = not tested)    Surgery type/Date: Left mastectomy with reconstruction and sentinel node biopsy 01/31/2022 Number of lymph nodes removed: 1 Current/past treatment (chemo, radiation, hormone therapy): none Other symptoms:  Heaviness/tightness Yes Pain Yes Pitting edema No Infections No Decreased scar mobility Yes Stemmer sign No  TREATMENT TODAY 03/16/2022 Assessed incision; mild redness remains but  does not appear infected. Appears like a resolving rash around lower  breast incision Pulleys x 2 min flex, 1 min scaption and  32mn abduction,  Soft tissue mobilization to left UT, pectorals, lateral trunk and SL to left UT and scapular area with cocoa butter. Instructed supine AA shoulder flexion and scaption with wand x 3 ea PROM left shoulder flexion, scaption, abd, IR and ER with VC's to relax MFR to left axillary area of cording Measured ROM AROM standing bilateral flexion and scaption x 6 ea   03/13/2022 Pulleys x 2 min flex, 1 min scaption and  265m abduction, ball rolls on wall  x 5 flexion and abd Soft tissue mobilization to left UT, pectorals, lateral trunk and SL to left UT and scapular area with cocoa butter. Instructed supine AA shoulder flexion and scaption with wand x 5 ea PROM left shoulder flexion, scaption, abd, IR and ER with VC's to relax MFR to left axillary area of cording AROM bilateral shoulder flexion, scaption, horizontal abduction   03/09/2022  Pulleys x 2 min flex, 1 min scaption and abduction Soft tissue mobilization to left UT, pectorals, lateral trunk and SL to left UT and scapular area with cocoa butter. Instructed supine AA shoulder flexion and scaption with wand x 5 ea PROM left shoulder flexion, scaption, abd, IR and ER with VC's to relax MFR to left axillary area of cording  03/06/2022 Incisions healing but with scabbing still present Soft tissue mobilization to left UT, pectorals, lateral trunk and SL to left UT and scapular area with cocoa butter. Reviewed supine AA shoulder flexion and stargazer x 5 ea  and LTR with arms outstretched x 5 all with VC's to relax PROM left shoulder flexion, scaption, abd, IR and ER with VC's to relax After dressing pt practiced scapular retraction and wall slides x 5 ea, VC's to relax UT with wall slides Several thin chords noted in axilla with one crossing elbow   PATIENT EDUCATION:  Education details: HEP -  closed chain flexion and previously issued HEP Person educated: Patient Education method: Explanation, Demonstration, and Handouts Education comprehension: verbalized understanding and returned demonstration  HOME EXERCISE PROGRAM: Reviewed previously given post op HEP and gave closed chain shoulder flexion.  ASSESSMENT:  CLINICAL IMPRESSION: Pt has decided not to have further LN's removed but will have the nipple removed sometime after Thanksgiving. Slight rash that is improving under the left breast. Nice improvement in AROM noted today with measuring. Encouraged pt to do her exercises 2x's per day perform skilled therapeutic intervention to improve on the following deficits: Decreased knowledge of precautions, impaired UE functional use, pain, decreased ROM, postural dysfunction.   PT treatment/interventions: ADL/Self care home management, Therapeutic exercises, Therapeutic activity, Patient/Family education, Self Care, Joint mobilization, Dry Needling, Spinal mobilization, Manual lymph drainage, scar mobilization, Manual therapy, and Re-evaluation   GOALS: Goals reviewed with patient? Yes  LONG TERM GOALS:  (STG=LTG)  GOALS Name Target Date  Goal status  1 Pt will demonstrate she has regained full shoulder ROM and function post operatively compared to baselines.  Baseline: 04/13/2022 INITIAL  2 Patient will demonstrate she has increased left shoulder flexion to >/= 140 degrees to reach overhead. 04/13/2022 MET  03/16/2022  3 Patient will increase left shoulder abduction to >/= 140 degrees to achieve radiation positioning. 04/13/2022 INITIAL  4 Patient will improve her DASH score to be </= 8 for improved overall UE function. 04/13/2022 INITIAL  5 Patient will report pain </= 2/10 with most of her daily activities to allow for better sleep and tolerate normal tasks.  03/27/2022 INITIAL     PLAN:  PT FREQUENCY/DURATION: 2x/week for 4 weeks  PLAN FOR NEXT SESSION: PROM, AAROM  exercises, consider wand, STM prn,MFR to axilla   [facility]  [address], [address]  [place] [address]  [phone]  After Breast Cancer Class It is recommended you attend the ABC class to be educated on lymphedema risk reduction. This class is free of charge and lasts for 1 hour. It is a 1-time class. You will need to download the Webex app either on your phone or computer. We will send you a link the night before or the morning of the class. You should be able to click on that link to join the class. This is not a confidential class. You don't have to turn your camera on, but other participants may be able to see your email address. You are scheduled for November 20th at 12:00.  Scar massage You can begin gentle scar massage to you incision sites. Gently place one hand on the incision and move the skin (without sliding on the skin) in various directions. Do this for a few minutes and then you can gently massage either coconut oil or vitamin E cream into the scars.  Compression garment You should continue wearing your compression bra until you feel like you no longer have swelling.  Home exercise Program Continue doing the exercises you were given until you feel like you can do them without feeling any tightness at the end.   Walking Program Studies show that 30 minutes of walking per day (fast enough to elevate your heart rate) can significantly reduce the risk of a cancer recurrence. If you can't walk due to other medical reasons, we encourage you to find another activity you could do (like a stationary bike or water exercise).  Posture After breast cancer surgery, people frequently sit with rounded shoulders posture because it puts their incisions on slack and feels better. If you sit like this and scar tissue forms in that position, you can become very tight and have pain sitting or standing with good posture. Try to be aware of your posture and sit and stand  up tall to heal properly.  Follow up PT: It is recommended you return every 3 months for the first 3 years following surgery to be assessed on the SOZO machine for an L-Dex score. This helps prevent clinically significant lymphedema in 95% of patients. These follow up screens are 10 minute appointments that you are not billed for.  Annia Friendly, [place] 03/16/22 2:59 PM

## 2022-03-20 ENCOUNTER — Ambulatory Visit: Payer: 59

## 2022-03-20 ENCOUNTER — Encounter: Payer: Self-pay | Admitting: *Deleted

## 2022-03-20 DIAGNOSIS — Z170 Estrogen receptor positive status [ER+]: Secondary | ICD-10-CM

## 2022-03-20 DIAGNOSIS — Z483 Aftercare following surgery for neoplasm: Secondary | ICD-10-CM

## 2022-03-20 DIAGNOSIS — C50212 Malignant neoplasm of upper-inner quadrant of left female breast: Secondary | ICD-10-CM | POA: Diagnosis not present

## 2022-03-20 DIAGNOSIS — R293 Abnormal posture: Secondary | ICD-10-CM

## 2022-03-20 NOTE — Therapy (Signed)
OUTPATIENT PHYSICAL THERAPY BREAST CANCER TREATMENT   Patient Name: Samantha Pena MRN: [id number] DOB:1965-09-09, 56 y.o., female Today's Date: 03/20/2022   PT End of Session - 03/20/22 1356     Visit Number 7    Number of Visits 10    Date for PT Re-Evaluation 03/27/22    PT Start Time 8333    PT Stop Time 8329    PT Time Calculation (min) 59 min    Activity Tolerance Patient tolerated treatment well    Behavior During Therapy [facility] for tasks assessed/performed             Past Medical History:  Diagnosis Date   Anxiety    Cancer ([place])    COVID-19    2021   Cyst 01/2011   2016 stable. near rectum that gives pt discomfort when sitting and has grown in size in the past year    Migraines    Past Surgical History:  Procedure Laterality Date   BREAST RECONSTRUCTION WITH PLACEMENT OF TISSUE EXPANDER AND ALLODERM Left 01/31/2022   Procedure: BREAST RECONSTRUCTION WITH PLACEMENT OF TISSUE EXPANDER AND ALLODERM;  Surgeon: Irene Limbo, MD;  Location: [place];  Service: Plastics;  Laterality: Left;   CERVICAL POLYPECTOMY  02/21/2021   uterus   CESAREAN SECTION  03/18/1999   FOOT SURGERY Right 09/30/2002   bone in toe had to be shaved and realigned    LAPAROSCOPIC APPENDECTOMY N/A 10/08/2020   Procedure: APPENDECTOMY LAPAROSCOPIC;  Surgeon: Rolm Bookbinder, MD;  Location: [place];  Service: General;  Laterality: N/A;   NIPPLE SPARING MASTECTOMY WITH SENTINEL LYMPH NODE BIOPSY Left 01/31/2022   Procedure: LEFT NIPPLE SPARING MASTECTOMY WITH AXILLARY SENTINEL LYMPH NODE BIOPSY;  Surgeon: Rolm Bookbinder, MD;  Location: [place];  Service: General;  Laterality: Left;   Patient Active Problem List   Diagnosis Date Noted   S/P left mastectomy 01/31/2022   Malignant 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, estrogen receptor positive ([place]) 12/28/2021   S/P laparoscopic appendectomy 10/08/2020   History of Lyme disease 11/12/2018   Urge incontinence  11/12/2017   Overweight 05/07/2017   Allergic rhinitis 05/07/2017   Hyperlipidemia 10/31/2016   TMJ disease 03/17/2015   Anal benign neoplasm 02/14/2011   ACTINIC KERATOSIS, CHEEK, LEFT 11/13/2007   Migraine headache 10/30/2006    REFERRING PROVIDER: Dr. Rolm Bookbinder  REFERRING DIAG: Left breast cancer  THERAPY DIAG:  Malignant 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, estrogen receptor positive ([place])  Abnormal posture  Aftercare following surgery for neoplasm  Rationale for Evaluation and Treatment: Rehabilitation  ONSET DATE: 01/31/2022  SUBJECTIVE:  SUBJECTIVE STATEMENT: The rash under my breast is doing better. My other surgery is scheduled for Dec. 7, 2023  PERTINENT HISTORY:  Patient was diagnosed on 12/19/2021 with left grade 2 invasive ductal carcinoma breast cancer. She underwent a left mastectomy with immediate reconstruction with expanders placed and a sentinel node biopsy (1/1 node positive) on 01/31/2022. It is ER/PR positive and HER2 negative with a Ki67 of 10%.   PATIENT GOALS:  Reassess how my recovery is going related to arm function, pain, and swelling.  PAIN:  Are you having pain? No not today, just discomfort/soreness  PRECAUTIONS: Recent Surgery, left UE Lymphedema risk  ACTIVITY LEVEL / LEISURE: She is walking some but nothing strenuous.   OBJECTIVE:   PATIENT SURVEYS:  QUICK DASH:     OBSERVATIONS: Incision appears to be healing well with some scabby areas still present. Drain sites have healed nicely. Some mild edema present on left lateral chest. No cording present. Left scapula with little to no movement. Palpable tightness present left rhomboid and upper trap region. Issued compression foam in soft stockinette for pt to apply to lateral  trunk under bra to reduce pain. She reported relief.  POSTURE:  Forward head and rounded shoulders posture  LYMPHEDEMA ASSESSMENT:   UPPER EXTREMITY AROM/PROM:   A/PROM RIGHT   eval    Shoulder extension 53  Shoulder flexion 145  Shoulder abduction 146  Shoulder internal rotation 78  Shoulder external rotation 84                          (Blank rows = not tested)   A/PROM LEFT   eval LEFT 02/27/2022 LEFT 03/16/2022  Shoulder extension 53 40 47  Shoulder flexion 141 100 151  Shoulder abduction 160 94 135  Shoulder internal rotation 57 58 75  Shoulder external rotation 72 62 90                          (Blank rows = not tested)     CERVICAL AROM: All within normal limits   UPPER EXTREMITY STRENGTH: WNL     LYMPHEDEMA ASSESSMENTS:    LANDMARK RIGHT   eval RIGHT 02/27/2022  10 cm proximal to olecranon process 27.8 26.5  Olecranon process 25.3 24.4  10 cm proximal to ulnar styloid process 21.1 20.4  Just proximal to ulnar styloid process 16 15.7  Across hand at thumb web space 18.2 18.2  At base of 2nd digit 6.2 6.1  (Blank rows = not tested)   LANDMARK LEFT   eval LEFT 02/27/2022  10 cm proximal to olecranon process 28 27  Olecranon process _0 cm proximal to ulnar styloid process 20.7 20.5  Just proximal to ulnar styloid process 15.5 15.7  Across hand at thumb web space 17.5 17  At base of 2nd digit 5.9 6  (Blank rows = not tested)    Surgery type/Date: Left mastectomy with reconstruction and sentinel node biopsy 01/31/2022 Number of lymph nodes removed: 1 Current/past treatment (chemo, radiation, hormone therapy): none Other symptoms:  Heaviness/tightness Yes Pain Yes Pitting edema No Infections No Decreased scar mobility Yes Stemmer sign No  TREATMENT TODAY 03/20/2022 Soft tissue mobilization to left UT, pectorals, lateral trunk and SL to left UT and scapular area with cocoa butter. supine AA shoulder flexion x 3 and scaption with wand x  3 narrower and wider grip ea, 10 sec hold PROM left shoulder flexion,  scaption, abd, IR and ER with VC's to relax AROM supine bilateral shoulder flexion, scaption, abduction, horizontal abd x 5 Scapular Retraction with Resistance  - yellow x 10  - Scapular Retraction with Resistance Advanced  - 10 reps - Shoulder External Rotation and Scapular Retraction with Resistance  - 10 reps - Corner Pec Major Stretch  - 2 x daily - 7 x weekly - 1 sets - 3 reps - 15-30 hold - Single Arm Doorway Pec Stretch at 90 Degrees Abduction  - 2 x daily - 7 x week Updated HEP 03/16/2022 Assessed incision; mild redness remains but does not appear infected. Appears like a resolving rash around lower breast incision Pulleys x 2 min flex, 1 min scaption and  6mn abduction,  Soft tissue mobilization to left UT, pectorals, lateral trunk and SL to left UT and scapular area with cocoa butter. Instructed supine AA shoulder flexion and scaption with wand x 3 ea PROM left shoulder flexion, scaption, abd, IR and ER with VC's to relax MFR to left axillary area of cording Measured ROM AROM standing bilateral flexion and scaption x 6 ea   03/13/2022 Pulleys x 2 min flex, 1 min scaption and  266m abduction, ball rolls on wall  x 5 flexion and abd Soft tissue mobilization to left UT, pectorals, lateral trunk and SL to left UT and scapular area with cocoa butter. Instructed supine AA shoulder flexion and scaption with wand x 5 ea PROM left shoulder flexion, scaption, abd, IR and ER with VC's to relax MFR to left axillary area of cording AROM bilateral shoulder flexion, scaption, horizontal abduction   03/09/2022  Pulleys x 2 min flex, 1 min scaption and abduction Soft tissue mobilization to left UT, pectorals, lateral trunk and SL to left UT and scapular area with cocoa butter. Instructed supine AA shoulder flexion and scaption with wand x 5 ea PROM left shoulder flexion, scaption, abd, IR and ER with VC's to relax MFR  to left axillary area of cording  03/06/2022 Incisions healing but with scabbing still present Soft tissue mobilization to left UT, pectorals, lateral trunk and SL to left UT and scapular area with cocoa butter. Reviewed supine AA shoulder flexion and stargazer x 5 ea  and LTR with arms outstretched x 5 all with VC's to relax PROM left shoulder flexion, scaption, abd, IR and ER with VC's to relax After dressing pt practiced scapular retraction and wall slides x 5 ea, VC's to relax UT with wall slides Several thin chords noted in axilla with one crossing elbow   PATIENT EDUCATION:  Education details: HEP - closed chain flexion and previously issued HEPAccess Code: [email or web address] URL: [email or web address] Date: 03/20/2022 Prepared by: RoCheral AlmasExercises - Scapular Retraction with Resistance  - 1 x daily - 3 x weekly - 1 sets - 10 reps - Scapular Retraction with Resistance Advanced  - 1 x daily - 3 x weekly - 1 sets - 10 reps - Shoulder External Rotation and Scapular Retraction with Resistance  - 1 x daily - 3 x weekly - 1 sets - 10 reps - Corner Pec Major Stretch  - 2 x daily - 7 x weekly - 1 sets - 3 reps - 15-30 hold - Single Arm Doorway Pec Stretch at 90 Degrees Abduction  - 2 x daily - 7 x weekly - 1 sets - 3 reps - 15-30 hold, Person educated: Patient Education method: Explanation, Demonstration, and Handouts Education comprehension: verbalized understanding and returned demonstration  HOME  EXERCISE PROGRAM: Reviewed previously given post op HEP and gave closed chain shoulder flexion.  ASSESSMENT:  CLINICAL IMPRESSION: Pt was very fatigued in both UE's after supine AROM exercises. She did very well with theraband and stretches and TB added to HEP. Tightness still noted especially in left pectorals. perform skilled therapeutic intervention to improve on the following deficits: Decreased knowledge of precautions, impaired UE functional use, pain, decreased ROM,  postural dysfunction.   PT treatment/interventions: ADL/Self care home management, Therapeutic exercises, Therapeutic activity, Patient/Family education, Self Care, Joint mobilization, Dry Needling, Spinal mobilization, Manual lymph drainage, scar mobilization, Manual therapy, and Re-evaluation   GOALS: Goals reviewed with patient? Yes  LONG TERM GOALS:  (STG=LTG)  GOALS Name Target Date  Goal status  1 Pt will demonstrate she has regained full shoulder ROM and function post operatively compared to baselines.  Baseline: 04/17/2022 INITIAL  2 Patient will demonstrate she has increased left shoulder flexion to >/= 140 degrees to reach overhead. 04/17/2022 MET  03/16/2022  3 Patient will increase left shoulder abduction to >/= 140 degrees to achieve radiation positioning. 04/17/2022 INITIAL  4 Patient will improve her DASH score to be </= 8 for improved overall UE function. 04/17/2022 INITIAL  5 Patient will report pain </= 2/10 with most of her daily activities to allow for better sleep and tolerate normal tasks. 03/27/2022 INITIAL     PLAN:  PT FREQUENCY/DURATION: 2x/week for 4 weeks  PLAN FOR NEXT SESSION: PROM, AAROM exercises, consider wand, STM prn,MFR to axilla   [facility]  [address], [address]  [place] [address]  [phone]  After Breast Cancer Class It is recommended you attend the ABC class to be educated on lymphedema risk reduction. This class is free of charge and lasts for 1 hour. It is a 1-time class. You will need to download the Webex app either on your phone or computer. We will send you a link the night before or the morning of the class. You should be able to click on that link to join the class. This is not a confidential class. You don't have to turn your camera on, but other participants may be able to see your email address. You are scheduled for November 20th at 12:00.  Scar massage You can begin gentle scar massage to you  incision sites. Gently place one hand on the incision and move the skin (without sliding on the skin) in various directions. Do this for a few minutes and then you can gently massage either coconut oil or vitamin E cream into the scars.  Compression garment You should continue wearing your compression bra until you feel like you no longer have swelling.  Home exercise Program Continue doing the exercises you were given until you feel like you can do them without feeling any tightness at the end.   Walking Program Studies show that 30 minutes of walking per day (fast enough to elevate your heart rate) can significantly reduce the risk of a cancer recurrence. If you can't walk due to other medical reasons, we encourage you to find another activity you could do (like a stationary bike or water exercise).  Posture After breast cancer surgery, people frequently sit with rounded shoulders posture because it puts their incisions on slack and feels better. If you sit like this and scar tissue forms in that position, you can become very tight and have pain sitting or standing with good posture. Try to be aware of your posture and  sit and stand up tall to heal properly.  Follow up PT: It is recommended you return every 3 months for the first 3 years following surgery to be assessed on the SOZO machine for an L-Dex score. This helps prevent clinically significant lymphedema in 95% of patients. These follow up screens are 10 minute appointments that you are not billed for.  Annia Friendly, [place] 03/20/22 3:00 PM

## 2022-03-21 ENCOUNTER — Telehealth: Payer: Self-pay | Admitting: Radiation Oncology

## 2022-03-21 ENCOUNTER — Ambulatory Visit: Payer: 59

## 2022-03-21 ENCOUNTER — Other Ambulatory Visit: Payer: Self-pay | Admitting: Hematology and Oncology

## 2022-03-21 DIAGNOSIS — Z483 Aftercare following surgery for neoplasm: Secondary | ICD-10-CM

## 2022-03-21 DIAGNOSIS — C50212 Malignant neoplasm of upper-inner quadrant of left female breast: Secondary | ICD-10-CM | POA: Diagnosis not present

## 2022-03-21 DIAGNOSIS — Z170 Estrogen receptor positive status [ER+]: Secondary | ICD-10-CM

## 2022-03-21 DIAGNOSIS — R293 Abnormal posture: Secondary | ICD-10-CM

## 2022-03-21 NOTE — Telephone Encounter (Signed)
Left message to call back to schedule appointment per 11/20 referral.

## 2022-03-21 NOTE — Therapy (Signed)
OUTPATIENT PHYSICAL THERAPY BREAST CANCER TREATMENT   Patient Name: Samantha Pena MRN: [id number] DOB:[birth year], 56 y.o., female Today's Date: 03/21/2022   PT End of Session - 03/21/22 1257     Visit Number 8    Number of Visits 10    Date for PT Re-Evaluation 03/27/22    PT Start Time 1202    PT Stop Time 8756    PT Time Calculation (min) 51 min    Activity Tolerance Patient tolerated treatment well    Behavior During Therapy [facility] for tasks assessed/performed             Past Medical History:  Diagnosis Date   Anxiety    Cancer ([place])    COVID-19    2021   Cyst 01/2011   2016 stable. near rectum that gives pt discomfort when sitting and has grown in size in the past year    Migraines    Past Surgical History:  Procedure Laterality Date   BREAST RECONSTRUCTION WITH PLACEMENT OF TISSUE EXPANDER AND ALLODERM Left 01/31/2022   Procedure: BREAST RECONSTRUCTION WITH PLACEMENT OF TISSUE EXPANDER AND ALLODERM;  Surgeon: Irene Limbo, MD;  Location: [place];  Service: Plastics;  Laterality: Left;   CERVICAL POLYPECTOMY  02/21/2021   uterus   CESAREAN SECTION  03/18/1999   FOOT SURGERY Right 09/30/2002   bone in toe had to be shaved and realigned    LAPAROSCOPIC APPENDECTOMY N/A 10/08/2020   Procedure: APPENDECTOMY LAPAROSCOPIC;  Surgeon: Rolm Bookbinder, MD;  Location: [place];  Service: General;  Laterality: N/A;   NIPPLE SPARING MASTECTOMY WITH SENTINEL LYMPH NODE BIOPSY Left 01/31/2022   Procedure: LEFT NIPPLE SPARING MASTECTOMY WITH AXILLARY SENTINEL LYMPH NODE BIOPSY;  Surgeon: Rolm Bookbinder, MD;  Location: [place];  Service: General;  Laterality: Left;   Patient Active Problem List   Diagnosis Date Noted   S/P left mastectomy 01/31/2022   Malignant 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, estrogen receptor positive ([place]) 12/28/2021   S/P laparoscopic appendectomy 10/08/2020   History of Lyme disease 11/12/2018   Urge incontinence  11/12/2017   Overweight 05/07/2017   Allergic rhinitis 05/07/2017   Hyperlipidemia 10/31/2016   TMJ disease 03/17/2015   Anal benign neoplasm 02/14/2011   ACTINIC KERATOSIS, CHEEK, LEFT 11/13/2007   Migraine headache 10/30/2006    REFERRING PROVIDER: Dr. Rolm Bookbinder  REFERRING DIAG: Left breast cancer  THERAPY DIAG:  Malignant 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, estrogen receptor positive ([place])  Abnormal posture  Aftercare following surgery for neoplasm  Rationale for Evaluation and Treatment: Rehabilitation  ONSET DATE: 01/31/2022  SUBJECTIVE:  SUBJECTIVE STATEMENT: Did fine after yesterdays visit. Still have the most trouble with the wall slide and reaching out to the side.  PERTINENT HISTORY:  Patient was diagnosed on 12/19/2021 with left grade 2 invasive ductal carcinoma breast cancer. She underwent a left mastectomy with immediate reconstruction with expanders placed and a sentinel node biopsy (1/1 node positive) on 01/31/2022. It is ER/PR positive and HER2 negative with a Ki67 of 10%.   PATIENT GOALS:  Reassess how my recovery is going related to arm function, pain, and swelling.  PAIN:  Are you having pain? No not today, just discomfort/soreness  PRECAUTIONS: Recent Surgery, left UE Lymphedema risk  ACTIVITY LEVEL / LEISURE: She is walking some but nothing strenuous.   OBJECTIVE:   PATIENT SURVEYS:  QUICK DASH:     OBSERVATIONS: Incision appears to be healing well with some scabby areas still present. Drain sites have healed nicely. Some mild edema present on left lateral chest. No cording present. Left scapula with little to no movement. Palpable tightness present left rhomboid and upper trap region. Issued compression foam in soft stockinette for pt  to apply to lateral trunk under bra to reduce pain. She reported relief.  POSTURE:  Forward head and rounded shoulders posture  LYMPHEDEMA ASSESSMENT:   UPPER EXTREMITY AROM/PROM:   A/PROM RIGHT   eval    Shoulder extension 53  Shoulder flexion 145  Shoulder abduction 146  Shoulder internal rotation 78  Shoulder external rotation 84                          (Blank rows = not tested)   A/PROM LEFT   eval LEFT 02/27/2022 LEFT 03/16/2022  Shoulder extension 53 40 47  Shoulder flexion 141 100 151  Shoulder abduction 160 94 135  Shoulder internal rotation 57 58 75  Shoulder external rotation 72 62 90                          (Blank rows = not tested)     CERVICAL AROM: All within normal limits   UPPER EXTREMITY STRENGTH: WNL     LYMPHEDEMA ASSESSMENTS:    LANDMARK RIGHT   eval RIGHT 02/27/2022  10 cm proximal to olecranon process 27.8 26.5  Olecranon process 25.3 24.4  10 cm proximal to ulnar styloid process 21.1 20.4  Just proximal to ulnar styloid process 16 15.7  Across hand at thumb web space 18.2 18.2  At base of 2nd digit 6.2 6.1  (Blank rows = not tested)   LANDMARK LEFT   eval LEFT 02/27/2022  10 cm proximal to olecranon process 28 27  Olecranon process _0 cm proximal to ulnar styloid process 20.7 20.5  Just proximal to ulnar styloid process 15.5 15.7  Across hand at thumb web space 17.5 17  At base of 2nd digit 5.9 6  (Blank rows = not tested)    Surgery type/Date: Left mastectomy with reconstruction and sentinel node biopsy 01/31/2022 Number of lymph nodes removed: 1 Current/past treatment (chemo, radiation, hormone therapy): none Other symptoms:  Heaviness/tightness Yes Pain Yes Pitting edema No Infections No Decreased scar mobility Yes Stemmer sign No  TREATMENT TODAY  03/21/2022 Wall slides x 7, Pec wall stretch single arm x 3, Lat stretch x4 standing Soft tissue mobilization to left UT, pectorals, lateral trunk and SL to left  UT and scapular area with cocoa butter. PROM left shoulder flexion, scaption,  abd, IR and ER with VC's to relax  Left scapular mobs in right SL, protraction retraction, elevation depression Snow angels 2 x 5  Supine scapular series: flexion and ER yellow x 10 Standing shoulder flexion and abduction x 10, reach to high shelf with 1# x 5 03/20/2022 Soft tissue mobilization to left UT, pectorals, lateral trunk and SL to left UT and scapular area with cocoa butter. supine AA shoulder flexion x 3 and scaption with wand x 3 narrower and wider grip ea, 10 sec hold PROM left shoulder flexion, scaption, abd, IR and ER with VC's to relax AROM supine bilateral shoulder flexion, scaption, abduction, horizontal abd x 5 Scapular Retraction with Resistance  - yellow x 10  - Scapular Retraction with Resistance Advanced  - 10 reps - Shoulder External Rotation and Scapular Retraction with Resistance  - 10 reps - Corner Pec Major Stretch  - 2 x daily - 7 x weekly - 1 sets - 3 reps - 15-30 hold - Single Arm Doorway Pec Stretch at 90 Degrees Abduction  - 2 x daily - 7 x week Updated HEP 03/16/2022 Assessed incision; mild redness remains but does not appear infected. Appears like a resolving rash around lower breast incision Pulleys x 2 min flex, 1 min scaption and  69mn abduction,  Soft tissue mobilization to left UT, pectorals, lateral trunk and SL to left UT and scapular area with cocoa butter. Instructed supine AA shoulder flexion and scaption with wand x 3 ea PROM left shoulder flexion, scaption, abd, IR and ER with VC's to relax MFR to left axillary area of cording Measured ROM AROM standing bilateral flexion and scaption x 6 ea   03/13/2022 Pulleys x 2 min flex, 1 min scaption and  258m abduction, ball rolls on wall  x 5 flexion and abd Soft tissue mobilization to left UT, pectorals, lateral trunk and SL to left UT and scapular area with cocoa butter. Instructed supine AA shoulder flexion and  scaption with wand x 5 ea PROM left shoulder flexion, scaption, abd, IR and ER with VC's to relax MFR to left axillary area of cording AROM bilateral shoulder flexion, scaption, horizontal abduction   03/09/2022  Pulleys x 2 min flex, 1 min scaption and abduction Soft tissue mobilization to left UT, pectorals, lateral trunk and SL to left UT and scapular area with cocoa butter. Instructed supine AA shoulder flexion and scaption with wand x 5 ea PROM left shoulder flexion, scaption, abd, IR and ER with VC's to relax MFR to left axillary area of cording  03/06/2022 Incisions healing but with scabbing still present Soft tissue mobilization to left UT, pectorals, lateral trunk and SL to left UT and scapular area with cocoa butter. Reviewed supine AA shoulder flexion and stargazer x 5 ea  and LTR with arms outstretched x 5 all with VC's to relax PROM left shoulder flexion, scaption, abd, IR and ER with VC's to relax After dressing pt practiced scapular retraction and wall slides x 5 ea, VC's to relax UT with wall slides Several thin chords noted in axilla with one crossing elbow   PATIENT EDUCATION:  Education details: HEP - closed chain flexion and previously issued HEPAccess Code: [email or web address] URL: [email or web address] Date: 03/20/2022 Prepared by: RoCheral AlmasExercises - Scapular Retraction with Resistance  - 1 x daily - 3 x weekly - 1 sets - 10 reps - Scapular Retraction with Resistance Advanced  - 1 x daily - 3 x weekly - 1 sets - 10  reps - Shoulder External Rotation and Scapular Retraction with Resistance  - 1 x daily - 3 x weekly - 1 sets - 10 reps - Corner Pec Major Stretch  - 2 x daily - 7 x weekly - 1 sets - 3 reps - 15-30 hold - Single Arm Doorway Pec Stretch at 90 Degrees Abduction  - 2 x daily - 7 x weekly - 1 sets - 3 reps - 15-30 hold, Person educated: Patient Education method: Explanation, Demonstration, and Handouts Education comprehension: verbalized  understanding and returned demonstration  HOME EXERCISE PROGRAM: Reviewed previously given post op HEP and gave closed chain shoulder flexion.  ASSESSMENT:  CLINICAL IMPRESSION: Pt continues with greatest limitation in left shoulder abduction. Both arms were very fatigued after doing snow angels today, and left arm fatigued at end of session. Pt encouraged to stretch over the long weekend perform skilled therapeutic intervention to improve on the following deficits: Decreased knowledge of precautions, impaired UE functional use, pain, decreased ROM, postural dysfunction.   PT treatment/interventions: ADL/Self care home management, Therapeutic exercises, Therapeutic activity, Patient/Family education, Self Care, Joint mobilization, Dry Needling, Spinal mobilization, Manual lymph drainage, scar mobilization, Manual therapy, and Re-evaluation   GOALS: Goals reviewed with patient? Yes  LONG TERM GOALS:  (STG=LTG)  GOALS Name Target Date  Goal status  1 Pt will demonstrate she has regained full shoulder ROM and function post operatively compared to baselines.  Baseline: 04/18/2022 INITIAL  2 Patient will demonstrate she has increased left shoulder flexion to >/= 140 degrees to reach overhead. 04/18/2022 MET  03/16/2022  3 Patient will increase left shoulder abduction to >/= 140 degrees to achieve radiation positioning. 04/18/2022 INITIAL  4 Patient will improve her DASH score to be </= 8 for improved overall UE function. 04/18/2022 INITIAL  5 Patient will report pain </= 2/10 with most of her daily activities to allow for better sleep and tolerate normal tasks. 03/27/2022 INITIAL     PLAN:  PT FREQUENCY/DURATION: 2x/week for 4 weeks  PLAN FOR NEXT SESSION: PROM, AAROM exercises, consider wand, STM prn,MFR to axilla, recert 23/53/6144 (sx Dec 9)   [facility]  [address]  [place] [address]  [phone]  After Breast Cancer Class It is  recommended you attend the ABC class to be educated on lymphedema risk reduction. This class is free of charge and lasts for 1 hour. It is a 1-time class. You will need to download the Webex app either on your phone or computer. We will send you a link the night before or the morning of the class. You should be able to click on that link to join the class. This is not a confidential class. You don't have to turn your camera on, but other participants may be able to see your email address. You are scheduled for November 20th at 12:00.  Scar massage You can begin gentle scar massage to you incision sites. Gently place one hand on the incision and move the skin (without sliding on the skin) in various directions. Do this for a few minutes and then you can gently massage either coconut oil or vitamin E cream into the scars.  Compression garment You should continue wearing your compression bra until you feel like you no longer have swelling.  Home exercise Program Continue doing the exercises you were given until you feel like you can do them without feeling any tightness at the end.   Walking Program Studies show that 30 minutes  of walking per day (fast enough to elevate your heart rate) can significantly reduce the risk of a cancer recurrence. If you can't walk due to other medical reasons, we encourage you to find another activity you could do (like a stationary bike or water exercise).  Posture After breast cancer surgery, people frequently sit with rounded shoulders posture because it puts their incisions on slack and feels better. If you sit like this and scar tissue forms in that position, you can become very tight and have pain sitting or standing with good posture. Try to be aware of your posture and sit and stand up tall to heal properly.  Follow up PT: It is recommended you return every 3 months for the first 3 years following surgery to be assessed on the SOZO machine for an L-Dex score.  This helps prevent clinically significant lymphedema in 95% of patients. These follow up screens are 10 minute appointments that you are not billed for.  Cheral Almas, PT 03/21/22 12:58 PM

## 2022-03-27 ENCOUNTER — Ambulatory Visit: Payer: 59

## 2022-03-27 DIAGNOSIS — C50212 Malignant neoplasm of upper-inner quadrant of left female breast: Secondary | ICD-10-CM | POA: Diagnosis not present

## 2022-03-27 DIAGNOSIS — Z170 Estrogen receptor positive status [ER+]: Secondary | ICD-10-CM

## 2022-03-27 DIAGNOSIS — Z483 Aftercare following surgery for neoplasm: Secondary | ICD-10-CM

## 2022-03-27 DIAGNOSIS — R293 Abnormal posture: Secondary | ICD-10-CM

## 2022-03-27 NOTE — Therapy (Signed)
OUTPATIENT PHYSICAL THERAPY BREAST CANCER TREATMENT   Patient Name: Samantha Pena MRN: [id number] DOB:06-20-1965, 56 y.o., female Today's Date: 03/28/2022   PT End of Session - 03/27/22 1359     Visit Number 9    Number of Visits 17    Date for PT Re-Evaluation 05/08/22    PT Start Time 1400    PT Stop Time 1456    PT Time Calculation (min) 56 min    Activity Tolerance Patient tolerated treatment well    Behavior During Therapy [facility] for tasks assessed/performed             Past Medical History:  Diagnosis Date   Anxiety    Cancer ([place])    COVID-19    2021   Cyst 01/2011   2016 stable. near rectum that gives pt discomfort when sitting and has grown in size in the past year    Migraines    Past Surgical History:  Procedure Laterality Date   BREAST RECONSTRUCTION WITH PLACEMENT OF TISSUE EXPANDER AND ALLODERM Left 01/31/2022   Procedure: BREAST RECONSTRUCTION WITH PLACEMENT OF TISSUE EXPANDER AND ALLODERM;  Surgeon: Irene Limbo, MD;  Location: [place];  Service: Plastics;  Laterality: Left;   CERVICAL POLYPECTOMY  02/21/2021   uterus   CESAREAN SECTION  03/18/1999   FOOT SURGERY Right 09/30/2002   bone in toe had to be shaved and realigned    LAPAROSCOPIC APPENDECTOMY N/A 10/08/2020   Procedure: APPENDECTOMY LAPAROSCOPIC;  Surgeon: Rolm Bookbinder, MD;  Location: [place];  Service: General;  Laterality: N/A;   NIPPLE SPARING MASTECTOMY WITH SENTINEL LYMPH NODE BIOPSY Left 01/31/2022   Procedure: LEFT NIPPLE SPARING MASTECTOMY WITH AXILLARY SENTINEL LYMPH NODE BIOPSY;  Surgeon: Rolm Bookbinder, MD;  Location: [place];  Service: General;  Laterality: Left;   Patient Active Problem List   Diagnosis Date Noted   S/P left mastectomy 01/31/2022   Malignant 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, estrogen receptor positive ([place]) 12/28/2021   S/P laparoscopic appendectomy 10/08/2020   History of Lyme disease 11/12/2018   Urge incontinence  11/12/2017   Overweight 05/07/2017   Allergic rhinitis 05/07/2017   Hyperlipidemia 10/31/2016   TMJ disease 03/17/2015   Anal benign neoplasm 02/14/2011   ACTINIC KERATOSIS, CHEEK, LEFT 11/13/2007   Migraine headache 10/30/2006    REFERRING PROVIDER: Dr. Rolm Bookbinder  REFERRING DIAG: Left breast cancer  THERAPY DIAG:  Malignant 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, estrogen receptor positive ([place]) - Plan: PT plan of care cert/re-cert  Abnormal posture - Plan: PT plan of care cert/re-cert  Aftercare following surgery for neoplasm - Plan: PT plan of care cert/re-cert  Rationale for Evaluation and Treatment: Rehabilitation  ONSET DATE: 01/31/2022  SUBJECTIVE:  SUBJECTIVE STATEMENT: I still have trouble reaching to higher cabinets. I don't walk my dog because he pulls, I avoid heavy lifting. I can close the car door, I dont feel as much tightness with stretching now. I have returned to vacuuming, dishes.  PERTINENT HISTORY:  Patient was diagnosed on 12/19/2021 with left grade 2 invasive ductal carcinoma breast cancer. She underwent a left mastectomy with immediate reconstruction with expanders placed and a sentinel node biopsy (1/1 node positive) on 01/31/2022. It is ER/PR positive and HER2 negative with a Ki67 of 10%.   PATIENT GOALS:  Reassess how my recovery is going related to arm function, pain, and swelling.  PAIN:  Are you having pain? No not today, just discomfort/soreness  PRECAUTIONS: Recent Surgery, left UE Lymphedema risk  ACTIVITY LEVEL / LEISURE: She is walking some but nothing strenuous.   OBJECTIVE:   PATIENT SURVEYS:   QUICK VXYI:01.65, at recert 53/74/8270 :78.67      OBSERVATIONS: Incision appears to be healing well with some scabby areas still  present. Drain sites have healed nicely. Some mild edema present on left lateral chest. No cording present. Left scapula with little to no movement. Palpable tightness present left rhomboid and upper trap region. Issued compression foam in soft stockinette for pt to apply to lateral trunk under bra to reduce pain. She reported relief.  POSTURE:  Forward head and rounded shoulders posture  LYMPHEDEMA ASSESSMENT:   UPPER EXTREMITY AROM/PROM:   A/PROM RIGHT   eval    Shoulder extension 53  Shoulder flexion 145  Shoulder abduction 146  Shoulder internal rotation 78  Shoulder external rotation 84                          (Blank rows = not tested)   A/PROM LEFT   eval LEFT 02/27/2022 LEFT 03/16/2022 LEFT 03/27/2022  Shoulder extension 53 40 47 54  Shoulder flexion 141 100 151 155  Shoulder abduction 160 94 135 147  Shoulder internal rotation 57 58 75 75  Shoulder external rotation 72 62 90 92                          (Blank rows = not tested)     CERVICAL AROM: All within normal limits   UPPER EXTREMITY STRENGTH: WNL     LYMPHEDEMA ASSESSMENTS:    LANDMARK RIGHT   eval RIGHT 02/27/2022  10 cm proximal to olecranon process 27.8 26.5  Olecranon process 25.3 24.4  10 cm proximal to ulnar styloid process 21.1 20.4  Just proximal to ulnar styloid process 16 15.7  Across hand at thumb web space 18.2 18.2  At base of 2nd digit 6.2 6.1  (Blank rows = not tested)   LANDMARK LEFT   eval LEFT 02/27/2022  10 cm proximal to olecranon process 28 27  Olecranon process _0 cm proximal to ulnar styloid process 20.7 20.5  Just proximal to ulnar styloid process 15.5 15.7  Across hand at thumb web space 17.5 17  At base of 2nd digit 5.9 6  (Blank rows = not tested)    Surgery type/Date: Left mastectomy with reconstruction and sentinel node biopsy 01/31/2022 Number of lymph nodes removed: 1 Current/past treatment (chemo, radiation, hormone therapy): none Other symptoms:   Heaviness/tightness Yes Pain Yes Pitting edema No Infections No Decreased scar mobility Yes Stemmer sign No  TREATMENT TODAY 03/27/2022 Soft tissue mobilization to left UT, pectorals,  lateral trunk and SL to left UT and scapular area with cocoa butter. Supine Wand flexion and scaption x 5 ea PROM left shoulder flexion, scaption, abd, IR and ER with VC's to relax LTR x 5 with arms in goal post position Wall slides x 5 for abduction Measured ROM   03/21/2022 Wall slides x 7, Pec wall stretch single arm x 3, Lat stretch x4 standing Soft tissue mobilization to left UT, pectorals, lateral trunk and SL to left UT and scapular area with cocoa butter. PROM left shoulder flexion, scaption, abd, IR and ER with VC's to relax  Left scapular mobs in right SL, protraction retraction, elevation depression Snow angels 2 x 5  Supine scapular series: flexion and ER yellow x 10 Standing shoulder flexion and abduction x 10, reach to high shelf with 1# x 5 03/20/2022 Soft tissue mobilization to left UT, pectorals, lateral trunk and SL to left UT and scapular area with cocoa butter. supine AA shoulder flexion x 3 and scaption with wand x 3 narrower and wider grip ea, 10 sec hold PROM left shoulder flexion, scaption, abd, IR and ER with VC's to relax AROM supine bilateral shoulder flexion, scaption, abduction, horizontal abd x 5 Scapular Retraction with Resistance  - yellow x 10  - Scapular Retraction with Resistance Advanced  - 10 reps - Shoulder External Rotation and Scapular Retraction with Resistance  - 10 reps - Corner Pec Major Stretch  - 2 x daily - 7 x weekly - 1 sets - 3 reps - 15-30 hold - Single Arm Doorway Pec Stretch at 90 Degrees Abduction  - 2 x daily - 7 x week Updated HEP 03/16/2022 Assessed incision; mild redness remains but does not appear infected. Appears like a resolving rash around lower breast incision Pulleys x 2 min flex, 1 min scaption and  70mn abduction,  Soft tissue  mobilization to left UT, pectorals, lateral trunk and SL to left UT and scapular area with cocoa butter. Instructed supine AA shoulder flexion and scaption with wand x 3 ea PROM left shoulder flexion, scaption, abd, IR and ER with VC's to relax MFR to left axillary area of cording Measured ROM AROM standing bilateral flexion and scaption x 6 ea   03/13/2022 Pulleys x 2 min flex, 1 min scaption and  258m abduction, ball rolls on wall  x 5 flexion and abd Soft tissue mobilization to left UT, pectorals, lateral trunk and SL to left UT and scapular area with cocoa butter. Instructed supine AA shoulder flexion and scaption with wand x 5 ea PROM left shoulder flexion, scaption, abd, IR and ER with VC's to relax MFR to left axillary area of cording AROM bilateral shoulder flexion, scaption, horizontal abduction    PATIENT EDUCATION:  Education details: HEP - closed chain flexion and previously issued HEPAccess Code: [email or web address] URL: [email or web address] Date: 03/20/2022 Prepared by: RoCheral AlmasExercises - Scapular Retraction with Resistance  - 1 x daily - 3 x weekly - 1 sets - 10 reps - Scapular Retraction with Resistance Advanced  - 1 x daily - 3 x weekly - 1 sets - 10 reps - Shoulder External Rotation and Scapular Retraction with Resistance  - 1 x daily - 3 x weekly - 1 sets - 10 reps - Corner Pec Major Stretch  - 2 x daily - 7 x weekly - 1 sets - 3 reps - 15-30 hold - Single Arm Doorway Pec Stretch at 90 Degrees Abduction  - 2 x daily - 7  x weekly - 1 sets - 3 reps - 15-30 hold, Person educated: Patient Education method: Explanation, Demonstration, and Handouts Education comprehension: verbalized understanding and returned demonstration  HOME EXERCISE PROGRAM: Reviewed previously given post op HEP and gave closed chain shoulder flexion.  ASSESSMENT:  CLINICAL IMPRESSION: Pt has achieved 3 of 5 goals established. She has met ROM goals for Lfexion and abd, but has  not yet met full ROM goals as she is still lacking 13 degrees of abduction. She has improved with her quick dash goal but has not yet achieved. She is feeling much better overall and is able to do most household chores with pain 2/10 or less. We will extend her dates as she has another surgery pending on Dec. 7. perform skilled therapeutic intervention to improve on the following deficits: Decreased knowledge of precautions, impaired UE functional use, pain, decreased ROM, postural dysfunction.   PT treatment/interventions: ADL/Self care home management, Therapeutic exercises, Therapeutic activity, Patient/Family education, Self Care, Joint mobilization, Dry Needling, Spinal mobilization, Manual lymph drainage, scar mobilization, Manual therapy, and Re-evaluation   GOALS: Goals reviewed with patient? Yes  LONG TERM GOALS:  (STG=LTG)  GOALS Name Target Date  Goal status  1 Pt will demonstrate she has regained full shoulder ROM and function post operatively compared to baselines.  Baseline: 05/08/2022 In Progress  2 Patient will demonstrate she has increased left shoulder flexion to >/= 140 degrees to reach overhead. 04/25/2022 MET  03/16/2022  3 Patient will increase left shoulder abduction to >/= 140 degrees to achieve radiation positioning. 04/25/2022 MET 03/27/2022  4 Patient will improve her DASH score to be </= 8 for improved overall UE function. 05/08/2022 In progress  5 Patient will report pain </= 2/10 with most of her daily activities to allow for better sleep and tolerate normal tasks. 03/27/2022 MET     PLAN:  PT FREQUENCY/DURATION: 2x/week for 4 weeks  PLAN FOR NEXT SESSION: PROM, AAROM exercises, consider wand, STM prn,MFR to axilla, recert 16/01/9603 (sx Dec 9)   [facility]  [address], [address]  [place] [address]  [phone]  After Breast Cancer Class It is recommended you attend the ABC class to be educated on lymphedema risk reduction.  This class is free of charge and lasts for 1 hour. It is a 1-time class. You will need to download the Webex app either on your phone or computer. We will send you a link the night before or the morning of the class. You should be able to click on that link to join the class. This is not a confidential class. You don't have to turn your camera on, but other participants may be able to see your email address. You are scheduled for November 20th at 12:00.  Scar massage You can begin gentle scar massage to you incision sites. Gently place one hand on the incision and move the skin (without sliding on the skin) in various directions. Do this for a few minutes and then you can gently massage either coconut oil or vitamin E cream into the scars.  Compression garment You should continue wearing your compression bra until you feel like you no longer have swelling.  Home exercise Program Continue doing the exercises you were given until you feel like you can do them without feeling any tightness at the end.   Walking Program Studies show that 30 minutes of walking per day (fast enough to elevate your heart rate) can significantly reduce the risk of  a cancer recurrence. If you can't walk due to other medical reasons, we encourage you to find another activity you could do (like a stationary bike or water exercise).  Posture After breast cancer surgery, people frequently sit with rounded shoulders posture because it puts their incisions on slack and feels better. If you sit like this and scar tissue forms in that position, you can become very tight and have pain sitting or standing with good posture. Try to be aware of your posture and sit and stand up tall to heal properly.  Follow up PT: It is recommended you return every 3 months for the first 3 years following surgery to be assessed on the SOZO machine for an L-Dex score. This helps prevent clinically significant lymphedema in 95% of patients. These follow  up screens are 10 minute appointments that you are not billed for.  Cheral Almas, PT 03/28/22 2:31 PM

## 2022-03-29 ENCOUNTER — Ambulatory Visit: Payer: 59

## 2022-03-29 DIAGNOSIS — C50212 Malignant neoplasm of upper-inner quadrant of left female breast: Secondary | ICD-10-CM

## 2022-03-29 DIAGNOSIS — Z483 Aftercare following surgery for neoplasm: Secondary | ICD-10-CM

## 2022-03-29 DIAGNOSIS — R293 Abnormal posture: Secondary | ICD-10-CM

## 2022-03-29 NOTE — Patient Instructions (Signed)

## 2022-03-29 NOTE — Therapy (Signed)
OUTPATIENT PHYSICAL THERAPY BREAST CANCER TREATMENT   Patient Name: Samantha Pena MRN: [id number] DOB:August 21, 1965, 56 y.o., female Today's Date: 03/29/2022   PT End of Session - 03/29/22 1158     Visit Number 10    Number of Visits 17    Date for PT Re-Evaluation 05/08/22    PT Start Time 1200    PT Stop Time 1250    PT Time Calculation (min) 50 min    Activity Tolerance Patient tolerated treatment well    Behavior During Therapy [facility] for tasks assessed/performed             Past Medical History:  Diagnosis Date   Anxiety    Cancer ([place])    COVID-19    2021   Cyst 01/2011   2016 stable. near rectum that gives pt discomfort when sitting and has grown in size in the past year    Migraines    Past Surgical History:  Procedure Laterality Date   BREAST RECONSTRUCTION WITH PLACEMENT OF TISSUE EXPANDER AND ALLODERM Left 01/31/2022   Procedure: BREAST RECONSTRUCTION WITH PLACEMENT OF TISSUE EXPANDER AND ALLODERM;  Surgeon: Irene Limbo, MD;  Location: [place];  Service: Plastics;  Laterality: Left;   CERVICAL POLYPECTOMY  02/21/2021   uterus   CESAREAN SECTION  03/18/1999   FOOT SURGERY Right 09/30/2002   bone in toe had to be shaved and realigned    LAPAROSCOPIC APPENDECTOMY N/A 10/08/2020   Procedure: APPENDECTOMY LAPAROSCOPIC;  Surgeon: Rolm Bookbinder, MD;  Location: [place];  Service: General;  Laterality: N/A;   NIPPLE SPARING MASTECTOMY WITH SENTINEL LYMPH NODE BIOPSY Left 01/31/2022   Procedure: LEFT NIPPLE SPARING MASTECTOMY WITH AXILLARY SENTINEL LYMPH NODE BIOPSY;  Surgeon: Rolm Bookbinder, MD;  Location: [place];  Service: General;  Laterality: Left;   Patient Active Problem List   Diagnosis Date Noted   S/P left mastectomy 01/31/2022   Malignant 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, estrogen receptor positive ([place]) 12/28/2021   S/P laparoscopic appendectomy 10/08/2020   History of Lyme disease 11/12/2018   Urge incontinence  11/12/2017   Overweight 05/07/2017   Allergic rhinitis 05/07/2017   Hyperlipidemia 10/31/2016   TMJ disease 03/17/2015   Anal benign neoplasm 02/14/2011   ACTINIC KERATOSIS, CHEEK, LEFT 11/13/2007   Migraine headache 10/30/2006    REFERRING PROVIDER: Dr. Rolm Bookbinder  REFERRING DIAG: Left breast cancer  THERAPY DIAG:  Malignant 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, estrogen receptor positive ([place])  Abnormal posture  Aftercare following surgery for neoplasm  Rationale for Evaluation and Treatment: Rehabilitation  ONSET DATE: 01/31/2022  SUBJECTIVE:  SUBJECTIVE STATEMENT: Did well after last visit. Every once in a while my scapula stiffens up.  PERTINENT HISTORY:  Patient was diagnosed on 12/19/2021 with left grade 2 invasive ductal carcinoma breast cancer. She underwent a left mastectomy with immediate reconstruction with expanders placed and a sentinel node biopsy (1/1 node positive) on 01/31/2022. It is ER/PR positive and HER2 negative with a Ki67 of 10%.   PATIENT GOALS:  Reassess how my recovery is going related to arm function, pain, and swelling.  PAIN:  Are you having pain? No not today, just discomfort/soreness  PRECAUTIONS: Recent Surgery, left UE Lymphedema risk  ACTIVITY LEVEL / LEISURE: She is walking some but nothing strenuous.   OBJECTIVE:   PATIENT SURVEYS:   QUICK LKJZ:79.15, at recert 05/69/7948 :01.65      OBSERVATIONS: Incision appears to be healing well with some scabby areas still present. Drain sites have healed nicely. Some mild edema present on left lateral chest. No cording present. Left scapula with little to no movement. Palpable tightness present left rhomboid and upper trap region. Issued compression foam in soft stockinette for pt  to apply to lateral trunk under bra to reduce pain. She reported relief.  POSTURE:  Forward head and rounded shoulders posture  LYMPHEDEMA ASSESSMENT:   UPPER EXTREMITY AROM/PROM:   A/PROM RIGHT   eval    Shoulder extension 53  Shoulder flexion 145  Shoulder abduction 146  Shoulder internal rotation 78  Shoulder external rotation 84                          (Blank rows = not tested)   A/PROM LEFT   eval LEFT 02/27/2022 LEFT 03/16/2022 LEFT 03/27/2022  Shoulder extension 53 40 47 54  Shoulder flexion 141 100 151 155  Shoulder abduction 160 94 135 147  Shoulder internal rotation 57 58 75 75  Shoulder external rotation 72 62 90 92                          (Blank rows = not tested)     CERVICAL AROM: All within normal limits   UPPER EXTREMITY STRENGTH: WNL     LYMPHEDEMA ASSESSMENTS:    LANDMARK RIGHT   eval RIGHT 02/27/2022  10 cm proximal to olecranon process 27.8 26.5  Olecranon process 25.3 24.4  10 cm proximal to ulnar styloid process 21.1 20.4  Just proximal to ulnar styloid process 16 15.7  Across hand at thumb web space 18.2 18.2  At base of 2nd digit 6.2 6.1  (Blank rows = not tested)   LANDMARK LEFT   eval LEFT 02/27/2022  10 cm proximal to olecranon process 28 27  Olecranon process _0 cm proximal to ulnar styloid process 20.7 20.5  Just proximal to ulnar styloid process 15.5 15.7  Across hand at thumb web space 17.5 17  At base of 2nd digit 5.9 6  (Blank rows = not tested)    Surgery type/Date: Left mastectomy with reconstruction and sentinel node biopsy 01/31/2022 Number of lymph nodes removed: 1 Current/past treatment (chemo, radiation, hormone therapy): none Other symptoms:  Heaviness/tightness Yes Pain Yes Pitting edema No Infections No Decreased scar mobility Yes Stemmer sign No  TREATMENT TODAY 03/29/2022 Soft tissue mobilization to left UT, pectorals, lateral trunk and SL to left UT and scapular area with cocoa  butter. Supine Wand flexion and scaption x 3 ea PROM left shoulder flexion, scaption, abd,  IR and ER with VC's to relax Snow angels x 5 LTR x 5 with arms in goal post position Supine scapular series all x 5 reps, VC's throughout. TC's for Sword LEFT PECTORAL STRETCH X 3 IN DOORWAY 3x15 sec 03/27/2022 Soft tissue mobilization to left UT, pectorals, lateral trunk and SL to left UT and scapular area with cocoa butter. Supine Wand flexion and scaption x 5 ea PROM left shoulder flexion, scaption, abd, IR and ER with VC's to relax LTR x 5 with arms in goal post position Wall slides x 5 for abduction Measured ROM   03/21/2022 Wall slides x 7, Pec wall stretch single arm x 3, Lat stretch x4 standing Soft tissue mobilization to left UT, pectorals, lateral trunk and SL to left UT and scapular area with cocoa butter. PROM left shoulder flexion, scaption, abd, IR and ER with VC's to relax  Left scapular mobs in right SL, protraction retraction, elevation depression Snow angels 2 x 5  Supine scapular series: flexion and ER yellow x 10 Standing shoulder flexion and abduction x 10, reach to high shelf with 1# x 5 03/20/2022 Soft tissue mobilization to left UT, pectorals, lateral trunk and SL to left UT and scapular area with cocoa butter. supine AA shoulder flexion x 3 and scaption with wand x 3 narrower and wider grip ea, 10 sec hold PROM left shoulder flexion, scaption, abd, IR and ER with VC's to relax AROM supine bilateral shoulder flexion, scaption, abduction, horizontal abd x 5 Scapular Retraction with Resistance  - yellow x 10  - Scapular Retraction with Resistance Advanced  - 10 reps - Shoulder External Rotation and Scapular Retraction with Resistance  - 10 reps - Corner Pec Major Stretch  - 2 x daily - 7 x weekly - 1 sets - 3 reps - 15-30 hold - Single Arm Doorway Pec Stretch at 90 Degrees Abduction  - 2 x daily - 7 x week Updated HEP 03/16/2022 Assessed incision; mild redness remains  but does not appear infected. Appears like a resolving rash around lower breast incision Pulleys x 2 min flex, 1 min scaption and  39mn abduction,  Soft tissue mobilization to left UT, pectorals, lateral trunk and SL to left UT and scapular area with cocoa butter. Instructed supine AA shoulder flexion and scaption with wand x 3 ea PROM left shoulder flexion, scaption, abd, IR and ER with VC's to relax MFR to left axillary area of cording Measured ROM AROM standing bilateral flexion and scaption x 6 ea   03/13/2022 Pulleys x 2 min flex, 1 min scaption and  283m abduction, ball rolls on wall  x 5 flexion and abd Soft tissue mobilization to left UT, pectorals, lateral trunk and SL to left UT and scapular area with cocoa butter. Instructed supine AA shoulder flexion and scaption with wand x 5 ea PROM left shoulder flexion, scaption, abd, IR and ER with VC's to relax MFR to left axillary area of cording AROM bilateral shoulder flexion, scaption, horizontal abduction    PATIENT EDUCATION: 03/29/2022; Supine Scapular series yellow x 5   Education details: HEP - closed chain flexion and previously issued HEPAccess Code: [email or web address] URL: [email or web address] Date: 03/20/2022 Prepared by: RoCheral AlmasExercises - Scapular Retraction with Resistance  - 1 x daily - 3 x weekly - 1 sets - 10 reps - Scapular Retraction with Resistance Advanced  - 1 x daily - 3 x weekly - 1 sets - 10 reps - Shoulder External Rotation and Scapular Retraction  with Resistance  - 1 x daily - 3 x weekly - 1 sets - 10 reps - Corner Pec Major Stretch  - 2 x daily - 7 x weekly - 1 sets - 3 reps - 15-30 hold - Single Arm Doorway Pec Stretch at 90 Degrees Abduction  - 2 x daily - 7 x weekly - 1 sets - 3 reps - 15-30 hold, Person educated: Patient Education method: Explanation, Demonstration, and Handouts Education comprehension: verbalized understanding and returned demonstration  HOME EXERCISE  PROGRAM: Reviewed previously given post op HEP and gave closed chain shoulder flexion.  ASSESSMENT:  CLINICAL IMPRESSION: Pt demonstrated excellent ROM today with supine wand exercises. She did well with supine scapular stab exs with occasional VC's for correct form. Her pectorals on left stay tight with multiple tender points noted  perform skilled therapeutic intervention to improve on the following deficits: Decreased knowledge of precautions, impaired UE functional use, pain, decreased ROM, postural dysfunction.   PT treatment/interventions: ADL/Self care home management, Therapeutic exercises, Therapeutic activity, Patient/Family education, Self Care, Joint mobilization, Dry Needling, Spinal mobilization, Manual lymph drainage, scar mobilization, Manual therapy, and Re-evaluation   GOALS: Goals reviewed with patient? Yes  LONG TERM GOALS:  (STG=LTG)  GOALS Name Target Date  Goal status  1 Pt will demonstrate she has regained full shoulder ROM and function post operatively compared to baselines.  Baseline: 05/08/2022 In Progress  2 Patient will demonstrate she has increased left shoulder flexion to >/= 140 degrees to reach overhead. 04/26/2022 MET  03/16/2022  3 Patient will increase left shoulder abduction to >/= 140 degrees to achieve radiation positioning. 04/26/2022 MET 03/27/2022  4 Patient will improve her DASH score to be </= 8 for improved overall UE function. 05/08/2022 In progress  5 Patient will report pain </= 2/10 with most of her daily activities to allow for better sleep and tolerate normal tasks. 03/27/2022 MET     PLAN:  PT FREQUENCY/DURATION: 2x/week for 4 weeks  PLAN FOR NEXT SESSION: PROM, AAROM exercises, consider wand, STM prn,MFR to axilla, Surgery 04/06/2022   [facility] [address]  [phone]  After Breast Cancer Class It is recommended you attend the ABC class to be educated on lymphedema risk reduction. This class is free of charge and lasts for 1  hour. It is a 1-time class. You will need to download the Webex app either on your phone or computer. We will send you a link the night before or the morning of the class. You should be able to click on that link to join the class. This is not a confidential class. You don't have to turn your camera on, but other participants may be able to see your email address. You are scheduled for November 20th at 12:00.  Scar massage You can begin gentle scar massage to you incision sites. Gently place one hand on the incision and move the skin (without sliding on the skin) in various directions. Do this for a few minutes and then you can gently massage either coconut oil or vitamin E cream into the scars.  Compression garment You should continue wearing your compression bra until you feel like you no longer have swelling.  Home exercise Program Continue doing the exercises you were given until you feel like you can do them without feeling any tightness at the end.   Walking Program Studies show that 30 minutes of walking per day (fast enough to elevate your heart rate) can significantly reduce the risk of a  cancer recurrence. If you can't walk due to other medical reasons, we encourage you to find another activity you could do (like a stationary bike or water exercise).  Posture After breast cancer surgery, people frequently sit with rounded shoulders posture because it puts their incisions on slack and feels better. If you sit like this and scar tissue forms in that position, you can become very tight and have pain sitting or standing with good posture. Try to be aware of your posture and sit and stand up tall to heal properly.  Follow up PT: It is recommended you return every 3 months for the first 3 years following surgery to be assessed on the SOZO machine for an L-Dex score. This helps prevent clinically significant lymphedema in 95% of patients. These follow up screens are 10 minute appointments that  you are not billed for.  Cheral Almas, PT 03/29/22 12:53 PM

## 2022-03-30 ENCOUNTER — Other Ambulatory Visit: Payer: Self-pay | Admitting: General Surgery

## 2022-03-30 NOTE — Pre-Procedure Instructions (Signed)
Surgical Instructions    Your procedure is scheduled on Thursday, December 7th.  Report to [facility] Main Entrance "A" at 05:30 A.M., then check in with the Admitting office.  Call this number if you have problems the morning of surgery:  [phone]   If you have any questions prior to your surgery date call [phone]: Open Monday-Friday 8am-4pm    Remember:  Do not eat after midnight the night before your surgery  You may drink clear liquids until 04:30 AM the morning of your surgery.   Clear liquids allowed are: Water, Non-Citrus Juices (without pulp), Carbonated Beverages, Clear Tea, Black Coffee Only (NO MILK, CREAM OR POWDERED CREAMER of any kind), and Gatorade.    Take these medicines the morning of surgery with A SIP OF WATER  fluticasone (FLONASE)  tamoxifen (NOLVADEX)    If needed: ALPRAZolam Duanne Moron)  SUMAtriptan (IMITREX)     As of today, STOP taking any Aspirin (unless otherwise instructed by your surgeon) Aleve, Naproxen, Ibuprofen, Motrin, Advil, Goody's, BC's, all herbal medications, fish oil, and all vitamins.                     Do NOT Smoke (Tobacco/Vaping) for 24 hours prior to your procedure.  If you use a CPAP at night, you may bring your mask/headgear for your overnight stay.   Contacts, glasses, piercing's, hearing aid's, dentures or partials may not be worn into surgery, please bring cases for these belongings.    For patients admitted to the hospital, discharge time will be determined by your treatment team.   Patients discharged the day of surgery will not be allowed to drive home, and someone needs to stay with them for 24 hours.  SURGICAL WAITING ROOM VISITATION Patients having surgery or a procedure may have no more than 2 support people in the waiting area - these visitors may rotate.   Children under the age of 38 must have an adult with them who is not the patient. If the patient needs to stay at the hospital during part of their  recovery, the visitor guidelines for inpatient rooms apply. Pre-op nurse will coordinate an appropriate time for 1 support person to accompany patient in pre-op.  This support person may not rotate.   Please refer to the [facility] website for the visitor guidelines for Inpatients (after your surgery is over and you are in a regular room).    Special instructions:   [facility]- Preparing For Surgery  Before surgery, you can play an important role. Because skin is not sterile, your skin needs to be as free of germs as possible. You can reduce the number of germs on your skin by washing with CHG (chlorahexidine gluconate) Soap before surgery.  CHG is an antiseptic cleaner which kills germs and bonds with the skin to continue killing germs even after washing.    Oral Hygiene is also important to reduce your risk of infection.  Remember - BRUSH YOUR TEETH THE MORNING OF SURGERY WITH YOUR REGULAR TOOTHPASTE  Please do not use if you have an allergy to CHG or antibacterial soaps. If your skin becomes reddened/irritated stop using the CHG.  Do not shave (including legs and underarms) for at least 48 hours prior to first CHG shower. It is OK to shave your face.  Please follow these instructions carefully.   Shower the NIGHT BEFORE SURGERY and the MORNING OF SURGERY  If you chose to wash your hair, wash your hair first as usual  with your normal shampoo.  After you shampoo, rinse your hair and body thoroughly to remove the shampoo.  Use CHG Soap as you would any other liquid soap. You can apply CHG directly to the skin and wash gently with a scrungie or a clean washcloth.   Apply the CHG Soap to your body ONLY FROM THE NECK DOWN.  Do not use on open wounds or open sores. Avoid contact with your eyes, ears, mouth and genitals (private parts). Wash Face and genitals (private parts)  with your normal soap.   Wash thoroughly, paying special attention to the area where your surgery will be  performed.  Thoroughly rinse your body with warm water from the neck down.  DO NOT shower/wash with your normal soap after using and rinsing off the CHG Soap.  Pat yourself dry with a CLEAN TOWEL.  Wear CLEAN PAJAMAS to bed the night before surgery  Place CLEAN SHEETS on your bed the night before your surgery  DO NOT SLEEP WITH PETS.   Day of Surgery: Take a shower with CHG soap. Do not wear jewelry or makeup Do not wear lotions, powders, perfumes, or deodorant. Do not shave 48 hours prior to surgery.   Do not bring valuables to the hospital. [facility] is not responsible for any belongings or valuables. Do not wear nail polish, gel polish, artificial nails, or any other type of covering on natural nails (fingers and toes) If you have artificial nails or gel coating that need to be removed by a nail salon, please have this removed prior to surgery. Artificial nails or gel coating may interfere with anesthesia's ability to adequately monitor your vital signs. Wear Clean/Comfortable clothing the morning of surgery Remember to brush your teeth WITH YOUR REGULAR TOOTHPASTE.   Please read over the following fact sheets that you were given.    If you received a COVID test during your pre-op visit  it is requested that you wear a mask when out in public, stay away from anyone that may not be feeling well and notify your surgeon if you develop symptoms. If you have been in contact with anyone that has tested positive in the last 10 days please notify you surgeon.

## 2022-03-31 ENCOUNTER — Encounter (HOSPITAL_COMMUNITY): Payer: Self-pay

## 2022-03-31 ENCOUNTER — Encounter (HOSPITAL_COMMUNITY)
Admission: RE | Admit: 2022-03-31 | Discharge: 2022-03-31 | Disposition: A | Discharge status: Home / Self Care | Payer: 59 | Source: Ambulatory Visit | Attending: General Surgery | Admitting: General Surgery

## 2022-03-31 ENCOUNTER — Other Ambulatory Visit: Payer: Self-pay

## 2022-03-31 VITALS — BP 131/87 | HR 72 | Temp 97.6°F | Resp 17 | Ht 66.0 in | Wt 149.9 lb

## 2022-03-31 DIAGNOSIS — Z01818 Encounter for other preprocedural examination: Secondary | ICD-10-CM

## 2022-03-31 DIAGNOSIS — Z9012 Acquired absence of left breast and nipple: Secondary | ICD-10-CM

## 2022-03-31 DIAGNOSIS — Z01812 Encounter for preprocedural laboratory examination: Secondary | ICD-10-CM | POA: Diagnosis present

## 2022-03-31 HISTORY — DX: Personal history of other diseases of the digestive system: Z87.19

## 2022-03-31 LAB — BASIC METABOLIC PANEL
Anion gap: 5 (ref 5–15)
BUN: 17 mg/dL (ref 6–20)
CO2: 27 mmol/L (ref 22–32)
Calcium: 9.5 mg/dL (ref 8.9–10.3)
Chloride: 108 mmol/L (ref 98–111)
Creatinine, Ser: 0.92 mg/dL (ref 0.44–1.00)
GFR, Estimated: >60 mL/min (ref >60)
Glucose, Bld: 91 mg/dL (ref 70–99)
Potassium: 4.2 mmol/L (ref 3.5–5.1)
Sodium: 140 mmol/L (ref 135–145)

## 2022-03-31 LAB — CBC
HCT: 39.5 % (ref 36.0–46.0)
Hemoglobin: 12.8 g/dL (ref 12.0–15.0)
MCH: 27.8 pg (ref 26.0–34.0)
MCHC: 32.4 g/dL (ref 30.0–36.0)
MCV: 85.9 fL (ref 80.0–100.0)
Platelets: 250 10*3/uL (ref 150–400)
RBC: 4.6 MIL/uL (ref 3.87–5.11)
RDW: 12.9 % (ref 11.5–15.5)
WBC: 5.9 10*3/uL (ref 4.0–10.5)
nRBC: 0 % (ref 0.0–0.2)

## 2022-03-31 NOTE — Progress Notes (Signed)
PCP - Dr. Garret Reddish Cardiologist - denies  PPM/ICD - denies   Chest x-ray - denies EKG - 05/13/08 Stress Test - denies ECHO - denies Cardiac Cath - denies  Sleep Study - denies   DM- denies    ASA/Blood Thinner Instructions: n/a   ERAS Protcol - yes PRE-SURGERY Ensure given at PAT  COVID TEST- n/a   Anesthesia review: no  Patient denies shortness of breath, fever, cough and chest pain at PAT appointment   All instructions explained to the patient, with a verbal understanding of the material. Patient agrees to go over the instructions while at home for a better understanding. The opportunity to ask questions was provided.

## 2022-04-04 ENCOUNTER — Ambulatory Visit: Payer: 59 | Attending: General Surgery

## 2022-04-04 DIAGNOSIS — Z170 Estrogen receptor positive status [ER+]: Secondary | ICD-10-CM | POA: Diagnosis present

## 2022-04-04 DIAGNOSIS — R293 Abnormal posture: Secondary | ICD-10-CM | POA: Diagnosis present

## 2022-04-04 DIAGNOSIS — Z483 Aftercare following surgery for neoplasm: Secondary | ICD-10-CM | POA: Diagnosis present

## 2022-04-04 DIAGNOSIS — C50212 Malignant neoplasm of upper-inner quadrant of left female breast: Secondary | ICD-10-CM | POA: Insufficient documentation

## 2022-04-04 NOTE — Therapy (Signed)
OUTPATIENT PHYSICAL THERAPY BREAST CANCER TREATMENT   Patient Name: Samantha Pena MRN: [id number] DOB:1965-07-25, 56 y.o., female Today's Date: 04/04/2022   PT End of Session - 04/04/22 1159     Visit Number 11    Number of Visits 17    Date for PT Re-Evaluation 05/08/22    PT Start Time 1202    PT Stop Time 1250    PT Time Calculation (min) 48 min    Activity Tolerance Patient tolerated treatment well    Behavior During Therapy [facility] for tasks assessed/performed             Past Medical History:  Diagnosis Date   Anxiety    Cancer ([place]) 2023   breast   COVID-19    2021   Cyst 01/2011   2016 stable. near rectum that gives pt discomfort when sitting and has grown in size in the past year    History of hiatal hernia    Migraines    Past Surgical History:  Procedure Laterality Date   BREAST RECONSTRUCTION WITH PLACEMENT OF TISSUE EXPANDER AND ALLODERM Left 01/31/2022   Procedure: BREAST RECONSTRUCTION WITH PLACEMENT OF TISSUE EXPANDER AND ALLODERM;  Surgeon: Irene Limbo, MD;  Location: [place];  Service: Plastics;  Laterality: Left;   CERVICAL POLYPECTOMY  02/21/2021   uterus   CESAREAN SECTION  03/18/1999   FOOT SURGERY Right 09/30/2002   bone in toe had to be shaved and realigned    LAPAROSCOPIC APPENDECTOMY N/A 10/08/2020   Procedure: APPENDECTOMY LAPAROSCOPIC;  Surgeon: Rolm Bookbinder, MD;  Location: [place];  Service: General;  Laterality: N/A;   NIPPLE SPARING MASTECTOMY WITH SENTINEL LYMPH NODE BIOPSY Left 01/31/2022   Procedure: LEFT NIPPLE SPARING MASTECTOMY WITH AXILLARY SENTINEL LYMPH NODE BIOPSY;  Surgeon: Rolm Bookbinder, MD;  Location: [place];  Service: General;  Laterality: Left;   Patient Active Problem List   Diagnosis Date Noted   S/P left mastectomy 01/31/2022   Malignant 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 Rolm Bookbinder  REFERRING DIAG: Left breast cancer  THERAPY DIAG:  Malignant 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, estrogen receptor positive ([place])  Abnormal posture  Aftercare following surgery for neoplasm  Rationale for Evaluation and Treatment: Rehabilitation  ONSET DATE: 01/31/2022  SUBJECTIVE:  SUBJECTIVE STATEMENT: Things are going well. Think my ROM is better. I have been working on it.   PERTINENT HISTORY:  Patient was diagnosed on 12/19/2021 with left grade 2 invasive ductal carcinoma breast cancer. She underwent a left mastectomy with immediate reconstruction with expanders placed and a sentinel node biopsy (1/1 node positive) on 01/31/2022. It is ER/PR positive and HER2 negative with a Ki67 of 10%.   PATIENT GOALS:  Reassess how my recovery is going related to arm function, pain, and swelling.  PAIN:  Are you having pain? No  PRECAUTIONS: Recent Surgery, left UE Lymphedema risk  ACTIVITY LEVEL / LEISURE: She is walking some but nothing strenuous.   OBJECTIVE:   PATIENT SURVEYS:   QUICK JEHU:31.49, at recert 70/26/3785 :88.50, 04/04/2022  9.09  Quick Dash - 04/04/22 0001     Open a tight or new jar Mild difficulty    Do heavy household chores (wash walls, wash floors) Mild difficulty    Carry a shopping bag or briefcase No difficulty    Wash your back Mild difficulty    Use a knife to cut food No difficulty    Recreational activities in which you take some force  or impact through your arm, shoulder, or hand (golf, hammering, tennis) No difficulty    During the past week, to what extent has your arm, shoulder or hand problem interfered with your normal social activities with family, friends, neighbors, or groups? Not at all    During the past week, to what extent has your arm, shoulder or hand problem limited your work or other regular daily activities Not at all    Arm, shoulder, or hand pain. None    Tingling (pins and needles) in your arm, shoulder, or hand None    Difficulty Sleeping Mild difficulty    DASH Score 9.09 %             OBSERVATIONS: Incision appears to be healing well with some scabby areas still present. Drain sites have healed nicely. Some mild edema present on left lateral chest. No cording present. Left scapula with little to no movement. Palpable tightness present left rhomboid and upper trap region. Issued compression foam in soft stockinette for pt to apply to lateral trunk under bra to reduce pain. She reported relief.  POSTURE:  Forward head and rounded shoulders posture  LYMPHEDEMA ASSESSMENT:   UPPER EXTREMITY AROM/PROM:   A/PROM RIGHT   eval    Shoulder extension 53  Shoulder flexion 145  Shoulder abduction 146  Shoulder internal rotation 78  Shoulder external rotation 84                          (Blank rows = not tested)   A/PROM LEFT   eval LEFT 02/27/2022 LEFT 03/16/2022 LEFT 03/27/2022 LEFT 04/04/2022  Shoulder extension 53 40 47 54   Shoulder flexion 141 100 151 155 155  Shoulder abduction 160 94 135 147 160  Shoulder internal rotation 57 58 75 75   Shoulder external rotation 72 62 90 92                           (Blank rows = not tested)     CERVICAL AROM: All within normal limits   UPPER EXTREMITY STRENGTH: WNL     LYMPHEDEMA ASSESSMENTS:    LANDMARK RIGHT   eval RIGHT 02/27/2022  10 cm proximal to olecranon process 27.8 26.5  Olecranon process 25.3 24.4  10 cm proximal to ulnar  styloid process 21.1 20.4  Just proximal to ulnar styloid process 16 15.7  Across hand at thumb web space 18.2 18.2  At base of 2nd digit 6.2 6.1  (Blank rows = not tested)   LANDMARK LEFT   eval LEFT 02/27/2022  10 cm proximal to olecranon process 28 27  Olecranon process _0 cm proximal to ulnar styloid process 20.7 20.5  Just proximal to ulnar styloid process 15.5 15.7  Across hand at thumb web space 17.5 17  At base of 2nd digit 5.9 6  (Blank rows = not tested)    Surgery type/Date: Left mastectomy with reconstruction and sentinel node biopsy 01/31/2022 Number of lymph nodes removed: 1 Current/past treatment (chemo, radiation, hormone therapy): possibly radiation Other symptoms:  Heaviness/tightness Yes Pain Yes Pitting edema No Infections No Decreased scar mobility Yes Stemmer sign No  TREATMENT TODAY  04/04/2022  Soft tissue mobilization to left UT, pectorals, lateral trunk and SL to left UT and scapular area with cocoa butter. AROM bilateral shoulder flexion, scaption, horizontal abd, Snow angels x 5 ea LTR x 5 with arms in goal post Wall slides x 5 flexion and abduction Scapular retraction and shoulder extension with yellow x 10 ea Measured ROM Pt completed quick dash. Pt given T shirt in case it is her last visit 03/29/2022 Soft tissue mobilization to left UT, pectorals, lateral trunk and SL to left UT and scapular area with cocoa butter. Supine Wand flexion and scaption x 3 ea PROM left shoulder flexion, scaption, abd, IR and ER with VC's to relax Snow angels x 5 LTR x 5 with arms in goal post position Supine scapular series all x 5 reps, VC's throughout. TC's for Sword LEFT PECTORAL STRETCH X 3 IN DOORWAY 3x15 sec 03/27/2022 Soft tissue mobilization to left UT, pectorals, lateral trunk and SL to left UT and scapular area with cocoa butter. Supine Wand flexion and scaption x 5 ea PROM left shoulder flexion, scaption, abd, IR and ER with VC's to  relax LTR x 5 with arms in goal post position Wall slides x 5 for abduction Measured ROM   03/21/2022 Wall slides x 7, Pec wall stretch single arm x 3, Lat stretch x4 standing Soft tissue mobilization to left UT, pectorals, lateral trunk and SL to left UT and scapular area with cocoa butter. PROM left shoulder flexion, scaption, abd, IR and ER with VC's to relax  Left scapular mobs in right SL, protraction retraction, elevation depression Snow angels 2 x 5  Supine scapular series: flexion and ER yellow x 10 Standing shoulder flexion and abduction x 10, reach to high shelf with 1# x 5 03/20/2022 Soft tissue mobilization to left UT, pectorals, lateral trunk and SL to left UT and scapular area with cocoa butter. supine AA shoulder flexion x 3 and scaption with wand x 3 narrower and wider grip ea, 10 sec hold PROM left shoulder flexion, scaption, abd, IR and ER with VC's to relax AROM supine bilateral shoulder flexion, scaption, abduction, horizontal abd x 5 Scapular Retraction with Resistance  - yellow x 10  - Scapular Retraction with Resistance Advanced  - 10 reps - Shoulder External Rotation and Scapular Retraction with Resistance  - 10 reps - Corner Pec Major Stretch  - 2 x daily - 7 x weekly - 1 sets - 3 reps - 15-30 hold - Single Arm Doorway Pec Stretch at 90 Degrees Abduction  -  2 x daily - 7 x week Updated HEP 03/16/2022 Assessed incision; mild redness remains but does not appear infected. Appears like a resolving rash around lower breast incision Pulleys x 2 min flex, 1 min scaption and  54mn abduction,  Soft tissue mobilization to left UT, pectorals, lateral trunk and SL to left UT and scapular area with cocoa butter. Instructed supine AA shoulder flexion and scaption with wand x 3 ea PROM left shoulder flexion, scaption, abd, IR and ER with VC's to relax MFR to left axillary area of cording Measured ROM AROM standing bilateral flexion and scaption x 6  ea   03/13/2022 Pulleys x 2 min flex, 1 min scaption and  219m abduction, ball rolls on wall  x 5 flexion and abd Soft tissue mobilization to left UT, pectorals, lateral trunk and SL to left UT and scapular area with cocoa butter. Instructed supine AA shoulder flexion and scaption with wand x 5 ea PROM left shoulder flexion, scaption, abd, IR and ER with VC's to relax MFR to left axillary area of cording AROM bilateral shoulder flexion, scaption, horizontal abduction    PATIENT EDUCATION: 03/29/2022; Supine Scapular series yellow x 5   Education details: HEP - closed chain flexion and previously issued HEPAccess Code: [email or web address] URL: [email or web address] Date: 03/20/2022 Prepared by: RoCheral AlmasExercises - Scapular Retraction with Resistance  - 1 x daily - 3 x weekly - 1 sets - 10 reps - Scapular Retraction with Resistance Advanced  - 1 x daily - 3 x weekly - 1 sets - 10 reps - Shoulder External Rotation and Scapular Retraction with Resistance  - 1 x daily - 3 x weekly - 1 sets - 10 reps - Corner Pec Major Stretch  - 2 x daily - 7 x weekly - 1 sets - 3 reps - 15-30 hold - Single Arm Doorway Pec Stretch at 90 Degrees Abduction  - 2 x daily - 7 x weekly - 1 sets - 3 reps - 15-30 hold, Person educated: Patient Education method: Explanation, Demonstration, and Handouts Education comprehension: verbalized understanding and returned demonstration  HOME EXERCISE PROGRAM: Reviewed previously given post op HEP and gave closed chain shoulder flexion.  ASSESSMENT:  CLINICAL IMPRESSION:  Pt has fully achieved 4/5 goals established at initial evaluation. She is one point from meeting her quick dash goal. She has made excellent improvement overall and should continue to do well. She is pending a nipple resection on Thursday. She will speak with the MD about when she may resume exercises. She will contact me to let me know if she feels ready to be discharged, or feels she will  require further therapy after the next surgery.  perform skilled therapeutic intervention to improve on the following deficits: Decreased knowledge of precautions, impaired UE functional use, pain, decreased ROM, postural dysfunction.   PT treatment/interventions: ADL/Self care home management, Therapeutic exercises, Therapeutic activity, Patient/Family education, Self Care, Joint mobilization, Dry Needling, Spinal mobilization, Manual lymph drainage, scar mobilization, Manual therapy, and Re-evaluation   GOALS: Goals reviewed with patient? Yes  LONG TERM GOALS:  (STG=LTG)  GOALS Name Target Date  Goal status  1 Pt will demonstrate she has regained full shoulder ROM and function post operatively compared to baselines.  Baseline: 05/08/2022 MET 04/04/2022  2 Patient will demonstrate she has increased left shoulder flexion to >/= 140 degrees to reach overhead. 05/02/2022 MET  03/16/2022  3 Patient will increase left shoulder abduction to >/= 140 degrees to achieve radiation positioning. 05/02/2022 MET  03/27/2022  4 Patient will improve her DASH score to be </= 8 for improved overall UE function. 05/08/2022 In progress  5 Patient will report pain </= 2/10 with most of her daily activities to allow for better sleep and tolerate normal tasks. 03/27/2022 MET     PLAN:  PT FREQUENCY/DURATION: 2x/week for 4 weeks  PLAN FOR NEXT SESSION: Reassess after next surgery prn, or DC,PROM, AAROM exercises, consider wand, STM prn,MFR to axilla, Surgery 04/06/2022   [facility] [id number]  [phone]  After Breast Cancer Class It is recommended you attend the ABC class to be educated on lymphedema risk reduction. This class is free of charge and lasts for 1 hour. It is a 1-time class. You will need to download the Webex app either on your phone or computer. We will send you a link the night before or the morning of the class. You should be able to click on that link to join the class. This is not a  confidential class. You don't have to turn your camera on, but other participants may be able to see your email address. You are scheduled for November 20th at 12:00.  Scar massage You can begin gentle scar massage to you incision sites. Gently place one hand on the incision and move the skin (without sliding on the skin) in various directions. Do this for a few minutes and then you can gently massage either coconut oil or vitamin E cream into the scars.  Compression garment You should continue wearing your compression bra until you feel like you no longer have swelling.  Home exercise Program Continue doing the exercises you were given until you feel like you can do them without feeling any tightness at the end.   Walking Program Studies show that 30 minutes of walking per day (fast enough to elevate your heart rate) can significantly reduce the risk of a cancer recurrence. If you can't walk due to other medical reasons, we encourage you to find another activity you could do (like a stationary bike or water exercise).  Posture After breast cancer surgery, people frequently sit with rounded shoulders posture because it puts their incisions on slack and feels better. If you sit like this and scar tissue forms in that position, you can become very tight and have pain sitting or standing with good posture. Try to be aware of your posture and sit and stand up tall to heal properly.  Follow up PT: It is recommended you return every 3 months for the first 3 years following surgery to be assessed on the SOZO machine for an L-Dex score. This helps prevent clinically significant lymphedema in 95% of patients. These follow up screens are 10 minute appointments that you are not billed for.  Cheral Almas, PT 04/04/22 12:57 PM

## 2022-04-06 ENCOUNTER — Ambulatory Visit (HOSPITAL_COMMUNITY)
Admission: RE | Admit: 2022-04-06 | Discharge: 2022-04-06 | Disposition: A | Discharge status: Home / Self Care | Payer: 59 | Attending: General Surgery | Admitting: General Surgery

## 2022-04-06 ENCOUNTER — Ambulatory Visit (HOSPITAL_BASED_OUTPATIENT_CLINIC_OR_DEPARTMENT_OTHER): Payer: 59 | Admitting: Registered Nurse

## 2022-04-06 ENCOUNTER — Other Ambulatory Visit: Payer: Self-pay

## 2022-04-06 ENCOUNTER — Encounter (HOSPITAL_COMMUNITY): Payer: Self-pay | Admitting: General Surgery

## 2022-04-06 ENCOUNTER — Ambulatory Visit (HOSPITAL_COMMUNITY): Payer: 59 | Admitting: Registered Nurse

## 2022-04-06 ENCOUNTER — Encounter (HOSPITAL_COMMUNITY)
Admission: RE | Disposition: A | Discharge status: Home / Self Care | Payer: Self-pay | Source: Home / Self Care | Attending: General Surgery

## 2022-04-06 DIAGNOSIS — Z01818 Encounter for other preprocedural examination: Secondary | ICD-10-CM

## 2022-04-06 DIAGNOSIS — C50912 Malignant neoplasm of unspecified site of left female breast: Secondary | ICD-10-CM

## 2022-04-06 DIAGNOSIS — Z170 Estrogen receptor positive status [ER+]: Secondary | ICD-10-CM | POA: Insufficient documentation

## 2022-04-06 DIAGNOSIS — Z8719 Personal history of other diseases of the digestive system: Secondary | ICD-10-CM | POA: Diagnosis not present

## 2022-04-06 DIAGNOSIS — C50012 Malignant neoplasm of nipple and areola, left female breast: Secondary | ICD-10-CM | POA: Diagnosis present

## 2022-04-06 HISTORY — PX: BREAST CYST EXCISION: SHX579

## 2022-04-06 LAB — POCT PREGNANCY, URINE: Preg Test, Ur: NEGATIVE

## 2022-04-06 SURGERY — EXCISION, CYST, BREAST
Anesthesia: General | Site: Breast | Laterality: Left

## 2022-04-06 MED ORDER — MIDAZOLAM HCL 2 MG/2ML IJ SOLN
INTRAMUSCULAR | Status: DC | PRN
  Administered 2022-04-06: 1 mg via INTRAVENOUS

## 2022-04-06 MED ORDER — GLYCOPYRROLATE PF 0.2 MG/ML IJ SOSY
PREFILLED_SYRINGE | INTRAMUSCULAR | Status: DC | PRN
  Administered 2022-04-06: .1 mg via INTRAVENOUS

## 2022-04-06 MED ORDER — ACETAMINOPHEN 500 MG PO TABS
ORAL_TABLET | ORAL | Status: AC
  Administered 2022-04-06: 1000 mg via ORAL
  Filled 2022-04-06: qty 2

## 2022-04-06 MED ORDER — 0.9 % SODIUM CHLORIDE (POUR BTL) OPTIME
TOPICAL | Status: DC | PRN
  Administered 2022-04-06: 1000 mL

## 2022-04-06 MED ORDER — CEFAZOLIN SODIUM-DEXTROSE 2-4 GM/100ML-% IV SOLN
INTRAVENOUS | Status: AC
  Filled 2022-04-06: qty 100

## 2022-04-06 MED ORDER — PHENYLEPHRINE 80 MCG/ML (10ML) SYRINGE FOR IV PUSH (FOR BLOOD PRESSURE SUPPORT)
PREFILLED_SYRINGE | INTRAVENOUS | Status: DC | PRN
  Administered 2022-04-06 (×2): 160 ug via INTRAVENOUS

## 2022-04-06 MED ORDER — DEXAMETHASONE SODIUM PHOSPHATE 10 MG/ML IJ SOLN
INTRAMUSCULAR | Status: AC
  Filled 2022-04-06: qty 1

## 2022-04-06 MED ORDER — FENTANYL CITRATE (PF) 250 MCG/5ML IJ SOLN
INTRAMUSCULAR | Status: DC | PRN
  Administered 2022-04-06: 50 ug via INTRAVENOUS

## 2022-04-06 MED ORDER — FENTANYL CITRATE (PF) 250 MCG/5ML IJ SOLN
INTRAMUSCULAR | Status: AC
  Filled 2022-04-06: qty 5

## 2022-04-06 MED ORDER — EPHEDRINE 5 MG/ML INJ
INTRAVENOUS | Status: AC
  Filled 2022-04-06: qty 5

## 2022-04-06 MED ORDER — GLYCOPYRROLATE PF 0.2 MG/ML IJ SOSY
PREFILLED_SYRINGE | INTRAMUSCULAR | Status: AC
  Filled 2022-04-06: qty 1

## 2022-04-06 MED ORDER — LACTATED RINGERS IV SOLN: INTRAVENOUS | Status: DC

## 2022-04-06 MED ORDER — ARTIFICIAL TEARS OPHTHALMIC OINT
TOPICAL_OINTMENT | OPHTHALMIC | Status: AC
  Filled 2022-04-06: qty 3.5

## 2022-04-06 MED ORDER — KETOROLAC TROMETHAMINE 15 MG/ML IJ SOLN
15.0000 mg | INTRAMUSCULAR | Status: AC
  Administered 2022-04-06: 15 mg via INTRAVENOUS

## 2022-04-06 MED ORDER — LIDOCAINE 2% (20 MG/ML) 5 ML SYRINGE
INTRAMUSCULAR | Status: DC | PRN
  Administered 2022-04-06: 60 mg via INTRAVENOUS

## 2022-04-06 MED ORDER — CEFAZOLIN SODIUM-DEXTROSE 2-4 GM/100ML-% IV SOLN
2.0000 g | INTRAVENOUS | Status: AC
  Administered 2022-04-06: 2 g via INTRAVENOUS

## 2022-04-06 MED ORDER — BUPIVACAINE-EPINEPHRINE (PF) 0.25% -1:200000 IJ SOLN
INTRAMUSCULAR | Status: AC
  Filled 2022-04-06: qty 30

## 2022-04-06 MED ORDER — MIDAZOLAM HCL 2 MG/2ML IJ SOLN
INTRAMUSCULAR | Status: AC
  Filled 2022-04-06: qty 2

## 2022-04-06 MED ORDER — DEXAMETHASONE SODIUM PHOSPHATE 10 MG/ML IJ SOLN
INTRAMUSCULAR | Status: DC | PRN
  Administered 2022-04-06: 10 mg via INTRAVENOUS

## 2022-04-06 MED ORDER — CHLORHEXIDINE GLUCONATE CLOTH 2 % EX PADS: 6.0000 | MEDICATED_PAD | Freq: Once | CUTANEOUS | Status: DC

## 2022-04-06 MED ORDER — ONDANSETRON HCL 4 MG/2ML IJ SOLN
INTRAMUSCULAR | Status: DC | PRN
  Administered 2022-04-06: 4 mg via INTRAVENOUS

## 2022-04-06 MED ORDER — ENSURE PRE-SURGERY PO LIQD: 296.0000 mL | Freq: Once | ORAL | Status: DC

## 2022-04-06 MED ORDER — CHLORHEXIDINE GLUCONATE 0.12 % MT SOLN
OROMUCOSAL | Status: AC
  Administered 2022-04-06: 15 mL via OROMUCOSAL
  Filled 2022-04-06: qty 15

## 2022-04-06 MED ORDER — EPHEDRINE SULFATE-NACL 50-0.9 MG/10ML-% IV SOSY
PREFILLED_SYRINGE | INTRAVENOUS | Status: DC | PRN
  Administered 2022-04-06 (×2): 5 mg via INTRAVENOUS

## 2022-04-06 MED ORDER — PHENYLEPHRINE 80 MCG/ML (10ML) SYRINGE FOR IV PUSH (FOR BLOOD PRESSURE SUPPORT)
PREFILLED_SYRINGE | INTRAVENOUS | Status: AC
  Filled 2022-04-06: qty 10

## 2022-04-06 MED ORDER — ONDANSETRON HCL 4 MG/2ML IJ SOLN
INTRAMUSCULAR | Status: AC
  Filled 2022-04-06: qty 2

## 2022-04-06 MED ORDER — ACETAMINOPHEN 500 MG PO TABS: 1000.0000 mg | ORAL_TABLET | ORAL | Status: AC

## 2022-04-06 MED ORDER — PROPOFOL 10 MG/ML IV BOLUS
INTRAVENOUS | Status: DC | PRN
  Administered 2022-04-06: 150 mg via INTRAVENOUS

## 2022-04-06 MED ORDER — ORAL CARE MOUTH RINSE: 15.0000 mL | Freq: Once | OROMUCOSAL | Status: AC

## 2022-04-06 MED ORDER — BUPIVACAINE-EPINEPHRINE 0.25% -1:200000 IJ SOLN
INTRAMUSCULAR | Status: DC | PRN
  Administered 2022-04-06: 1.5 mL

## 2022-04-06 MED ORDER — KETOROLAC TROMETHAMINE 15 MG/ML IJ SOLN
INTRAMUSCULAR | Status: AC
  Filled 2022-04-06: qty 1

## 2022-04-06 MED ORDER — LIDOCAINE 2% (20 MG/ML) 5 ML SYRINGE
INTRAMUSCULAR | Status: AC
  Filled 2022-04-06: qty 5

## 2022-04-06 MED ORDER — CHLORHEXIDINE GLUCONATE 0.12 % MT SOLN: 15.0000 mL | Freq: Once | OROMUCOSAL | Status: AC

## 2022-04-06 SURGICAL SUPPLY — 30 items
BAG COUNTER SPONGE SURGICOUNT (BAG) ×1 IMPLANT
BENZOIN TINCTURE PRP APPL 2/3 (GAUZE/BANDAGES/DRESSINGS) ×1 IMPLANT
CANISTER SUCT 3000ML PPV (MISCELLANEOUS) IMPLANT
CHLORAPREP W/TINT 26 (MISCELLANEOUS) ×1 IMPLANT
CNTNR URN SCR LID CUP LEK RST (MISCELLANEOUS) IMPLANT
CONT SPEC 4OZ STRL OR WHT (MISCELLANEOUS) ×1
COVER SURGICAL LIGHT HANDLE (MISCELLANEOUS) ×1 IMPLANT
DERMABOND ADVANCED .7 DNX12 (GAUZE/BANDAGES/DRESSINGS) ×1 IMPLANT
DRAPE CHEST BREAST 15X10 FENES (DRAPES) ×1 IMPLANT
ELECT CAUTERY BLADE 6.4 (BLADE) ×1 IMPLANT
ELECT REM PT RETURN 9FT ADLT (ELECTROSURGICAL) ×1
ELECTRODE REM PT RTRN 9FT ADLT (ELECTROSURGICAL) ×1 IMPLANT
GLOVE BIO SURGEON STRL SZ7 (GLOVE) ×1 IMPLANT
GLOVE BIOGEL PI IND STRL 7.5 (GLOVE) ×1 IMPLANT
GOWN STRL REUS W/ TWL LRG LVL3 (GOWN DISPOSABLE) ×2 IMPLANT
GOWN STRL REUS W/TWL LRG LVL3 (GOWN DISPOSABLE) ×2
KIT BASIN OR (CUSTOM PROCEDURE TRAY) ×1 IMPLANT
KIT TURNOVER KIT B (KITS) ×1 IMPLANT
NDL HYPO 25GX1X1/2 BEV (NEEDLE) ×1 IMPLANT
NEEDLE HYPO 25GX1X1/2 BEV (NEEDLE) ×1 IMPLANT
NS IRRIG 1000ML POUR BTL (IV SOLUTION) ×1 IMPLANT
PACK GENERAL/GYN (CUSTOM PROCEDURE TRAY) ×1 IMPLANT
PAD ARMBOARD 7.5X6 YLW CONV (MISCELLANEOUS) ×1 IMPLANT
STRIP CLOSURE SKIN 1/2X4 (GAUZE/BANDAGES/DRESSINGS) IMPLANT
SUT ETHILON 3 0 PS 1 (SUTURE) IMPLANT
SUT MNCRL AB 4-0 PS2 18 (SUTURE) ×1 IMPLANT
SUT VIC AB 3-0 SH 27 (SUTURE) ×1
SUT VIC AB 3-0 SH 27XBRD (SUTURE) ×1 IMPLANT
SYR CONTROL 10ML LL (SYRINGE) ×1 IMPLANT
TOWEL GREEN STERILE (TOWEL DISPOSABLE) ×1 IMPLANT

## 2022-04-06 NOTE — H&P (Signed)
Samantha Pena is an 56 y.o. female.   Chief Complaint: breast cancer HPI: 56-year-old female had a screening mammogram as she did not have a mass or discharge. She has C density breast tissue. On her mammogram she was found to have a central upper breast mass on the left side about 2 cm. Her left axillary ultrasound was negative. She underwent a biopsy that was ultrasound-guided. The clip did not sit in the area of the distortion so the distortion has not been fully evaluated. The mass showed a grade 2 invasive ductal carcinoma that is 95% ER positive, 100% PR positive, HER2 negative, and Ki-67 is 10%. Since then she had an mri that showed a 4.7x3.4x3.1 cm area with single node with some CT. Us previously normal. she eventually underwent nsm with sn biopsy and expander placement. Is doing ok from this. Path is a grade II 6 cmm IDC with neg margins although separate nipple biopsy is positive. One node in breast that was positive and one ax sn is positive as well. She has drains out.    Past Medical History:  Diagnosis Date   Anxiety    Cancer ([facility]) 2023   breast   COVID-19    2021   Cyst 01/2011   2016 stable. near rectum that gives pt discomfort when sitting and has grown in size in the past year    History of hiatal hernia    Migraines     Past Surgical History:  Procedure Laterality Date   BREAST RECONSTRUCTION WITH PLACEMENT OF TISSUE EXPANDER AND ALLODERM Left 01/31/2022   Procedure: BREAST RECONSTRUCTION WITH PLACEMENT OF TISSUE EXPANDER AND ALLODERM;  Surgeon: Thimmappa, Brinda, MD;  Location: [facility];  Service: Plastics;  Laterality: Left;   CERVICAL POLYPECTOMY  02/21/2021   uterus   CESAREAN SECTION  03/18/1999   FOOT SURGERY Right 09/30/2002   bone in toe had to be shaved and realigned    LAPAROSCOPIC APPENDECTOMY N/A 10/08/2020   Procedure: APPENDECTOMY LAPAROSCOPIC;  Surgeon: Wakefield, Matthew, MD;  Location: [facility];  Service: General;  Laterality: N/A;   NIPPLE SPARING  MASTECTOMY WITH SENTINEL LYMPH NODE BIOPSY Left 01/31/2022   Procedure: LEFT NIPPLE SPARING MASTECTOMY WITH AXILLARY SENTINEL LYMPH NODE BIOPSY;  Surgeon: Wakefield, Matthew, MD;  Location: [facility];  Service: General;  Laterality: Left;    Family History  Problem Relation Age of Onset   Cancer Mother 76       breast cancer - passed from   Breast cancer Mother 72   Diabetes Father        50s   Hypertension Father    Breast cancer Sister 52   Epilepsy Sister    Hyperlipidemia Brother    Colon cancer Neg Hx    Esophageal cancer Neg Hx    Rectal cancer Neg Hx    Stomach cancer Neg Hx    Social History:  reports that she has never smoked. She has never used smokeless tobacco. She reports that she does not currently use alcohol. She reports that she does not use drugs.  Allergies: No Known Allergies  Medications Prior to Admission  Medication Sig Dispense Refill   ALPRAZolam (XANAX) 0.25 MG tablet Take 0.25 mg by mouth every 8 (eight) hours as needed for anxiety.     Ascorbic Acid (VITAMIN C) 1000 MG tablet Take 1,000 mg by mouth daily as needed (immune support).     Calcium Carb-Cholecalciferol (CALTRATE 600+D3 SOFT) 600-20 MG-MCG CHEW Chew 1 tablet by mouth in   the morning.     fluticasone (FLONASE) 50 MCG/ACT nasal spray Place 2 sprays into both nostrils in the morning.     ibuprofen (ADVIL) 200 MG tablet Take 400 mg by mouth every 6 (six) hours as needed for moderate pain.     Multiple Vitamin (MULTIVITAMIN WITH MINERALS) TABS tablet Take 1 tablet by mouth in the morning. Women's Multivitamin     SUMAtriptan (IMITREX) 20 MG/ACT nasal spray USE 1 SPRAY INTO EACH NOSTRIL EVERY 2 HOURS AS NEEDED (max twice per day) 6 each 11   tamoxifen (NOLVADEX) 10 MG tablet TAKE 1 TABLET BY MOUTH EVERY DAY 90 tablet 1   zinc gluconate 50 MG tablet Take 50 mg by mouth daily.     tretinoin (RETIN-A) 0.025 % cream Apply 1 application  topically daily as needed (acne/skin blemishes).     zolpidem (AMBIEN)  10 MG tablet Take 10 mg by mouth at bedtime as needed for sleep.      No results found for this or any previous visit (from the past 48 hour(s)). No results found.  Review of Systems  All other systems reviewed and are negative.   Blood pressure 115/69, pulse 76, temperature 98.4 F (36.9 C), temperature source Oral, resp. rate 18, height 5' 6" (1.676 m), weight 68 kg, last menstrual period 02/12/2022, SpO2 100 %. Physical Exam   General nad Cv regular Pulm effort normal Left breast skin and nac viable, ax incision without infection, arm rom limited  Assessment/Plan Excision of left nipple  We discussed nipple excision as well as possible alnd.she has decided not to undergo chemotherapy based on oncotype etc.  Will plan to just excise left nipple and do postop radiotherapy   Matthew Wakefield, MD 04/06/2022, 7:08 AM    

## 2022-04-06 NOTE — Anesthesia Preprocedure Evaluation (Signed)
Anesthesia Evaluation  Patient identified by MRN, date of birth, ID band Patient awake    Reviewed: Allergy & Precautions, H&P , NPO status , Patient's Chart, lab work & pertinent test results  Airway Mallampati: II   Neck ROM: full    Dental   Pulmonary neg pulmonary ROS   breath sounds clear to auscultation       Cardiovascular negative cardio ROS  Rhythm:regular Rate:Normal     Neuro/Psych  Headaches PSYCHIATRIC DISORDERS Anxiety        GI/Hepatic hiatal hernia,,,  Endo/Other    Renal/GU      Musculoskeletal   Abdominal   Peds  Hematology   Anesthesia Other Findings   Reproductive/Obstetrics                             Anesthesia Physical Anesthesia Plan  ASA: 2  Anesthesia Plan: General   Post-op Pain Management:    Induction: Intravenous  PONV Risk Score and Plan: 3 and Ondansetron, Dexamethasone, Midazolam and Treatment may vary due to age or medical condition  Airway Management Planned: LMA  Additional Equipment:   Intra-op Plan:   Post-operative Plan: Extubation in OR  Informed Consent: I have reviewed the patients History and Physical, chart, labs and discussed the procedure including the risks, benefits and alternatives for the proposed anesthesia with the patient or authorized representative who has indicated his/her understanding and acceptance.     Dental advisory given  Plan Discussed with: CRNA, Anesthesiologist and Surgeon  Anesthesia Plan Comments:        Anesthesia Quick Evaluation

## 2022-04-06 NOTE — Transfer of Care (Signed)
Immediate Anesthesia Transfer of Care Note  Patient: Samantha Pena  Procedure(s) Performed: EXCISION OF LEFT NIPPLE (Left: Breast)  Patient Location: PACU  Anesthesia Type:General  Level of Consciousness: drowsy and patient cooperative  Airway & Oxygen Therapy: Patient connected to face mask oxygen  Post-op Assessment: Report given to RN and Post -op Vital signs reviewed and stable  Post vital signs: Reviewed and stable  Last Vitals:  Vitals Value Taken Time  BP 118/74 04/06/22 0806  Temp    Pulse 83 04/06/22 0808  Resp 13 04/06/22 0808  SpO2 100 % 04/06/22 0808  Vitals shown include unvalidated device data.  Last Pain:  Vitals:   04/06/22 0627  TempSrc:   PainSc: 0-No pain         Complications: There were no known notable events for this encounter.

## 2022-04-06 NOTE — Anesthesia Procedure Notes (Addendum)
Procedure Name: LMA Insertion Date/Time: 04/06/2022 7:33 AM  Performed by: Ester Rink, CRNAPre-anesthesia Checklist: Patient identified, Emergency Drugs available, Suction available and Patient being monitored Patient Re-evaluated:Patient Re-evaluated prior to induction Oxygen Delivery Method: Circle system utilized Preoxygenation: Pre-oxygenation with 100% oxygen Induction Type: IV induction LMA: LMA inserted LMA Size: 4.0 Tube type: Oral Number of attempts: 1 Placement Confirmation: positive ETCO2 and breath sounds checked- equal and bilateral Tube secured with: Tape Dental Injury: Teeth and Oropharynx as per pre-operative assessment

## 2022-04-06 NOTE — Op Note (Signed)
Preoperative diagnosis: positive left nipple biopsy after nsm Postoperative diagnosis: saa Procedure: excision of left nipple Surgeon: Dr Serita Grammes Anesthesia general Specimens left nipple Complications none Drains none Sponge and needle count correct Dispo recovery stable.  Indications: This is a 3 yof s/p left nsm with a positive left nipple biopsy. We discussed returning for left nipple excision  Procedure: After informed consent obtained she was taken to the OR.  Antibiotics were given. SCDs were in place.  She was prepped and draped in a standard sterile surgical fashion.  A surgical timeout  was performed.   The nipple was excised using a 15 blade.  The nipple is larger compared to the areola.  I removed this and passed it off the table as a specimen.  I then closed some of the deep tissue with 3-0 Vicryl suture.  I did not violate the expander.  I then closed the skin with a 4-0 Monocryl.  I did place an additional 3-0 nylon just to ensure that the expander would not get exposed. She tolerated this well and was transferred to pacu stable.

## 2022-04-06 NOTE — Interval H&P Note (Signed)
History and Physical Interval Note:  04/06/2022 7:10 AM  Samantha Pena  has presented today for surgery, with the diagnosis of LEFT BREAST CANCER.  The various methods of treatment have been discussed with the patient and family. After consideration of risks, benefits and other options for treatment, the patient has consented to  Procedure(s): EXCISION OF LEFT NIPPLE (Left) as a surgical intervention.  The patient's history has been reviewed, patient examined, no change in status, stable for surgery.  I have reviewed the patient's chart and labs.  Questions were answered to the patient's satisfaction.     Rolm Bookbinder

## 2022-04-06 NOTE — Discharge Instructions (Signed)
[facility] Office Phone Number [phone]  POST OP INSTRUCTIONS Take 400 mg of ibuprofen every 8 hours or 650 mg tylenol every 6 hours for next 72 hours then as needed. Use ice several times daily also.  A prescription for pain medication may be given to you upon discharge.  Take your pain medication as prescribed, if needed.  If narcotic pain medicine is not needed, then you may take acetaminophen (Tylenol), naprosyn (Alleve) or ibuprofen (Advil) as needed. Take your usually prescribed medications unless otherwise directed If you need a refill on your pain medication, please contact your pharmacy.  They will contact our office to request authorization.  Prescriptions will not be filled after 5pm or on week-ends. You should eat very light the first 24 hours after surgery, such as soup, crackers, pudding, etc.  Resume your normal diet the day after surgery. Most patients will experience some swelling and bruising in the breast.  Ice packs and a good support bra will help.  Wear the breast binder provided or a sports bra for 72 hours day and night.  After that wear a sports bra during the day until you return to the office. Swelling and bruising can take several days to resolve.  It is common to experience some constipation if taking pain medication after surgery.  Increasing fluid intake and taking a stool softener will usually help or prevent this problem from occurring.  A mild laxative (Milk of Magnesia or Miralax) should be taken according to package directions if there are no bowel movements after 48 hours. I used skin glue on the incision, you may shower in 24 hours.  The glue will flake off over the next 2-3 weeks.  Any sutures or staples will be removed at the office during your follow-up visit. ACTIVITIES:  You may resume regular daily activities (gradually increasing) beginning the next day.  Wearing a good support bra or sports bra minimizes pain and swelling.  You may have  sexual intercourse when it is comfortable. You may drive when you no longer are taking prescription pain medication, you can comfortably wear a seatbelt, and you can safely maneuver your car and apply brakes. RETURN TO WORK:  ______________________________________________________________________________________ You should see your doctor in the office for a follow-up appointment approximately two weeks after your surgery.  Your doctor's nurse will typically make your follow-up appointment when she calls you with your pathology report.  Expect your pathology report 3-4 business days after your surgery.  You may call to check if you do not hear from us after three days. OTHER INSTRUCTIONS: _______________________________________________________________________________________________ _____________________________________________________________________________________________________________________________________ _____________________________________________________________________________________________________________________________________ _____________________________________________________________________________________________________________________________________  WHEN TO CALL DR Paullette Mckain: Fever over 101.0 Nausea and/or vomiting. Extreme swelling or bruising. Continued bleeding from incision. Increased pain, redness, or drainage from the incision.  The clinic staff is available to answer your questions during regular business hours.  Please don't hesitate to call and ask to speak to one of the nurses for clinical concerns.  If you have a medical emergency, go to the nearest emergency room or call 911.  A surgeon from [facility] is always on call at the hospital.  For further questions, please visit [email or web address]  

## 2022-04-06 NOTE — H&P (View-Only) (Signed)
Samantha Pena is an 56 y.o. female.   Chief Complaint: breast cancer HPI: 56-year-old female had a screening mammogram as she did not have a mass or discharge. She has C density breast tissue. On her mammogram she was found to have a central upper breast mass on the left side about 2 cm. Her left axillary ultrasound was negative. She underwent a biopsy that was ultrasound-guided. The clip did not sit in the area of the distortion so the distortion has not been fully evaluated. The mass showed a grade 2 invasive ductal carcinoma that is 95% ER positive, 100% PR positive, HER2 negative, and Ki-67 is 10%. Since then she had an mri that showed a 4.7x3.4x3.1 cm area with single node with some CT. Us previously normal. she eventually underwent nsm with sn biopsy and expander placement. Is doing ok from this. Path is a grade II 6 cmm IDC with neg margins although separate nipple biopsy is positive. One node in breast that was positive and one ax sn is positive as well. She has drains out.    Past Medical History:  Diagnosis Date   Anxiety    Cancer (HCC) 2023   breast   COVID-19    2021   Cyst 01/2011   2016 stable. near rectum that gives pt discomfort when sitting and has grown in size in the past year    History of hiatal hernia    Migraines     Past Surgical History:  Procedure Laterality Date   BREAST RECONSTRUCTION WITH PLACEMENT OF TISSUE EXPANDER AND ALLODERM Left 01/31/2022   Procedure: BREAST RECONSTRUCTION WITH PLACEMENT OF TISSUE EXPANDER AND ALLODERM;  Surgeon: Thimmappa, Brinda, MD;  Location: [facility] OR;  Service: Plastics;  Laterality: Left;   CERVICAL POLYPECTOMY  02/21/2021   uterus   CESAREAN SECTION  03/18/1999   FOOT SURGERY Right 09/30/2002   bone in toe had to be shaved and realigned    LAPAROSCOPIC APPENDECTOMY N/A 10/08/2020   Procedure: APPENDECTOMY LAPAROSCOPIC;  Surgeon: Freddye Cardamone, MD;  Location: [facility] OR;  Service: General;  Laterality: N/A;   NIPPLE SPARING  MASTECTOMY WITH SENTINEL LYMPH NODE BIOPSY Left 01/31/2022   Procedure: LEFT NIPPLE SPARING MASTECTOMY WITH AXILLARY SENTINEL LYMPH NODE BIOPSY;  Surgeon: Lafreda Casebeer, MD;  Location: [facility] OR;  Service: General;  Laterality: Left;    Family History  Problem Relation Age of Onset   Cancer Mother 76       breast cancer - passed from   Breast cancer Mother 72   Diabetes Father        50s   Hypertension Father    Breast cancer Sister 52   Epilepsy Sister    Hyperlipidemia Brother    Colon cancer Neg Hx    Esophageal cancer Neg Hx    Rectal cancer Neg Hx    Stomach cancer Neg Hx    Social History:  reports that she has never smoked. She has never used smokeless tobacco. She reports that she does not currently use alcohol. She reports that she does not use drugs.  Allergies: No Known Allergies  Medications Prior to Admission  Medication Sig Dispense Refill   ALPRAZolam (XANAX) 0.25 MG tablet Take 0.25 mg by mouth every 8 (eight) hours as needed for anxiety.     Ascorbic Acid (VITAMIN C) 1000 MG tablet Take 1,000 mg by mouth daily as needed (immune support).     Calcium Carb-Cholecalciferol (CALTRATE 600+D3 SOFT) 600-20 MG-MCG CHEW Chew 1 tablet by mouth in   the morning.     fluticasone (FLONASE) 50 MCG/ACT nasal spray Place 2 sprays into both nostrils in the morning.     ibuprofen (ADVIL) 200 MG tablet Take 400 mg by mouth every 6 (six) hours as needed for moderate pain.     Multiple Vitamin (MULTIVITAMIN WITH MINERALS) TABS tablet Take 1 tablet by mouth in the morning. Women's Multivitamin     SUMAtriptan (IMITREX) 20 MG/ACT nasal spray USE 1 SPRAY INTO EACH NOSTRIL EVERY 2 HOURS AS NEEDED (max twice per day) 6 each 11   tamoxifen (NOLVADEX) 10 MG tablet TAKE 1 TABLET BY MOUTH EVERY DAY 90 tablet 1   zinc gluconate 50 MG tablet Take 50 mg by mouth daily.     tretinoin (RETIN-A) 0.025 % cream Apply 1 application  topically daily as needed (acne/skin blemishes).     zolpidem (AMBIEN)  10 MG tablet Take 10 mg by mouth at bedtime as needed for sleep.      No results found for this or any previous visit (from the past 48 hour(s)). No results found.  Review of Systems  All other systems reviewed and are negative.   Blood pressure 115/69, pulse 76, temperature 98.4 F (36.9 C), temperature source Oral, resp. rate 18, height 5' 6" (1.676 m), weight 68 kg, last menstrual period 02/12/2022, SpO2 100 %. Physical Exam   General nad Cv regular Pulm effort normal Left breast skin and nac viable, ax incision without infection, arm rom limited  Assessment/Plan Excision of left nipple  We discussed nipple excision as well as possible alnd.she has decided not to undergo chemotherapy based on oncotype etc.  Will plan to just excise left nipple and do postop radiotherapy   Lenix Benoist, MD 04/06/2022, 7:08 AM    

## 2022-04-07 ENCOUNTER — Encounter (HOSPITAL_COMMUNITY): Payer: Self-pay | Admitting: General Surgery

## 2022-04-07 LAB — SURGICAL PATHOLOGY

## 2022-04-07 NOTE — Anesthesia Postprocedure Evaluation (Signed)
Anesthesia Post Note  Patient: Samantha Pena  Procedure(s) Performed: EXCISION OF LEFT NIPPLE (Left: Breast)     Patient location during evaluation: PACU Anesthesia Type: General Level of consciousness: awake and alert Pain management: pain level controlled Vital Signs Assessment: post-procedure vital signs reviewed and stable Respiratory status: spontaneous breathing, nonlabored ventilation, respiratory function stable and patient connected to nasal cannula oxygen Cardiovascular status: blood pressure returned to baseline and stable Postop Assessment: no apparent nausea or vomiting Anesthetic complications: no   There were no known notable events for this encounter.  Last Vitals:  Vitals:   04/06/22 0842 04/06/22 0845  BP: 116/76   Pulse: 63 62  Resp: 10 15  Temp: 36.4 C   SpO2: 100% 100%    Last Pain:  Vitals:   04/06/22 0807  TempSrc:   PainSc: 0-No pain                 Penny Frisbie S

## 2022-04-10 ENCOUNTER — Telehealth: Payer: Self-pay | Admitting: Hematology and Oncology

## 2022-04-10 NOTE — Telephone Encounter (Signed)
Rescheduled appointment per provider PAL. Patient is aware of the changes made to her upcoming appointment. 

## 2022-04-12 ENCOUNTER — Other Ambulatory Visit: Payer: Self-pay | Admitting: General Surgery

## 2022-04-13 ENCOUNTER — Encounter: Payer: Self-pay | Admitting: *Deleted

## 2022-04-13 ENCOUNTER — Other Ambulatory Visit: Payer: Self-pay | Admitting: General Surgery

## 2022-04-13 ENCOUNTER — Other Ambulatory Visit: Payer: Self-pay

## 2022-04-13 ENCOUNTER — Encounter (HOSPITAL_COMMUNITY): Payer: Self-pay | Admitting: General Surgery

## 2022-04-13 NOTE — Anesthesia Preprocedure Evaluation (Addendum)
Anesthesia Evaluation  Patient identified by MRN, date of birth, ID band Patient awake    Reviewed: Allergy & Precautions, NPO status , Patient's Chart, lab work & pertinent test results  History of Anesthesia Complications Negative for: history of anesthetic complications  Airway Mallampati: I  TM Distance: >3 FB Neck ROM: Full    Dental  (+) Dental Advisory Given, Teeth Intact   Pulmonary neg pulmonary ROS   breath sounds clear to auscultation       Cardiovascular negative cardio ROS  Rhythm:Regular Rate:Normal     Neuro/Psych  Headaches (migraine last night)  Anxiety        GI/Hepatic Neg liver ROS,GERD  Controlled,,  Endo/Other  negative endocrine ROS    Renal/GU negative Renal ROS     Musculoskeletal   Abdominal   Peds  Hematology negative hematology ROS (+)   Anesthesia Other Findings Breast cancer  Reproductive/Obstetrics                              Anesthesia Physical Anesthesia Plan  ASA: 2  Anesthesia Plan: General   Post-op Pain Management: Tylenol PO (pre-op)*   Induction: Intravenous  PONV Risk Score and Plan: 3 and Ondansetron, Dexamethasone, Midazolam and Treatment may vary due to age or medical condition  Airway Management Planned: LMA  Additional Equipment: None  Intra-op Plan:   Post-operative Plan:   Informed Consent: I have reviewed the patients History and Physical, chart, labs and discussed the procedure including the risks, benefits and alternatives for the proposed anesthesia with the patient or authorized representative who has indicated his/her understanding and acceptance.     Dental advisory given  Plan Discussed with: CRNA and Surgeon  Anesthesia Plan Comments:          Anesthesia Quick Evaluation

## 2022-04-14 ENCOUNTER — Other Ambulatory Visit: Payer: Self-pay

## 2022-04-14 ENCOUNTER — Ambulatory Visit (HOSPITAL_BASED_OUTPATIENT_CLINIC_OR_DEPARTMENT_OTHER): Payer: 59 | Admitting: Anesthesiology

## 2022-04-14 ENCOUNTER — Encounter (HOSPITAL_COMMUNITY)
Admission: RE | Disposition: A | Discharge status: Home / Self Care | Payer: Self-pay | Source: Home / Self Care | Attending: General Surgery

## 2022-04-14 ENCOUNTER — Encounter (HOSPITAL_COMMUNITY): Payer: Self-pay | Admitting: General Surgery

## 2022-04-14 ENCOUNTER — Ambulatory Visit (HOSPITAL_COMMUNITY)
Admission: RE | Admit: 2022-04-14 | Discharge: 2022-04-14 | Disposition: A | Discharge status: Home / Self Care | Payer: 59 | Attending: General Surgery | Admitting: General Surgery

## 2022-04-14 ENCOUNTER — Ambulatory Visit (HOSPITAL_COMMUNITY): Payer: 59 | Admitting: Anesthesiology

## 2022-04-14 DIAGNOSIS — Z803 Family history of malignant neoplasm of breast: Secondary | ICD-10-CM | POA: Diagnosis not present

## 2022-04-14 DIAGNOSIS — Z01818 Encounter for other preprocedural examination: Secondary | ICD-10-CM

## 2022-04-14 DIAGNOSIS — K219 Gastro-esophageal reflux disease without esophagitis: Secondary | ICD-10-CM | POA: Insufficient documentation

## 2022-04-14 DIAGNOSIS — G43919 Migraine, unspecified, intractable, without status migrainosus: Secondary | ICD-10-CM | POA: Insufficient documentation

## 2022-04-14 DIAGNOSIS — Z170 Estrogen receptor positive status [ER+]: Secondary | ICD-10-CM | POA: Diagnosis not present

## 2022-04-14 DIAGNOSIS — F419 Anxiety disorder, unspecified: Secondary | ICD-10-CM | POA: Insufficient documentation

## 2022-04-14 DIAGNOSIS — C50012 Malignant neoplasm of nipple and areola, left female breast: Secondary | ICD-10-CM | POA: Diagnosis present

## 2022-04-14 HISTORY — PX: BREAST LUMPECTOMY: SHX2

## 2022-04-14 LAB — POCT PREGNANCY, URINE: Preg Test, Ur: NEGATIVE

## 2022-04-14 SURGERY — BREAST LUMPECTOMY
Anesthesia: General | Site: Breast | Laterality: Left

## 2022-04-14 MED ORDER — MEPERIDINE HCL 50 MG/ML IJ SOLN: 6.2500 mg | INTRAMUSCULAR | Status: DC | PRN

## 2022-04-14 MED ORDER — ONDANSETRON HCL 4 MG/2ML IJ SOLN
INTRAMUSCULAR | Status: AC
  Filled 2022-04-14: qty 4

## 2022-04-14 MED ORDER — CHLORHEXIDINE GLUCONATE 0.12 % MT SOLN
15.0000 mL | Freq: Once | OROMUCOSAL | Status: AC
  Administered 2022-04-14: 15 mL via OROMUCOSAL

## 2022-04-14 MED ORDER — CEFAZOLIN SODIUM-DEXTROSE 2-4 GM/100ML-% IV SOLN: 2.0000 g | INTRAVENOUS | Status: DC

## 2022-04-14 MED ORDER — DEXAMETHASONE SODIUM PHOSPHATE 4 MG/ML IJ SOLN
INTRAMUSCULAR | Status: DC | PRN
  Administered 2022-04-14: 10 mg via INTRAVENOUS

## 2022-04-14 MED ORDER — CHLORHEXIDINE GLUCONATE CLOTH 2 % EX PADS: 6.0000 | MEDICATED_PAD | Freq: Once | CUTANEOUS | Status: DC

## 2022-04-14 MED ORDER — PROMETHAZINE HCL 25 MG/ML IJ SOLN: 6.2500 mg | INTRAMUSCULAR | Status: DC | PRN

## 2022-04-14 MED ORDER — GLYCOPYRROLATE 0.2 MG/ML IJ SOLN
INTRAMUSCULAR | Status: DC | PRN
  Administered 2022-04-14: .2 mg via INTRAVENOUS

## 2022-04-14 MED ORDER — LIDOCAINE HCL (PF) 2 % IJ SOLN
INTRAMUSCULAR | Status: AC
  Filled 2022-04-14: qty 10

## 2022-04-14 MED ORDER — ENSURE PRE-SURGERY PO LIQD
296.0000 mL | Freq: Once | ORAL | Status: DC
  Filled 2022-04-14: qty 296

## 2022-04-14 MED ORDER — DEXMEDETOMIDINE HCL IN NACL 80 MCG/20ML IV SOLN
INTRAVENOUS | Status: AC
  Filled 2022-04-14: qty 20

## 2022-04-14 MED ORDER — GLYCOPYRROLATE 0.2 MG/ML IJ SOLN
INTRAMUSCULAR | Status: AC
  Filled 2022-04-14: qty 1

## 2022-04-14 MED ORDER — FENTANYL CITRATE (PF) 100 MCG/2ML IJ SOLN
INTRAMUSCULAR | Status: AC
  Filled 2022-04-14: qty 2

## 2022-04-14 MED ORDER — ACETAMINOPHEN 500 MG PO TABS: 1000.0000 mg | ORAL_TABLET | ORAL | Status: DC

## 2022-04-14 MED ORDER — PROPOFOL 10 MG/ML IV BOLUS
INTRAVENOUS | Status: DC | PRN
  Administered 2022-04-14: 200 mg via INTRAVENOUS

## 2022-04-14 MED ORDER — LIDOCAINE HCL (CARDIAC) PF 100 MG/5ML IV SOSY
PREFILLED_SYRINGE | INTRAVENOUS | Status: DC | PRN
  Administered 2022-04-14: 80 mg via INTRAVENOUS

## 2022-04-14 MED ORDER — BUPIVACAINE-EPINEPHRINE 0.25% -1:200000 IJ SOLN
INTRAMUSCULAR | Status: DC | PRN
  Administered 2022-04-14: 1 mL

## 2022-04-14 MED ORDER — ACETAMINOPHEN 500 MG PO TABS: 1000.0000 mg | ORAL_TABLET | Freq: Once | ORAL | Status: DC

## 2022-04-14 MED ORDER — ENSURE PRE-SURGERY PO LIQD: 296.0000 mL | Freq: Once | ORAL | Status: DC

## 2022-04-14 MED ORDER — MIDAZOLAM HCL 5 MG/5ML IJ SOLN
INTRAMUSCULAR | Status: DC | PRN
  Administered 2022-04-14: 2 mg via INTRAVENOUS

## 2022-04-14 MED ORDER — CEFAZOLIN SODIUM-DEXTROSE 2-4 GM/100ML-% IV SOLN
2.0000 g | INTRAVENOUS | Status: AC
  Administered 2022-04-14: 2 g via INTRAVENOUS
  Filled 2022-04-14: qty 100

## 2022-04-14 MED ORDER — ACETAMINOPHEN 500 MG PO TABS
1000.0000 mg | ORAL_TABLET | ORAL | Status: AC
  Administered 2022-04-14: 1000 mg via ORAL
  Filled 2022-04-14: qty 2

## 2022-04-14 MED ORDER — FENTANYL CITRATE (PF) 100 MCG/2ML IJ SOLN
INTRAMUSCULAR | Status: DC | PRN
  Administered 2022-04-14 (×2): 25 ug via INTRAVENOUS
  Administered 2022-04-14: 50 ug via INTRAVENOUS

## 2022-04-14 MED ORDER — PROPOFOL 500 MG/50ML IV EMUL
INTRAVENOUS | Status: AC
  Filled 2022-04-14: qty 50

## 2022-04-14 MED ORDER — HYDROMORPHONE HCL 1 MG/ML IJ SOLN: 0.2500 mg | INTRAMUSCULAR | Status: DC | PRN

## 2022-04-14 MED ORDER — OXYCODONE HCL 5 MG PO TABS: 5.0000 mg | ORAL_TABLET | Freq: Once | ORAL | Status: DC | PRN

## 2022-04-14 MED ORDER — BUPIVACAINE-EPINEPHRINE (PF) 0.5% -1:200000 IJ SOLN
INTRAMUSCULAR | Status: AC
  Filled 2022-04-14: qty 30

## 2022-04-14 MED ORDER — DEXAMETHASONE SODIUM PHOSPHATE 10 MG/ML IJ SOLN
INTRAMUSCULAR | Status: AC
  Filled 2022-04-14: qty 2

## 2022-04-14 MED ORDER — PROPOFOL 1000 MG/100ML IV EMUL
INTRAVENOUS | Status: AC
  Filled 2022-04-14: qty 100

## 2022-04-14 MED ORDER — BACITRACIN ZINC 500 UNIT/GM EX OINT
TOPICAL_OINTMENT | CUTANEOUS | Status: AC
  Filled 2022-04-14: qty 28.35

## 2022-04-14 MED ORDER — OXYCODONE HCL 5 MG/5ML PO SOLN: 5.0000 mg | Freq: Once | ORAL | Status: DC | PRN

## 2022-04-14 MED ORDER — ORAL CARE MOUTH RINSE: 15.0000 mL | Freq: Once | OROMUCOSAL | Status: AC

## 2022-04-14 MED ORDER — SCOPOLAMINE 1 MG/3DAYS TD PT72
1.0000 | MEDICATED_PATCH | TRANSDERMAL | Status: DC
  Administered 2022-04-14: 1.5 mg via TRANSDERMAL
  Filled 2022-04-14: qty 1

## 2022-04-14 MED ORDER — MIDAZOLAM HCL 2 MG/2ML IJ SOLN: 0.5000 mg | Freq: Once | INTRAMUSCULAR | Status: DC | PRN

## 2022-04-14 MED ORDER — ONDANSETRON HCL 4 MG/2ML IJ SOLN
INTRAMUSCULAR | Status: DC | PRN
  Administered 2022-04-14: 4 mg via INTRAVENOUS

## 2022-04-14 MED ORDER — LACTATED RINGERS IV SOLN: INTRAVENOUS | Status: DC

## 2022-04-14 MED ORDER — MIDAZOLAM HCL 2 MG/2ML IJ SOLN
INTRAMUSCULAR | Status: AC
  Filled 2022-04-14: qty 2

## 2022-04-14 SURGICAL SUPPLY — 41 items
BAG COUNTER SPONGE SURGICOUNT (BAG) IMPLANT
BLADE HEX COATED 2.75 (ELECTRODE) ×1 IMPLANT
BLADE SURG 15 STRL LF DISP TIS (BLADE) ×3 IMPLANT
BLADE SURG 15 STRL SS (BLADE) ×3
CHLORAPREP W/TINT 26 (MISCELLANEOUS) IMPLANT
COVER SURGICAL LIGHT HANDLE (MISCELLANEOUS) ×1 IMPLANT
DERMABOND ADVANCED .7 DNX12 (GAUZE/BANDAGES/DRESSINGS) IMPLANT
DRAPE LAPAROSCOPIC ABDOMINAL (DRAPES) ×1 IMPLANT
DRSG TEGADERM 4X4.75 (GAUZE/BANDAGES/DRESSINGS) IMPLANT
ELECT REM PT RETURN 15FT ADLT (MISCELLANEOUS) ×1 IMPLANT
GAUZE 4X4 16PLY ~~LOC~~+RFID DBL (SPONGE) ×1 IMPLANT
GAUZE SPONGE 2X2 8PLY STRL LF (GAUZE/BANDAGES/DRESSINGS) IMPLANT
GAUZE SPONGE 4X4 12PLY STRL (GAUZE/BANDAGES/DRESSINGS) IMPLANT
GLOVE BIO SURGEON STRL SZ7 (GLOVE) ×1 IMPLANT
GLOVE BIOGEL PI IND STRL 7.0 (GLOVE) ×1 IMPLANT
GLOVE BIOGEL PI IND STRL 7.5 (GLOVE) ×1 IMPLANT
GOWN STRL REUS W/ TWL LRG LVL3 (GOWN DISPOSABLE) ×2 IMPLANT
GOWN STRL REUS W/ TWL XL LVL3 (GOWN DISPOSABLE) ×1 IMPLANT
GOWN STRL REUS W/TWL LRG LVL3 (GOWN DISPOSABLE) ×2
GOWN STRL REUS W/TWL XL LVL3 (GOWN DISPOSABLE) ×1
KIT BASIN OR (CUSTOM PROCEDURE TRAY) ×1 IMPLANT
KIT TURNOVER KIT A (KITS) IMPLANT
MARKER SKIN DUAL TIP RULER LAB (MISCELLANEOUS) ×1 IMPLANT
NDL HYPO 25X1 1.5 SAFETY (NEEDLE) ×1 IMPLANT
NEEDLE HYPO 22GX1.5 SAFETY (NEEDLE) IMPLANT
NEEDLE HYPO 25X1 1.5 SAFETY (NEEDLE) ×1 IMPLANT
PACK BASIC VI WITH GOWN DISP (CUSTOM PROCEDURE TRAY) ×1 IMPLANT
PENCIL SMOKE EVACUATOR (MISCELLANEOUS) IMPLANT
SPIKE FLUID TRANSFER (MISCELLANEOUS) ×1 IMPLANT
STRIP CLOSURE SKIN 1/2X4 (GAUZE/BANDAGES/DRESSINGS) ×1 IMPLANT
SUT ETHILON 3 0 PS 1 (SUTURE) IMPLANT
SUT MNCRL AB 4-0 PS2 18 (SUTURE) ×1 IMPLANT
SUT SILK 2 0 SH (SUTURE) IMPLANT
SUT VIC AB 2-0 SH 27 (SUTURE) ×1
SUT VIC AB 2-0 SH 27X BRD (SUTURE) ×1 IMPLANT
SUT VIC AB 3-0 SH 27 (SUTURE) ×2
SUT VIC AB 3-0 SH 27XBRD (SUTURE) ×1 IMPLANT
SYR BULB IRRIG 60ML STRL (SYRINGE) IMPLANT
SYR CONTROL 10ML LL (SYRINGE) ×1 IMPLANT
TOWEL OR 17X26 10 PK STRL BLUE (TOWEL DISPOSABLE) ×1 IMPLANT
TOWEL OR NON WOVEN STRL DISP B (DISPOSABLE) ×1 IMPLANT

## 2022-04-14 NOTE — Transfer of Care (Signed)
Immediate Anesthesia Transfer of Care Note  Patient: Samantha Pena  Procedure(s) Performed: Procedure(s): LEFT BREAST SKIN EXCISION (Left)  Patient Location: PACU  Anesthesia Type:General  Level of Consciousness:  sedated, patient cooperative and responds to stimulation  Airway & Oxygen Therapy:Patient Spontanous Breathing and Patient connected to face mask oxgen  Post-op Assessment:  Report given to PACU RN and Post -op Vital signs reviewed and stable  Post vital signs:  Reviewed and stable  Last Vitals:  Vitals:   04/14/22 0541 04/14/22 0813  BP: 124/76 116/79  Pulse: 64 73  Resp: 15 14  Temp: 36.7 C   SpO2: 887% 195%    Complications: No apparent anesthesia complications

## 2022-04-14 NOTE — Discharge Instructions (Signed)
[facility] Office Phone Number [phone]  POST OP INSTRUCTIONS Take 400 mg of ibuprofen every 8 hours or 650 mg tylenol every 6 hours for next 72 hours then as needed. Use ice several times daily also.  A prescription for pain medication may be given to you upon discharge.  Take your pain medication as prescribed, if needed.  If narcotic pain medicine is not needed, then you may take acetaminophen (Tylenol), naprosyn (Alleve) or ibuprofen (Advil) as needed. Take your usually prescribed medications unless otherwise directed If you need a refill on your pain medication, please contact your pharmacy.  They will contact our office to request authorization.  Prescriptions will not be filled after 5pm or on week-ends. You should eat very light the first 24 hours after surgery, such as soup, crackers, pudding, etc.  Resume your normal diet the day after surgery. Most patients will experience some swelling and bruising in the breast.  Ice packs and a good support bra will help.  Wear the breast binder provided or a sports bra for 72 hours day and night.  After that wear a sports bra during the day until you return to the office. Swelling and bruising can take several days to resolve.  It is common to experience some constipation if taking pain medication after surgery.  Increasing fluid intake and taking a stool softener will usually help or prevent this problem from occurring.  A mild laxative (Milk of Magnesia or Miralax) should be taken according to package directions if there are no bowel movements after 48 hours. I used skin glue on the incision, you may shower in 24 hours.  The glue will flake off over the next 2-3 weeks.  Any sutures or staples will be removed at the office during your follow-up visit. ACTIVITIES:  You may resume regular daily activities (gradually increasing) beginning the next day.  Wearing a good support bra or sports bra minimizes pain and swelling.  You may have  sexual intercourse when it is comfortable. You may drive when you no longer are taking prescription pain medication, you can comfortably wear a seatbelt, and you can safely maneuver your car and apply brakes. RETURN TO WORK:  ______________________________________________________________________________________ You should see your doctor in the office for a follow-up appointment approximately two weeks after your surgery.  Your doctor's nurse will typically make your follow-up appointment when she calls you with your pathology report.  Expect your pathology report 3-4 business days after your surgery.  You may call to check if you do not hear from us after three days. OTHER INSTRUCTIONS: _______________________________________________________________________________________________ _____________________________________________________________________________________________________________________________________ _____________________________________________________________________________________________________________________________________ _____________________________________________________________________________________________________________________________________  WHEN TO CALL DR Lalanya Rufener: Fever over 101.0 Nausea and/or vomiting. Extreme swelling or bruising. Continued bleeding from incision. Increased pain, redness, or drainage from the incision.  The clinic staff is available to answer your questions during regular business hours.  Please don't hesitate to call and ask to speak to one of the nurses for clinical concerns.  If you have a medical emergency, go to the nearest emergency room or call 911.  A surgeon from [facility] is always on call at the hospital.  For further questions, please visit [email or web address]  

## 2022-04-14 NOTE — Anesthesia Postprocedure Evaluation (Signed)
Anesthesia Post Note  Patient: Samantha Pena  Procedure(s) Performed: LEFT BREAST SKIN EXCISION (Left: Breast)     Patient location during evaluation: PACU Anesthesia Type: General Level of consciousness: awake and alert, patient cooperative and oriented Pain management: pain level controlled Vital Signs Assessment: post-procedure vital signs reviewed and stable Respiratory status: spontaneous breathing, nonlabored ventilation, respiratory function stable and patient connected to nasal cannula oxygen Cardiovascular status: blood pressure returned to baseline and stable Postop Assessment: no apparent nausea or vomiting and able to ambulate Anesthetic complications: no   No notable events documented.  Last Vitals:  Vitals:   04/14/22 0845 04/14/22 0850  BP: 108/73 115/78  Pulse: 64 73  Resp: 19 16  Temp: (!) 36.3 C (!) 36.3 C  SpO2: 98% 100%    Last Pain:  Vitals:   04/14/22 0850  TempSrc:   PainSc: 0-No pain                 Hallelujah Wysong,E. Bertil Brickey

## 2022-04-14 NOTE — Interval H&P Note (Signed)
History and Physical Interval Note:  04/14/2022 6:58 AM  Samantha Pena  has presented today for surgery, with the diagnosis of LEFT BREAST CANCER.  The various methods of treatment have been discussed with the patient and family. After consideration of risks, benefits and other options for treatment, the patient has consented to  Procedure(s): LEFT BREAST SKIN EXCISION (Left) as a surgical intervention.  The patient's history has been reviewed, patient examined, no change in status, stable for surgery.  I have reviewed the patient's chart and labs.  Questions were answered to the patient's satisfaction.     Rolm Bookbinder

## 2022-04-14 NOTE — Op Note (Signed)
Preoperative diagnosis: positive left nipple biopsy after nsm and nipple excision Postoperative diagnosis: saa Procedure: excision of left areola Surgeon: Dr Serita Grammes Anesthesia general Specimens left areola marked short superior, long lateral Complications none Drains none Sponge and needle count correct Dispo recovery stable.   Indications: This is a 9 yof s/p left nsm with a positive left nipple biopsy which continued to be positive. We discussed returning for left areola excision   Procedure: After informed consent obtained she was taken to the OR.  Antibiotics were given. SCDs were in place.  She was prepped and draped in a standard sterile surgical fashion.  A surgical timeout  was performed.   I made an elliptical incision around the areola.  I removed this and passed it off the table as a specimen. I marked this with a short superior and long lateral.   I then closed some of the deep tissue with 3-0 Vicryl suture.  I did not violate the expander.  I then closed the skin with 3-0 nylon sutures. I placed bacitracin and dressing. She tolerated this well and was transferred to pacu stable.

## 2022-04-14 NOTE — Anesthesia Procedure Notes (Signed)
Procedure Name: LMA Insertion Date/Time: 04/14/2022 7:43 AM  Performed by: Lavina Hamman, CRNAPre-anesthesia Checklist: Patient identified, Emergency Drugs available, Suction available, Patient being monitored and Timeout performed Patient Re-evaluated:Patient Re-evaluated prior to induction Oxygen Delivery Method: Circle system utilized Preoxygenation: Pre-oxygenation with 100% oxygen Induction Type: IV induction Ventilation: Mask ventilation without difficulty LMA: LMA inserted LMA Size: 4.0 Number of attempts: 1 Placement Confirmation: positive ETCO2 and breath sounds checked- equal and bilateral Tube secured with: Tape Dental Injury: Teeth and Oropharynx as per pre-operative assessment

## 2022-04-15 ENCOUNTER — Encounter (HOSPITAL_COMMUNITY): Payer: Self-pay | Admitting: General Surgery

## 2022-04-17 ENCOUNTER — Inpatient Hospital Stay: Payer: 59 | Admitting: Hematology and Oncology

## 2022-04-18 ENCOUNTER — Inpatient Hospital Stay: Payer: 59 | Attending: Hematology and Oncology | Admitting: Hematology and Oncology

## 2022-04-18 ENCOUNTER — Telehealth: Payer: Self-pay | Admitting: *Deleted

## 2022-04-18 ENCOUNTER — Other Ambulatory Visit: Payer: Self-pay

## 2022-04-18 ENCOUNTER — Encounter: Payer: Self-pay | Admitting: *Deleted

## 2022-04-18 VITALS — BP 137/87 | HR 95 | Temp 97.5°F | Resp 18 | Ht 66.0 in | Wt 155.4 lb

## 2022-04-18 DIAGNOSIS — Z79810 Long term (current) use of selective estrogen receptor modulators (SERMs): Secondary | ICD-10-CM | POA: Insufficient documentation

## 2022-04-18 DIAGNOSIS — Z9012 Acquired absence of left breast and nipple: Secondary | ICD-10-CM | POA: Insufficient documentation

## 2022-04-18 DIAGNOSIS — Z79899 Other long term (current) drug therapy: Secondary | ICD-10-CM | POA: Diagnosis not present

## 2022-04-18 DIAGNOSIS — Z923 Personal history of irradiation: Secondary | ICD-10-CM | POA: Insufficient documentation

## 2022-04-18 DIAGNOSIS — Z170 Estrogen receptor positive status [ER+]: Secondary | ICD-10-CM | POA: Insufficient documentation

## 2022-04-18 DIAGNOSIS — C50212 Malignant neoplasm of upper-inner quadrant of left female breast: Secondary | ICD-10-CM | POA: Diagnosis present

## 2022-04-18 NOTE — Progress Notes (Signed)
Patient Care Team: Marin Olp, MD as PCP - General (Family Medicine) Tiajuana Amass, MD as Referring Physician (Allergy and Immunology) Mauro Kaufmann, RN as Oncology Nurse Navigator Rockwell Germany, RN as Oncology Nurse Navigator Nicholas Lose, MD as Consulting Physician (Hematology and Oncology)  DIAGNOSIS:  Encounter Diagnosis  Name Primary?   Malignant 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, estrogen receptor positive ([place]) Yes    SUMMARY OF ONCOLOGIC HISTORY: Oncology History  Malignant 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, estrogen receptor positive ([place])  12/16/2021 Initial Diagnosis   Screening mammogram detected left breast distortion and calcifications in the central upper quadrant.  Ultrasound at 11 o'clock position there was a 2 cm mass.  Axilla negative.  Ultrasound-guided biopsy revealed grade 2 invasive ductal carcinoma ER 95%, PR 100%, Ki-67 10%, HER2 negative   12/28/2021 Cancer Staging   Staging form: Breast, AJCC 8th Edition - Clinical: Stage IA (cT1c, cN0, cM0, G2, ER+, PR+, HER2-) - Signed by Nicholas Lose, MD on 12/28/2021 Stage prefix: Initial diagnosis Histologic grading system: 3 grade system   02/13/2022 Cancer Staging   Staging form: Breast, AJCC 8th Edition - Pathologic: Stage IB (pT3, pN1, cM0, G2, ER+, PR+, HER2-) - Signed by Nicholas Lose, MD on 02/13/2022 Histologic grading system: 3 grade system   04/14/2022 Surgery   Left breast areolar excision: Invasive moderately differentiated adenocarcinoma grade 2 measures 2.4 cm, tumor present in the deep margin     CHIEF COMPLIANT:  Follow-up left breast cancer   INTERVAL HISTORY: Samantha Pena is a 56 y.o with the above-mentioned left breast cancer. She presents to the clinic for a follow-up.  She had a repeat lumpectomy after the initial lumpectomy had positive margins.  The final pathology revealed an additional area of invasive breast cancer but the  deep margin was positive.   ALLERGIES:  has No Known Allergies.  MEDICATIONS:  Current Outpatient Medications  Medication Sig Dispense Refill   ALPRAZolam (XANAX) 0.25 MG tablet Take 0.25 mg by mouth every 8 (eight) hours as needed for anxiety.     Ascorbic Acid (VITAMIN C) 1000 MG tablet Take 1,000 mg by mouth daily as needed (immune support).     Calcium Carb-Cholecalciferol (CALTRATE 600+D3 SOFT) 600-20 MG-MCG CHEW Chew 1 tablet by mouth in the morning.     fluticasone (FLONASE) 50 MCG/ACT nasal spray Place 2 sprays into both nostrils in the morning.     ibuprofen (ADVIL) 200 MG tablet Take 400 mg by mouth every 6 (six) hours as needed for moderate pain.     Multiple Vitamin (MULTIVITAMIN WITH MINERALS) TABS tablet Take 1 tablet by mouth in the morning. Women's Multivitamin     SUMAtriptan (IMITREX) 20 MG/ACT nasal spray USE 1 SPRAY INTO EACH NOSTRIL EVERY 2 HOURS AS NEEDED (max twice per day) 6 each 11   tamoxifen (NOLVADEX) 10 MG tablet TAKE 1 TABLET BY MOUTH EVERY DAY 90 tablet 1   tretinoin (RETIN-A) 0.025 % cream Apply 1 application  topically daily as needed (acne/skin blemishes).     zinc gluconate 50 MG tablet Take 50 mg by mouth daily.     zolpidem (AMBIEN) 10 MG tablet Take 10 mg by mouth at bedtime as needed for sleep.     No current facility-administered medications for this visit.    PHYSICAL EXAMINATION: ECOG PERFORMANCE STATUS: 1 - Symptomatic but completely ambulatory  Vitals:   04/18/22 1433  BP: 137/87  Pulse: 95  Resp:  18  Temp: (!) 97.5 F (36.4 C)  SpO2: 100%   Filed Weights   04/18/22 1433  Weight: 155 lb 6.4 oz (70.5 kg)      LABORATORY DATA:  I have reviewed the data as listed    Latest Ref Rng & Units 03/31/2022    2:00 PM 01/31/2022    7:29 AM 06/24/2021   11:52 AM  CMP  Glucose 70 - 99 mg/dL 91  112  120   BUN 6 - 20 mg/dL _0 Creatinine 0.44 - 1.00 mg/dL 0.92  0.92  0.95   Sodium 135 - 145 mmol/L 140  138  139   Potassium 3.5 -  5.1 mmol/L 4.2  3.8  4.1   Chloride 98 - 111 mmol/L 108  108  103   CO2 22 - 32 mmol/L _1 Calcium 8.9 - 10.3 mg/dL 9.5  9.0  9.3   Total Protein 6.0 - 8.3 g/dL   6.8   Total Bilirubin 0.2 - 1.2 mg/dL   0.4   Alkaline Phos 39 - 117 U/L   34   AST 0 - 37 U/L   15   ALT 0 - 35 U/L   8     Lab Results  Component Value Date   WBC 5.9 03/31/2022   HGB 12.8 03/31/2022   HCT 39.5 03/31/2022   MCV 85.9 03/31/2022   PLT 250 03/31/2022   NEUTROABS 3.1 06/24/2021    ASSESSMENT & PLAN:  Malignant 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, estrogen receptor positive ([place]) 12/16/2021:Screening mammogram detected left breast distortion and calcifications in the central upper quadrant.  Ultrasound at 11 o'clock position there was a 2 cm mass.  Axilla negative.  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  Left mastectomy 01/31/2022: Grade 2 IDC 6 cm, 1/1 lymph node positive, ER 95%, PR 100%, HER2 negative, Ki-67 10%, nipple biopsy: Positive 2. Oncotype DX 03/01/2022: Score: 15 (distant recurrence at 9 years: 14%) 3.  Additional surgery to remove nipple  4. Adjuvant radiation therapy followed by 5. Adjuvant antiestrogen therapy with tamoxifen (starting at 10 mg daily on 12/29/2021) ----------------------------------------------------------------------------------------------------------------------------- 04/14/2022: Left breast areolar excision: Invasive moderately differentiated carcinoma grade 2 measuring 2.4 cm, deep margin is positive. I discussed with Dr. Donne Hazel and it appears that the deep margin is the surface of the implant and therefore there is no further surgery indicated.   Return to clinic after radiation is completed   No orders of the defined types were placed in this encounter.  The patient has a good understanding of the overall plan. she agrees with it. she will  call with any problems that may develop before the next visit here. Total time spent: 30 mins including face to face time and time spent for planning, charting and co-ordination of care   Harriette Ohara, MD 04/18/22    I Gardiner Coins am acting as a Education administrator for [employer]  I have reviewed the above documentation for accuracy and completeness, and I agree with the above.

## 2022-04-18 NOTE — Assessment & Plan Note (Signed)
12/16/2021:Screening mammogram detected left breast distortion and calcifications in the central upper quadrant.  Ultrasound at 11 o'clock position there was a 2 cm mass.  Axilla negative.  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  Left mastectomy 01/31/2022: Grade 2 IDC 6 cm, 1/1 lymph node positive, ER 95%, PR 100%, HER2 negative, Ki-67 10%, nipple biopsy: Positive 2. Oncotype DX 03/01/2022: Score: 15 (distant recurrence at 9 years: 14%) 3.  Additional surgery to remove nipple  4. Adjuvant radiation therapy followed by 5. Adjuvant antiestrogen therapy with tamoxifen (starting at 10 mg daily on 12/29/2021) ----------------------------------------------------------------------------------------------------------------------------- 04/14/2022: Left breast areolar excision: Invasive moderately differentiated carcinoma grade 2 measuring 2.4 cm, deep margin is positive.  I discussed with the patient the positive deep margin is very concerning for residual disease.  We will discuss her case in tumor board and make a final decision.

## 2022-04-18 NOTE — Telephone Encounter (Signed)
Requested prognostics to be run from pathology on recent sx from 12/15.

## 2022-04-20 ENCOUNTER — Encounter: Payer: Self-pay | Admitting: *Deleted

## 2022-04-25 NOTE — Progress Notes (Signed)
Location of Breast Cancer: Malignant 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, estrogen receptor positive ([place])   Histology per Pathology Report:  FINAL MICROSCOPIC DIAGNOSIS:  A. LEFT BREAST, AREOLAR EXCISION: Invasive moderately differentiated ductal adenocarcinoma, grade 2 (3+2+1) Tumor measures 2.4 cm in greatest dimension (estimated) (pT2) Tumor present in deep margin Changes consistent with prior biopsy  ONCOLOGY TABLE:  INVASIVE CARCINOMA OF THE BREAST:  Resection  Procedure: Lumpectomy/areolar excision Specimen Laterality: Left Histologic Type: Ductal carcinoma, NOS Histologic Grade:      Glandular (Acinar)/Tubular Differentiation: 3      Nuclear Pleomorphism: 2      Mitotic Rate: 1      Overall Grade: Grade 2 (6 of 9) Tumor Size: Estimated 2.4 cm (tumor present within six 0.4 cm sections) Ductal Carcinoma In Situ: Not identified Tumor Extent: Limited to breast parenchyma Treatment Effect in the Breast: No known presurgical therapy Margins: Tumor present within deep margin DCIS Margins: Not applicable Regional Lymph Nodes: Not applicable (no lymph nodes submitted or found) Distant Metastasis: Not applicable Breast Biomarker Testing Performed on Previous Biopsy:      Testing Performed on Case Number: [id number]            Estrogen Receptor: Positive (95%, moderate to strong staining)            Progesterone Receptor: Positive (100%, strong staining)            HER2: Group 5 negative by FISH            Ki-67: 10% Pathologic Stage Classification (pTNM, AJCC 8th Edition): pT2, pN n/a Representative Tumor Block: A2, A4 Comment(s): None  ([id number])  GROSS DESCRIPTION:  The specimen is received fresh, and consists of a 3.5 x 2.6 cm portion of tan-pink skin, excised up to a depth of 0.7 cm.  Per the requisition sutures are present designating the superior and lateral aspects, and the central portion of the specimen displays a 0.5 cm defect.  The  skin surface is slightly granular surrounding the defect.  The specimen is inked as follows: Medial-yellow, superior-red, lateral-orange, inferior-blue, deep-black.  The specimen is serially sectioned to reveal a tan-white, dense cut surface.  The specimen is submitted in 5 cassettes. 1 = lateral margin, perpendicular 2-4 = central aspect of specimen with superior, deep, and inferior margins 5 = medial margin, perpendicular Craig Staggers 04/15/2022)    Final Diagnosis performed by Tobin Chad, MD.   Electronically signed 04/17/2022   Receptor Status:  Estrogen Receptor: Positive (95%, moderate to strong  staining)            Progesterone Receptor: Positive (100%, strong staining)             HER2: Group 5 negative by FISH             Ki-67: 10%   Did patient present with symptoms (if so, please note symptoms) or was this found on screening mammography?:   Past/Anticipated interventions by surgeon, if any:  Preoperative diagnosis: positive left nipple biopsy after nsm and nipple excision Postoperative diagnosis: saa Procedure: excision of left areola Surgeon: Dr Serita Grammes  04-14-22  Past/Anticipated interventions by medical oncology, if any:  ASSESSMENT & PLAN:  Malignant 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, estrogen receptor positive ([place]) 12/16/2021:Screening mammogram detected left breast distortion and calcifications in the central upper quadrant.  Ultrasound at 11 o'clock position there was a 2 cm mass.  Axilla negative.  Ultrasound-guided biopsy revealed grade 2 invasive  ductal carcinoma ER 95%, PR 100%, Ki-67 10%, HER2 negative   Treatment plan: 1.  Left mastectomy 01/31/2022: Grade 2 IDC 6 cm, 1/1 lymph node positive, ER 95%, PR 100%, HER2 negative, Ki-67 10%, nipple biopsy: Positive 2. Oncotype DX 03/01/2022: Score: 15 (distant recurrence at 9 years: 14%) 3.  Additional surgery to remove nipple  4. Adjuvant radiation therapy followed by 5.  Adjuvant antiestrogen therapy with tamoxifen (starting at 10 mg daily on 12/29/2021) ----------------------------------------------------------------------------------------------------------------------------- 04/14/2022: Left breast areolar excision: Invasive moderately differentiated carcinoma grade 2 measuring 2.4 cm, deep margin is positive. I discussed with Dr. Donne Hazel and it appears that the deep margin is the surface of the implant and therefore there is no further surgery indicated.   Return to clinic after radiation is completed     No orders of the defined types were placed in this encounter.   The patient has a good understanding of the overall plan. she agrees with it. she will call with any problems that may develop before the next visit here. Total time spent: 30 mins including face to face time and time spent for planning, charting and co-ordination of care    Harriette Ohara, MD 04/18/22  Lymphedema issues, if any:  none  Pain issues, if any:  none  Still has stitches in from surgery, has tissue expander  SAFETY ISSUES: Prior radiation? none Pacemaker/ICD? none Possible current pregnancy? none Is the patient on methotrexate? none  Current Complaints / other details:  no major concerns  Vitals:   05/05/22 1247  BP: 133/89  Pulse: 70  Resp: 18  Temp: (!) 97.2 F (36.2 C)  SpO2: 100%

## 2022-04-26 ENCOUNTER — Other Ambulatory Visit: Payer: Self-pay | Admitting: Family Medicine

## 2022-04-27 ENCOUNTER — Encounter: Payer: Self-pay | Admitting: *Deleted

## 2022-04-29 ENCOUNTER — Other Ambulatory Visit: Payer: Self-pay | Admitting: Family Medicine

## 2022-05-04 NOTE — Progress Notes (Signed)
Radiation Oncology         [phone] ________________________________  Name: Samantha Pena MRN: [id number]  Date: 05/05/2022  DOB: 11-Mar-1966  Follow-Up Visit Note  Outpatient  CC: Marin Olp, MD  Nicholas Lose, MD  Diagnosis:      ICD-10-CM   1. Malignant 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, estrogen receptor positive ([place])  C50.212    Z17.0       Cancer Staging  Malignant 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, estrogen receptor positive ([place]) Staging form: Breast, AJCC 8th Edition - Clinical: Stage IA (cT1c, cN0, cM0, G2, ER+, PR+, HER2-) - Signed by Nicholas Lose, MD on 12/28/2021 Stage prefix: Initial diagnosis Histologic grading system: 3 grade system - Pathologic: Stage IB (pT3, pN1, cM0, G2, ER+, PR+, HER2-) - Signed by Nicholas Lose, MD on 02/13/2022 Histologic grading system: 3 grade system   Stage IB (pT3, pN1, cM0) Left Breast UIQ, Invasive Ductal Carcinoma, ER+ / PR+ / Her2-, Grade 2 : s/p left mastectomy (w/ breast reconstruction), nodal biopsies with 1 positive node, and areola/nipple excision  CHIEF COMPLAINT: Here to discuss management of left breast cancer  Narrative:  The patient returns today for follow-up.     Since consultation date of 01/09/22, she opted to proceed with a left nipple sparring mastectomy with SLN biopsies and reconstructive surgery on 01/31/22 under the care of Dr. Donne Hazel. Pathology from the procedure revealed: tumor size of 6 cm; histology of grade 2 invasive ductal carcinoma; all margins negative for invasive disease; margin status to invasive disease of 3 mm from the anterior margin; nodal status of 1/1 left axillary SLN excision positive for carcinoma;  ER status: 95% positive; PR status 100% positive; proliferation marker Ki67 at 10%; Her2 status negative; Grade 2.   Biopsy of the left nipple also collected on 01/09/22 was positive for carcinoma.   Oncotype DX was obtained on the  final surgical sample and the recurrence score of 15 predicts a risk of recurrence outside the breast over the next 9 years of 14%, if the patient's only systemic therapy is an antiestrogen for 5 years.  It also predicts no significant benefit from chemotherapy.  The patient subsequently underwent excision of the left nipple on 04/06/22. Pathology from the procedure revealed grade 2 invasive ductal carcinoma involving the dermis; positive for PNI; peripheral margin positive for invasive carcinoma involvement measuring 5 mm.  She soon after underwent excision of the left areola on 04/14/22. Pathology showed grade 2 invasive ductal carcinoma measuring 2.4 cm in the greatest extent, with tumor present in the deep margin (the deep margin was apparently the surface of the implant and she will not require any additional surgeries). Prognostic indicators significant for: estrogen receptor 90% positive with moderate staining intensity; progesterone receptor 100% positive with strong staining intensity; Proliferation marker Ki67 at 5%; Her2 status negative; Grade 2.    The patient has met with Dr. Lindi Adie and has agreed to proceed with tamoxifen following XRT.  Symptomatically, the patient reports:  Lymphedema issues, if any:  none  Pain issues, if any:  none  Still has stitches in from surgery, has tissue expander  SAFETY ISSUES: Prior radiation? none Pacemaker/ICD? none Possible current pregnancy? none Is the patient on methotrexate? 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        ALLERGIES:  has No Known Allergies.  Meds: Current Outpatient Medications  Medication Sig Dispense Refill   ALPRAZolam (XANAX) 0.25 MG tablet Take 0.25 mg by mouth every 8 (eight) hours as needed for anxiety.     Ascorbic Acid (VITAMIN C) 1000 MG tablet Take 1,000 mg by mouth daily as needed (immune support).     Calcium  Carb-Cholecalciferol (CALTRATE 600+D3 SOFT) 600-20 MG-MCG CHEW Chew 1 tablet by mouth in the morning.     fluticasone (FLONASE) 50 MCG/ACT nasal spray Place 2 sprays into both nostrils in the morning.     ibuprofen (ADVIL) 200 MG tablet Take 400 mg by mouth every 6 (six) hours as needed for moderate pain.     Multiple Vitamin (MULTIVITAMIN WITH MINERALS) TABS tablet Take 1 tablet by mouth in the morning. Women's Multivitamin     SUMAtriptan (IMITREX) 20 MG/ACT nasal spray USE 1 SPRAY INTO EACH NOSTRIL EVERY 2 HOURS AS NEEDED (MAX TWICE PER DAY) 6 each 11   tamoxifen (NOLVADEX) 10 MG tablet TAKE 1 TABLET BY MOUTH EVERY DAY 90 tablet 1   tretinoin (RETIN-A) 0.025 % cream Apply 1 application  topically daily as needed (acne/skin blemishes).     zinc gluconate 50 MG tablet Take 50 mg by mouth daily.     zolpidem (AMBIEN) 10 MG tablet Take 10 mg by mouth at bedtime as needed for sleep.     No current facility-administered medications for this encounter.    Physical Findings:  vitals were not taken for this visit. .     General: Alert and oriented, in no acute distress HEENT: Head is normocephalic. Extraocular movements are intact.  Heart: Regular in rate and rhythm with no murmurs, rubs, or gallops. Chest: Clear to auscultation bilaterally, with no rhonchi, wheezes, or rales. Abdomen: Soft, nontender, nondistended, with no rigidity or guarding. Extremities: No cyanosis or edema. Lymphatics: see Neck Exam Musculoskeletal: symmetric strength and muscle tone throughout. Neurologic: No obvious focalities. Speech is fluent.  Psychiatric: Judgment and insight are intact. Affect is appropriate. Breast exam reveals stitches over central left reconstructed breast with expander in place. No sign of recurrence. Healing well.  Lab Findings: Lab Results  Component Value Date   WBC 5.9 03/31/2022   HGB 12.8 03/31/2022   HCT 39.5 03/31/2022   MCV 85.9 03/31/2022   PLT 250 03/31/2022       Radiographic Findings: No results found.  Impression/Plan: Left breast cancer, locally advaced  We discussed adjuvant radiotherapy today.  I recommend six and a half weeks of radiation therapy in order to minimize risk of locoregional recurrence.  I reviewed the logistics, benefits, risks, and potential side effects of this treatment in detail. Risks may include but not necessary be limited to acute and late injury tissue in the radiation fields such as skin irritation (change in color/pigmentation, itching, dryness, pain, peeling). She may experience fatigue. We also discussed possible risk of long term cosmetic changes or scar tissue. There is also a smaller risk for lung toxicity, cardiac toxicity, brachial plexopathy, lymphedema, musculoskeletal changes, rib fragility or induction of a second malignancy, late chronic non-healing soft tissue wound, capsular contracture given reconstruction after mastectomy.  The patient asked good questions which I answered to her satisfaction. She is enthusiastic about proceeding with treatment. A consent form has been  signed and placed in her chart.  We simmed her today and plan to start tx on 1/24 to allow a bit more healing after suture removal on 1/19.  Dr Iran Planas and Dr Donne Hazel personally notified of plan  above.  Urine preg test negative today.  On date of service, in total, I spent 45 minutes on this encounter. Patient was seen in person.  _____________________________________   Eppie Gibson, MD  This document serves as a record of services personally performed by Eppie Gibson, MD. It was created on her behalf by Roney Mans, a trained medical scribe. The creation of this record is based on the scribe's personal observations and the provider's statements to them. This document has been checked and approved by the attending provider.

## 2022-05-05 ENCOUNTER — Ambulatory Visit
Admission: RE | Admit: 2022-05-05 | Discharge: 2022-05-05 | Disposition: A | Discharge status: Home / Self Care | Payer: 59 | Source: Ambulatory Visit | Attending: Radiation Oncology | Admitting: Radiation Oncology

## 2022-05-05 ENCOUNTER — Encounter: Payer: Self-pay | Admitting: Radiation Oncology

## 2022-05-05 ENCOUNTER — Other Ambulatory Visit: Payer: Self-pay

## 2022-05-05 ENCOUNTER — Ambulatory Visit: Payer: 59

## 2022-05-05 VITALS — BP 133/89 | HR 70 | Temp 97.2°F | Resp 18 | Ht 66.0 in | Wt 153.1 lb

## 2022-05-05 DIAGNOSIS — C50212 Malignant neoplasm of upper-inner quadrant of left female breast: Secondary | ICD-10-CM

## 2022-05-05 DIAGNOSIS — Z170 Estrogen receptor positive status [ER+]: Secondary | ICD-10-CM

## 2022-05-05 DIAGNOSIS — Z510 Encounter for antineoplastic radiation therapy: Secondary | ICD-10-CM | POA: Insufficient documentation

## 2022-05-05 DIAGNOSIS — Z79899 Other long term (current) drug therapy: Secondary | ICD-10-CM | POA: Insufficient documentation

## 2022-05-05 LAB — PREGNANCY, URINE: Preg Test, Ur: NEGATIVE

## 2022-05-08 ENCOUNTER — Ambulatory Visit: Payer: 59 | Attending: General Surgery

## 2022-05-08 VITALS — Wt 152.2 lb

## 2022-05-08 DIAGNOSIS — Z483 Aftercare following surgery for neoplasm: Secondary | ICD-10-CM

## 2022-05-08 NOTE — Therapy (Signed)
OUTPATIENT PHYSICAL THERAPY SOZO SCREENING NOTE   Patient Name: Samantha Pena MRN: [id number] DOB:1966/03/02, 57 y.o., female Today's Date: 05/08/2022  PCP: Marin Olp, MD REFERRING PROVIDER: Rolm Bookbinder, MD   PT End of Session - 05/08/22 1627     Visit Number 11   # unchanged due to screen only   PT Start Time 1625    PT Stop Time 2774    PT Time Calculation (min) 6 min    Activity Tolerance Patient tolerated treatment well    Behavior During Therapy [facility] for tasks assessed/performed             Past Medical History:  Diagnosis Date   Anxiety    Cancer ([place]) 2023   breast   COVID-19    2021   Cyst 01/2011   2016 stable. near rectum that gives pt discomfort when sitting and has grown in size in the past year    History of hiatal hernia    Migraines    Past Surgical History:  Procedure Laterality Date   BREAST CYST EXCISION Left 04/06/2022   Procedure: EXCISION OF LEFT NIPPLE;  Surgeon: Rolm Bookbinder, MD;  Location: [place];  Service: General;  Laterality: Left;   BREAST LUMPECTOMY Left 04/14/2022   Procedure: LEFT BREAST SKIN EXCISION;  Surgeon: Rolm Bookbinder, MD;  Location: [facility];  Service: General;  Laterality: Left;   BREAST RECONSTRUCTION WITH PLACEMENT OF TISSUE EXPANDER AND ALLODERM Left 01/31/2022   Procedure: BREAST RECONSTRUCTION WITH PLACEMENT OF TISSUE EXPANDER AND ALLODERM;  Surgeon: Irene Limbo, MD;  Location: [place];  Service: Plastics;  Laterality: Left;   CERVICAL POLYPECTOMY  02/21/2021   uterus   CESAREAN SECTION  03/18/1999   FOOT SURGERY Right 09/30/2002   bone in toe had to be shaved and realigned    LAPAROSCOPIC APPENDECTOMY N/A 10/08/2020   Procedure: APPENDECTOMY LAPAROSCOPIC;  Surgeon: Rolm Bookbinder, MD;  Location: [place];  Service: General;  Laterality: N/A;   NIPPLE SPARING MASTECTOMY WITH SENTINEL LYMPH NODE BIOPSY Left 01/31/2022   Procedure: LEFT NIPPLE SPARING MASTECTOMY WITH AXILLARY SENTINEL LYMPH NODE  BIOPSY;  Surgeon: Rolm Bookbinder, MD;  Location: [place];  Service: General;  Laterality: Left;   Patient Active Problem List   Diagnosis Date Noted   S/P left mastectomy 01/31/2022   Malignant 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, estrogen receptor positive ([place]) 12/28/2021   S/P laparoscopic appendectomy 10/08/2020   History of Lyme disease 11/12/2018   Urge incontinence 11/12/2017   Overweight 05/07/2017   Allergic rhinitis 05/07/2017   Hyperlipidemia 10/31/2016   TMJ disease 03/17/2015   Anal benign neoplasm 02/14/2011   ACTINIC KERATOSIS, CHEEK, LEFT 11/13/2007   Migraine headache 10/30/2006    REFERRING DIAG: left breast cancer at risk for lymphedema  THERAPY DIAG: Aftercare following surgery for neoplasm  PERTINENT HISTORY: Patient was diagnosed on 12/19/2021 with left grade 2 invasive ductal carcinoma breast cancer. She underwent a left mastectomy with immediate reconstruction with expanders placed and a sentinel node biopsy (1/1 node positive) on 01/31/2022. It is ER/PR positive and HER2 negative with a Ki67 of 10%.   PRECAUTIONS: left UE Lymphedema risk, None  SUBJECTIVE: Pt returns for her first 3 month L-Dex screen.   PAIN:  Are you having pain? No  SOZO SCREENING: Patient was assessed today using the SOZO machine to determine the lymphedema index score. This was compared to her baseline score. It was determined that she is within the recommended range when compared  to her baseline and no further action is needed at this time. She will continue SOZO screenings. These are done every 3 months for 2 years post operatively followed by every 6 months for 2 years, and then annually.   L-DEX FLOWSHEETS - 05/08/22 1600       L-DEX LYMPHEDEMA SCREENING   Measurement Type Unilateral    L-DEX MEASUREMENT EXTREMITY Upper Extremity    POSITION  Standing    DOMINANT SIDE Right    At Risk Side Left    BASELINE SCORE (UNILATERAL) -0.2    L-DEX SCORE  (UNILATERAL) 0.8    VALUE CHANGE (UNILAT) 1              Otelia Limes, PTA 05/08/2022, 4:31 PM

## 2022-05-11 ENCOUNTER — Encounter: Payer: Self-pay | Admitting: *Deleted

## 2022-05-15 DIAGNOSIS — C50212 Malignant neoplasm of upper-inner quadrant of left female breast: Secondary | ICD-10-CM | POA: Diagnosis not present

## 2022-05-17 ENCOUNTER — Telehealth: Payer: Self-pay | Admitting: *Deleted

## 2022-05-17 NOTE — Telephone Encounter (Signed)
"  Samantha Pena, date of birth is 21-Nov-1965, [phone] (home) calling regarding paperwork from [place] to continue my FMLA,  Samantha Pena is looking for the certification of health care provider information to be faxed by 05/23/2022.  I am a patient of Dr. Isidore Moos."

## 2022-05-23 NOTE — Telephone Encounter (Signed)
Completed [employer] paperwork 05/22/2022 received today signed by provider.  Successfully returned to [facility] via fax [phone]).  Patient notified form ready for pick-up at office entry receptionist area before tomorrow's appointment.  Copy to XRT clerical area for EMR items to be scanned completes process.  No further instructions or actions performed by this nurse.

## 2022-05-24 ENCOUNTER — Other Ambulatory Visit: Payer: Self-pay

## 2022-05-24 ENCOUNTER — Ambulatory Visit
Admission: RE | Admit: 2022-05-24 | Discharge: 2022-05-24 | Disposition: A | Discharge status: Home / Self Care | Payer: 59 | Source: Ambulatory Visit | Attending: Radiation Oncology | Admitting: Radiation Oncology

## 2022-05-24 DIAGNOSIS — C50212 Malignant neoplasm of upper-inner quadrant of left female breast: Secondary | ICD-10-CM | POA: Diagnosis not present

## 2022-05-24 LAB — RAD ONC ARIA SESSION SUMMARY

## 2022-05-25 ENCOUNTER — Ambulatory Visit
Admission: RE | Admit: 2022-05-25 | Discharge: 2022-05-25 | Disposition: A | Discharge status: Home / Self Care | Payer: 59 | Source: Ambulatory Visit | Attending: Radiation Oncology | Admitting: Radiation Oncology

## 2022-05-25 ENCOUNTER — Other Ambulatory Visit: Payer: Self-pay

## 2022-05-25 DIAGNOSIS — C50212 Malignant neoplasm of upper-inner quadrant of left female breast: Secondary | ICD-10-CM | POA: Diagnosis not present

## 2022-05-25 LAB — RAD ONC ARIA SESSION SUMMARY
Course Elapsed Days: 1
Plan Fractions Treated to Date: 1
Plan Fractions Treated to Date: 2
Plan Prescribed Dose Per Fraction: 1.8 Gy
Plan Prescribed Dose Per Fraction: 1.8 Gy
Plan Total Fractions Prescribed: 14
Plan Total Fractions Prescribed: 28
Plan Total Prescribed Dose: 25.2 Gy
Plan Total Prescribed Dose: 50.4 Gy
Reference Point Dosage Given to Date: 3.6 Gy
Reference Point Dosage Given to Date: 3.6 Gy
Reference Point Session Dosage Given: 1.8 Gy
Reference Point Session Dosage Given: 1.8 Gy
Session Number: 2

## 2022-05-26 ENCOUNTER — Ambulatory Visit
Admission: RE | Admit: 2022-05-26 | Discharge: 2022-05-26 | Disposition: A | Discharge status: Home / Self Care | Payer: 59 | Source: Ambulatory Visit | Attending: Radiation Oncology | Admitting: Radiation Oncology

## 2022-05-26 ENCOUNTER — Other Ambulatory Visit: Payer: Self-pay

## 2022-05-26 DIAGNOSIS — C50212 Malignant neoplasm of upper-inner quadrant of left female breast: Secondary | ICD-10-CM | POA: Diagnosis not present

## 2022-05-26 LAB — RAD ONC ARIA SESSION SUMMARY
Course Elapsed Days: 2
Plan Fractions Treated to Date: 2
Plan Fractions Treated to Date: 3
Plan Prescribed Dose Per Fraction: 1.8 Gy
Plan Prescribed Dose Per Fraction: 1.8 Gy
Plan Total Fractions Prescribed: 14
Plan Total Fractions Prescribed: 28
Plan Total Prescribed Dose: 25.2 Gy
Plan Total Prescribed Dose: 50.4 Gy
Reference Point Dosage Given to Date: 5.4 Gy
Reference Point Dosage Given to Date: 5.4 Gy
Reference Point Session Dosage Given: 1.8 Gy
Reference Point Session Dosage Given: 1.8 Gy
Session Number: 3

## 2022-05-29 ENCOUNTER — Ambulatory Visit
Admission: RE | Admit: 2022-05-29 | Discharge: 2022-05-29 | Disposition: A | Discharge status: Home / Self Care | Payer: 59 | Source: Ambulatory Visit | Attending: Radiation Oncology | Admitting: Radiation Oncology

## 2022-05-29 ENCOUNTER — Other Ambulatory Visit: Payer: Self-pay

## 2022-05-29 DIAGNOSIS — C50212 Malignant neoplasm of upper-inner quadrant of left female breast: Secondary | ICD-10-CM | POA: Diagnosis not present

## 2022-05-29 LAB — RAD ONC ARIA SESSION SUMMARY
Course Elapsed Days: 5
Plan Fractions Treated to Date: 2
Plan Fractions Treated to Date: 4
Plan Prescribed Dose Per Fraction: 1.8 Gy
Plan Prescribed Dose Per Fraction: 1.8 Gy
Plan Total Fractions Prescribed: 14
Plan Total Fractions Prescribed: 28
Plan Total Prescribed Dose: 25.2 Gy
Plan Total Prescribed Dose: 50.4 Gy
Reference Point Dosage Given to Date: 7.2 Gy
Reference Point Dosage Given to Date: 7.2 Gy
Reference Point Session Dosage Given: 1.8 Gy
Reference Point Session Dosage Given: 1.8 Gy
Session Number: 4

## 2022-05-29 MED ORDER — ALRA NON-METALLIC DEODORANT (RAD-ONC)
1.0000 | Freq: Once | TOPICAL | Status: AC
  Administered 2022-05-29: 1 via TOPICAL

## 2022-05-29 MED ORDER — RADIAPLEXRX EX GEL
Freq: Once | CUTANEOUS | Status: AC
  Administered 2022-05-29: 1 via TOPICAL

## 2022-05-30 ENCOUNTER — Other Ambulatory Visit: Payer: Self-pay

## 2022-05-30 ENCOUNTER — Ambulatory Visit
Admission: RE | Admit: 2022-05-30 | Discharge: 2022-05-30 | Disposition: A | Discharge status: Home / Self Care | Payer: 59 | Source: Ambulatory Visit | Attending: Radiation Oncology | Admitting: Radiation Oncology

## 2022-05-30 DIAGNOSIS — C50212 Malignant neoplasm of upper-inner quadrant of left female breast: Secondary | ICD-10-CM | POA: Diagnosis not present

## 2022-05-30 LAB — RAD ONC ARIA SESSION SUMMARY
Course Elapsed Days: 6
Plan Fractions Treated to Date: 3
Plan Fractions Treated to Date: 5
Plan Prescribed Dose Per Fraction: 1.8 Gy
Plan Prescribed Dose Per Fraction: 1.8 Gy
Plan Total Fractions Prescribed: 14
Plan Total Fractions Prescribed: 28
Plan Total Prescribed Dose: 25.2 Gy
Plan Total Prescribed Dose: 50.4 Gy
Reference Point Dosage Given to Date: 9 Gy
Reference Point Dosage Given to Date: 9 Gy
Reference Point Session Dosage Given: 1.8 Gy
Reference Point Session Dosage Given: 1.8 Gy
Session Number: 5

## 2022-05-31 ENCOUNTER — Ambulatory Visit
Admission: RE | Admit: 2022-05-31 | Discharge: 2022-05-31 | Disposition: A | Discharge status: Home / Self Care | Payer: 59 | Source: Ambulatory Visit | Attending: Radiation Oncology | Admitting: Radiation Oncology

## 2022-05-31 ENCOUNTER — Other Ambulatory Visit: Payer: Self-pay

## 2022-05-31 DIAGNOSIS — C50212 Malignant neoplasm of upper-inner quadrant of left female breast: Secondary | ICD-10-CM | POA: Diagnosis not present

## 2022-05-31 LAB — RAD ONC ARIA SESSION SUMMARY
Course Elapsed Days: 7
Plan Fractions Treated to Date: 3
Plan Fractions Treated to Date: 6
Plan Prescribed Dose Per Fraction: 1.8 Gy
Plan Prescribed Dose Per Fraction: 1.8 Gy
Plan Total Fractions Prescribed: 14
Plan Total Fractions Prescribed: 28
Plan Total Prescribed Dose: 25.2 Gy
Plan Total Prescribed Dose: 50.4 Gy
Reference Point Dosage Given to Date: 10.8 Gy
Reference Point Dosage Given to Date: 10.8 Gy
Reference Point Session Dosage Given: 1.8 Gy
Reference Point Session Dosage Given: 1.8 Gy
Session Number: 6

## 2022-06-01 ENCOUNTER — Other Ambulatory Visit: Payer: Self-pay

## 2022-06-01 ENCOUNTER — Ambulatory Visit
Admission: RE | Admit: 2022-06-01 | Discharge: 2022-06-01 | Disposition: A | Discharge status: Home / Self Care | Payer: 59 | Source: Ambulatory Visit | Attending: Radiation Oncology | Admitting: Radiation Oncology

## 2022-06-01 DIAGNOSIS — Z170 Estrogen receptor positive status [ER+]: Secondary | ICD-10-CM | POA: Insufficient documentation

## 2022-06-01 DIAGNOSIS — C50212 Malignant neoplasm of upper-inner quadrant of left female breast: Secondary | ICD-10-CM | POA: Insufficient documentation

## 2022-06-01 DIAGNOSIS — Z510 Encounter for antineoplastic radiation therapy: Secondary | ICD-10-CM | POA: Insufficient documentation

## 2022-06-01 LAB — RAD ONC ARIA SESSION SUMMARY
Course Elapsed Days: 8
Plan Fractions Treated to Date: 4
Plan Fractions Treated to Date: 7
Plan Prescribed Dose Per Fraction: 1.8 Gy
Plan Prescribed Dose Per Fraction: 1.8 Gy
Plan Total Fractions Prescribed: 14
Plan Total Fractions Prescribed: 28
Plan Total Prescribed Dose: 25.2 Gy
Plan Total Prescribed Dose: 50.4 Gy
Reference Point Dosage Given to Date: 12.6 Gy
Reference Point Dosage Given to Date: 12.6 Gy
Reference Point Session Dosage Given: 1.8 Gy
Reference Point Session Dosage Given: 1.8 Gy
Session Number: 7

## 2022-06-02 ENCOUNTER — Ambulatory Visit
Admission: RE | Admit: 2022-06-02 | Discharge: 2022-06-02 | Disposition: A | Discharge status: Home / Self Care | Payer: 59 | Source: Ambulatory Visit | Attending: Radiation Oncology | Admitting: Radiation Oncology

## 2022-06-02 ENCOUNTER — Other Ambulatory Visit: Payer: Self-pay

## 2022-06-02 DIAGNOSIS — C50212 Malignant neoplasm of upper-inner quadrant of left female breast: Secondary | ICD-10-CM | POA: Diagnosis not present

## 2022-06-02 LAB — RAD ONC ARIA SESSION SUMMARY
Course Elapsed Days: 9
Plan Fractions Treated to Date: 4
Plan Fractions Treated to Date: 8
Plan Prescribed Dose Per Fraction: 1.8 Gy
Plan Prescribed Dose Per Fraction: 1.8 Gy
Plan Total Fractions Prescribed: 14
Plan Total Fractions Prescribed: 28
Plan Total Prescribed Dose: 25.2 Gy
Plan Total Prescribed Dose: 50.4 Gy
Reference Point Dosage Given to Date: 14.4 Gy
Reference Point Dosage Given to Date: 14.4 Gy
Reference Point Session Dosage Given: 1.8 Gy
Reference Point Session Dosage Given: 1.8 Gy
Session Number: 8

## 2022-06-05 ENCOUNTER — Other Ambulatory Visit: Payer: Self-pay

## 2022-06-05 ENCOUNTER — Ambulatory Visit
Admission: RE | Admit: 2022-06-05 | Discharge: 2022-06-05 | Disposition: A | Discharge status: Home / Self Care | Payer: 59 | Source: Ambulatory Visit | Attending: Radiation Oncology | Admitting: Radiation Oncology

## 2022-06-05 DIAGNOSIS — Z170 Estrogen receptor positive status [ER+]: Secondary | ICD-10-CM

## 2022-06-05 DIAGNOSIS — C50212 Malignant neoplasm of upper-inner quadrant of left female breast: Secondary | ICD-10-CM | POA: Diagnosis not present

## 2022-06-05 LAB — RAD ONC ARIA SESSION SUMMARY
Course Elapsed Days: 12
Plan Fractions Treated to Date: 5
Plan Fractions Treated to Date: 9
Plan Prescribed Dose Per Fraction: 1.8 Gy
Plan Prescribed Dose Per Fraction: 1.8 Gy
Plan Total Fractions Prescribed: 14
Plan Total Fractions Prescribed: 28
Plan Total Prescribed Dose: 25.2 Gy
Plan Total Prescribed Dose: 50.4 Gy
Reference Point Dosage Given to Date: 16.2 Gy
Reference Point Dosage Given to Date: 16.2 Gy
Reference Point Session Dosage Given: 1.8 Gy
Reference Point Session Dosage Given: 1.8 Gy
Session Number: 9

## 2022-06-05 MED ORDER — RADIAPLEXRX EX GEL: Freq: Once | CUTANEOUS | Status: AC

## 2022-06-06 ENCOUNTER — Other Ambulatory Visit: Payer: Self-pay

## 2022-06-06 ENCOUNTER — Ambulatory Visit
Admission: RE | Admit: 2022-06-06 | Discharge: 2022-06-06 | Disposition: A | Discharge status: Home / Self Care | Payer: 59 | Source: Ambulatory Visit | Attending: Radiation Oncology | Admitting: Radiation Oncology

## 2022-06-06 DIAGNOSIS — C50212 Malignant neoplasm of upper-inner quadrant of left female breast: Secondary | ICD-10-CM | POA: Diagnosis not present

## 2022-06-06 LAB — RAD ONC ARIA SESSION SUMMARY
Course Elapsed Days: 13
Plan Fractions Treated to Date: 10
Plan Fractions Treated to Date: 5
Plan Prescribed Dose Per Fraction: 1.8 Gy
Plan Prescribed Dose Per Fraction: 1.8 Gy
Plan Total Fractions Prescribed: 14
Plan Total Fractions Prescribed: 28
Plan Total Prescribed Dose: 25.2 Gy
Plan Total Prescribed Dose: 50.4 Gy
Reference Point Dosage Given to Date: 18 Gy
Reference Point Dosage Given to Date: 18 Gy
Reference Point Session Dosage Given: 1.8 Gy
Reference Point Session Dosage Given: 1.8 Gy
Session Number: 10

## 2022-06-07 ENCOUNTER — Other Ambulatory Visit: Payer: Self-pay

## 2022-06-07 ENCOUNTER — Ambulatory Visit
Admission: RE | Admit: 2022-06-07 | Discharge: 2022-06-07 | Disposition: A | Discharge status: Home / Self Care | Payer: 59 | Source: Ambulatory Visit | Attending: Radiation Oncology | Admitting: Radiation Oncology

## 2022-06-07 DIAGNOSIS — C50212 Malignant neoplasm of upper-inner quadrant of left female breast: Secondary | ICD-10-CM | POA: Diagnosis not present

## 2022-06-07 LAB — RAD ONC ARIA SESSION SUMMARY
Course Elapsed Days: 14
Plan Fractions Treated to Date: 11
Plan Fractions Treated to Date: 6
Plan Prescribed Dose Per Fraction: 1.8 Gy
Plan Prescribed Dose Per Fraction: 1.8 Gy
Plan Total Fractions Prescribed: 14
Plan Total Fractions Prescribed: 28
Plan Total Prescribed Dose: 25.2 Gy
Plan Total Prescribed Dose: 50.4 Gy
Reference Point Dosage Given to Date: 19.8 Gy
Reference Point Dosage Given to Date: 19.8 Gy
Reference Point Session Dosage Given: 1.8 Gy
Reference Point Session Dosage Given: 1.8 Gy
Session Number: 11

## 2022-06-07 LAB — SURGICAL PATHOLOGY

## 2022-06-08 ENCOUNTER — Other Ambulatory Visit: Payer: Self-pay

## 2022-06-08 ENCOUNTER — Ambulatory Visit
Admission: RE | Admit: 2022-06-08 | Discharge: 2022-06-08 | Disposition: A | Discharge status: Home / Self Care | Payer: 59 | Source: Ambulatory Visit | Attending: Radiation Oncology | Admitting: Radiation Oncology

## 2022-06-08 DIAGNOSIS — C50212 Malignant neoplasm of upper-inner quadrant of left female breast: Secondary | ICD-10-CM | POA: Diagnosis not present

## 2022-06-08 LAB — RAD ONC ARIA SESSION SUMMARY
Course Elapsed Days: 15
Plan Fractions Treated to Date: 12
Plan Fractions Treated to Date: 6
Plan Prescribed Dose Per Fraction: 1.8 Gy
Plan Prescribed Dose Per Fraction: 1.8 Gy
Plan Total Fractions Prescribed: 14
Plan Total Fractions Prescribed: 28
Plan Total Prescribed Dose: 25.2 Gy
Plan Total Prescribed Dose: 50.4 Gy
Reference Point Dosage Given to Date: 21.6 Gy
Reference Point Dosage Given to Date: 21.6 Gy
Reference Point Session Dosage Given: 1.8 Gy
Reference Point Session Dosage Given: 1.8 Gy
Session Number: 12

## 2022-06-09 ENCOUNTER — Other Ambulatory Visit: Payer: Self-pay

## 2022-06-09 ENCOUNTER — Ambulatory Visit
Admission: RE | Admit: 2022-06-09 | Discharge: 2022-06-09 | Disposition: A | Discharge status: Home / Self Care | Payer: 59 | Source: Ambulatory Visit | Attending: Radiation Oncology | Admitting: Radiation Oncology

## 2022-06-09 DIAGNOSIS — C50212 Malignant neoplasm of upper-inner quadrant of left female breast: Secondary | ICD-10-CM | POA: Diagnosis not present

## 2022-06-09 LAB — RAD ONC ARIA SESSION SUMMARY
Course Elapsed Days: 16
Plan Fractions Treated to Date: 13
Plan Fractions Treated to Date: 7
Plan Prescribed Dose Per Fraction: 1.8 Gy
Plan Prescribed Dose Per Fraction: 1.8 Gy
Plan Total Fractions Prescribed: 14
Plan Total Fractions Prescribed: 28
Plan Total Prescribed Dose: 25.2 Gy
Plan Total Prescribed Dose: 50.4 Gy
Reference Point Dosage Given to Date: 23.4 Gy
Reference Point Dosage Given to Date: 23.4 Gy
Reference Point Session Dosage Given: 1.8 Gy
Reference Point Session Dosage Given: 1.8 Gy
Session Number: 13

## 2022-06-12 ENCOUNTER — Other Ambulatory Visit: Payer: Self-pay

## 2022-06-12 ENCOUNTER — Ambulatory Visit
Admission: RE | Admit: 2022-06-12 | Discharge: 2022-06-12 | Disposition: A | Discharge status: Home / Self Care | Payer: 59 | Source: Ambulatory Visit | Attending: Radiation Oncology | Admitting: Radiation Oncology

## 2022-06-12 DIAGNOSIS — C50212 Malignant neoplasm of upper-inner quadrant of left female breast: Secondary | ICD-10-CM | POA: Diagnosis not present

## 2022-06-12 LAB — RAD ONC ARIA SESSION SUMMARY
Course Elapsed Days: 19
Plan Fractions Treated to Date: 14
Plan Fractions Treated to Date: 7
Plan Prescribed Dose Per Fraction: 1.8 Gy
Plan Prescribed Dose Per Fraction: 1.8 Gy
Plan Total Fractions Prescribed: 14
Plan Total Fractions Prescribed: 28
Plan Total Prescribed Dose: 25.2 Gy
Plan Total Prescribed Dose: 50.4 Gy
Reference Point Dosage Given to Date: 25.2 Gy
Reference Point Dosage Given to Date: 25.2 Gy
Reference Point Session Dosage Given: 1.8 Gy
Reference Point Session Dosage Given: 1.8 Gy
Session Number: 14

## 2022-06-13 ENCOUNTER — Other Ambulatory Visit: Payer: Self-pay

## 2022-06-13 ENCOUNTER — Ambulatory Visit
Admission: RE | Admit: 2022-06-13 | Discharge: 2022-06-13 | Disposition: A | Discharge status: Home / Self Care | Payer: 59 | Source: Ambulatory Visit | Attending: Radiation Oncology | Admitting: Radiation Oncology

## 2022-06-13 DIAGNOSIS — C50212 Malignant neoplasm of upper-inner quadrant of left female breast: Secondary | ICD-10-CM | POA: Diagnosis not present

## 2022-06-13 LAB — RAD ONC ARIA SESSION SUMMARY
Course Elapsed Days: 20
Plan Fractions Treated to Date: 15
Plan Fractions Treated to Date: 8
Plan Prescribed Dose Per Fraction: 1.8 Gy
Plan Prescribed Dose Per Fraction: 1.8 Gy
Plan Total Fractions Prescribed: 14
Plan Total Fractions Prescribed: 28
Plan Total Prescribed Dose: 25.2 Gy
Plan Total Prescribed Dose: 50.4 Gy
Reference Point Dosage Given to Date: 27 Gy
Reference Point Dosage Given to Date: 27 Gy
Reference Point Session Dosage Given: 1.8 Gy
Reference Point Session Dosage Given: 1.8 Gy
Session Number: 15

## 2022-06-14 ENCOUNTER — Ambulatory Visit
Admission: RE | Admit: 2022-06-14 | Discharge: 2022-06-14 | Disposition: A | Discharge status: Home / Self Care | Payer: 59 | Source: Ambulatory Visit | Attending: Radiation Oncology | Admitting: Radiation Oncology

## 2022-06-14 ENCOUNTER — Other Ambulatory Visit: Payer: Self-pay

## 2022-06-14 DIAGNOSIS — C50212 Malignant neoplasm of upper-inner quadrant of left female breast: Secondary | ICD-10-CM | POA: Diagnosis not present

## 2022-06-14 LAB — RAD ONC ARIA SESSION SUMMARY
Course Elapsed Days: 21
Plan Fractions Treated to Date: 16
Plan Fractions Treated to Date: 8
Plan Prescribed Dose Per Fraction: 1.8 Gy
Plan Prescribed Dose Per Fraction: 1.8 Gy
Plan Total Fractions Prescribed: 14
Plan Total Fractions Prescribed: 28
Plan Total Prescribed Dose: 25.2 Gy
Plan Total Prescribed Dose: 50.4 Gy
Reference Point Dosage Given to Date: 28.8 Gy
Reference Point Dosage Given to Date: 28.8 Gy
Reference Point Session Dosage Given: 1.8 Gy
Reference Point Session Dosage Given: 1.8 Gy
Session Number: 16

## 2022-06-15 ENCOUNTER — Other Ambulatory Visit: Payer: Self-pay

## 2022-06-15 ENCOUNTER — Ambulatory Visit
Admission: RE | Admit: 2022-06-15 | Discharge: 2022-06-15 | Disposition: A | Discharge status: Home / Self Care | Payer: 59 | Source: Ambulatory Visit | Attending: Radiation Oncology | Admitting: Radiation Oncology

## 2022-06-15 DIAGNOSIS — C50212 Malignant neoplasm of upper-inner quadrant of left female breast: Secondary | ICD-10-CM | POA: Diagnosis not present

## 2022-06-15 LAB — RAD ONC ARIA SESSION SUMMARY
Course Elapsed Days: 22
Plan Fractions Treated to Date: 17
Plan Fractions Treated to Date: 9
Plan Prescribed Dose Per Fraction: 1.8 Gy
Plan Prescribed Dose Per Fraction: 1.8 Gy
Plan Total Fractions Prescribed: 14
Plan Total Fractions Prescribed: 28
Plan Total Prescribed Dose: 25.2 Gy
Plan Total Prescribed Dose: 50.4 Gy
Reference Point Dosage Given to Date: 30.6 Gy
Reference Point Dosage Given to Date: 30.6 Gy
Reference Point Session Dosage Given: 1.8 Gy
Reference Point Session Dosage Given: 1.8 Gy
Session Number: 17

## 2022-06-16 ENCOUNTER — Other Ambulatory Visit: Payer: Self-pay

## 2022-06-16 ENCOUNTER — Ambulatory Visit
Admission: RE | Admit: 2022-06-16 | Discharge: 2022-06-16 | Disposition: A | Discharge status: Home / Self Care | Payer: 59 | Source: Ambulatory Visit | Attending: Radiation Oncology | Admitting: Radiation Oncology

## 2022-06-16 DIAGNOSIS — C50212 Malignant neoplasm of upper-inner quadrant of left female breast: Secondary | ICD-10-CM | POA: Diagnosis not present

## 2022-06-16 LAB — RAD ONC ARIA SESSION SUMMARY
Course Elapsed Days: 23
Plan Fractions Treated to Date: 18
Plan Fractions Treated to Date: 9
Plan Prescribed Dose Per Fraction: 1.8 Gy
Plan Prescribed Dose Per Fraction: 1.8 Gy
Plan Total Fractions Prescribed: 14
Plan Total Fractions Prescribed: 28
Plan Total Prescribed Dose: 25.2 Gy
Plan Total Prescribed Dose: 50.4 Gy
Reference Point Dosage Given to Date: 32.4 Gy
Reference Point Dosage Given to Date: 32.4 Gy
Reference Point Session Dosage Given: 1.8 Gy
Reference Point Session Dosage Given: 1.8 Gy
Session Number: 18

## 2022-06-19 ENCOUNTER — Ambulatory Visit: Payer: 59

## 2022-06-19 ENCOUNTER — Ambulatory Visit
Admission: RE | Admit: 2022-06-19 | Discharge: 2022-06-19 | Disposition: A | Discharge status: Home / Self Care | Payer: 59 | Source: Ambulatory Visit | Attending: Radiation Oncology | Admitting: Radiation Oncology

## 2022-06-19 ENCOUNTER — Other Ambulatory Visit: Payer: Self-pay

## 2022-06-19 DIAGNOSIS — C50212 Malignant neoplasm of upper-inner quadrant of left female breast: Secondary | ICD-10-CM | POA: Diagnosis not present

## 2022-06-19 LAB — RAD ONC ARIA SESSION SUMMARY
Course Elapsed Days: 26
Plan Fractions Treated to Date: 10
Plan Fractions Treated to Date: 19
Plan Prescribed Dose Per Fraction: 1.8 Gy
Plan Prescribed Dose Per Fraction: 1.8 Gy
Plan Total Fractions Prescribed: 14
Plan Total Fractions Prescribed: 28
Plan Total Prescribed Dose: 25.2 Gy
Plan Total Prescribed Dose: 50.4 Gy
Reference Point Dosage Given to Date: 34.2 Gy
Reference Point Dosage Given to Date: 34.2 Gy
Reference Point Session Dosage Given: 1.8 Gy
Reference Point Session Dosage Given: 1.8 Gy
Session Number: 19

## 2022-06-20 ENCOUNTER — Other Ambulatory Visit: Payer: Self-pay

## 2022-06-20 ENCOUNTER — Ambulatory Visit
Admission: RE | Admit: 2022-06-20 | Discharge: 2022-06-20 | Disposition: A | Discharge status: Home / Self Care | Payer: 59 | Source: Ambulatory Visit | Attending: Radiation Oncology | Admitting: Radiation Oncology

## 2022-06-20 DIAGNOSIS — C50212 Malignant neoplasm of upper-inner quadrant of left female breast: Secondary | ICD-10-CM | POA: Diagnosis not present

## 2022-06-20 LAB — RAD ONC ARIA SESSION SUMMARY
Course Elapsed Days: 27
Plan Fractions Treated to Date: 10
Plan Fractions Treated to Date: 20
Plan Prescribed Dose Per Fraction: 1.8 Gy
Plan Prescribed Dose Per Fraction: 1.8 Gy
Plan Total Fractions Prescribed: 14
Plan Total Fractions Prescribed: 28
Plan Total Prescribed Dose: 25.2 Gy
Plan Total Prescribed Dose: 50.4 Gy
Reference Point Dosage Given to Date: 36 Gy
Reference Point Dosage Given to Date: 36 Gy
Reference Point Session Dosage Given: 1.8 Gy
Reference Point Session Dosage Given: 1.8 Gy
Session Number: 20

## 2022-06-21 ENCOUNTER — Ambulatory Visit
Admission: RE | Admit: 2022-06-21 | Discharge: 2022-06-21 | Disposition: A | Discharge status: Home / Self Care | Payer: 59 | Source: Ambulatory Visit | Attending: Radiation Oncology | Admitting: Radiation Oncology

## 2022-06-21 ENCOUNTER — Other Ambulatory Visit: Payer: Self-pay

## 2022-06-21 DIAGNOSIS — C50212 Malignant neoplasm of upper-inner quadrant of left female breast: Secondary | ICD-10-CM | POA: Diagnosis not present

## 2022-06-21 LAB — RAD ONC ARIA SESSION SUMMARY
Course Elapsed Days: 28
Plan Fractions Treated to Date: 11
Plan Fractions Treated to Date: 21
Plan Prescribed Dose Per Fraction: 1.8 Gy
Plan Prescribed Dose Per Fraction: 1.8 Gy
Plan Total Fractions Prescribed: 14
Plan Total Fractions Prescribed: 28
Plan Total Prescribed Dose: 25.2 Gy
Plan Total Prescribed Dose: 50.4 Gy
Reference Point Dosage Given to Date: 37.8 Gy
Reference Point Dosage Given to Date: 37.8 Gy
Reference Point Session Dosage Given: 1.8 Gy
Reference Point Session Dosage Given: 1.8 Gy
Session Number: 21

## 2022-06-22 ENCOUNTER — Other Ambulatory Visit: Payer: Self-pay

## 2022-06-22 ENCOUNTER — Ambulatory Visit
Admission: RE | Admit: 2022-06-22 | Discharge: 2022-06-22 | Disposition: A | Discharge status: Home / Self Care | Payer: 59 | Source: Ambulatory Visit | Attending: Radiation Oncology | Admitting: Radiation Oncology

## 2022-06-22 DIAGNOSIS — C50212 Malignant neoplasm of upper-inner quadrant of left female breast: Secondary | ICD-10-CM | POA: Diagnosis not present

## 2022-06-22 LAB — RAD ONC ARIA SESSION SUMMARY
Course Elapsed Days: 29
Plan Fractions Treated to Date: 11
Plan Fractions Treated to Date: 22
Plan Prescribed Dose Per Fraction: 1.8 Gy
Plan Prescribed Dose Per Fraction: 1.8 Gy
Plan Total Fractions Prescribed: 14
Plan Total Fractions Prescribed: 28
Plan Total Prescribed Dose: 25.2 Gy
Plan Total Prescribed Dose: 50.4 Gy
Reference Point Dosage Given to Date: 39.6 Gy
Reference Point Dosage Given to Date: 39.6 Gy
Reference Point Session Dosage Given: 1.8 Gy
Reference Point Session Dosage Given: 1.8 Gy
Session Number: 22

## 2022-06-23 ENCOUNTER — Other Ambulatory Visit: Payer: Self-pay

## 2022-06-23 ENCOUNTER — Ambulatory Visit
Admission: RE | Admit: 2022-06-23 | Discharge: 2022-06-23 | Disposition: A | Discharge status: Home / Self Care | Payer: 59 | Source: Ambulatory Visit | Attending: Radiation Oncology | Admitting: Radiation Oncology

## 2022-06-23 DIAGNOSIS — C50212 Malignant neoplasm of upper-inner quadrant of left female breast: Secondary | ICD-10-CM | POA: Diagnosis not present

## 2022-06-23 LAB — RAD ONC ARIA SESSION SUMMARY
Course Elapsed Days: 30
Plan Fractions Treated to Date: 12
Plan Fractions Treated to Date: 23
Plan Prescribed Dose Per Fraction: 1.8 Gy
Plan Prescribed Dose Per Fraction: 1.8 Gy
Plan Total Fractions Prescribed: 14
Plan Total Fractions Prescribed: 28
Plan Total Prescribed Dose: 25.2 Gy
Plan Total Prescribed Dose: 50.4 Gy
Reference Point Dosage Given to Date: 41.4 Gy
Reference Point Dosage Given to Date: 41.4 Gy
Reference Point Session Dosage Given: 1.8 Gy
Reference Point Session Dosage Given: 1.8 Gy
Session Number: 23

## 2022-06-26 ENCOUNTER — Other Ambulatory Visit: Payer: Self-pay

## 2022-06-26 ENCOUNTER — Ambulatory Visit
Admission: RE | Admit: 2022-06-26 | Discharge: 2022-06-26 | Disposition: A | Discharge status: Home / Self Care | Payer: 59 | Source: Ambulatory Visit | Attending: Radiation Oncology | Admitting: Radiation Oncology

## 2022-06-26 DIAGNOSIS — C50212 Malignant neoplasm of upper-inner quadrant of left female breast: Secondary | ICD-10-CM | POA: Diagnosis not present

## 2022-06-26 DIAGNOSIS — Z170 Estrogen receptor positive status [ER+]: Secondary | ICD-10-CM

## 2022-06-26 LAB — RAD ONC ARIA SESSION SUMMARY
Course Elapsed Days: 33
Plan Fractions Treated to Date: 12
Plan Fractions Treated to Date: 24
Plan Prescribed Dose Per Fraction: 1.8 Gy
Plan Prescribed Dose Per Fraction: 1.8 Gy
Plan Total Fractions Prescribed: 14
Plan Total Fractions Prescribed: 28
Plan Total Prescribed Dose: 25.2 Gy
Plan Total Prescribed Dose: 50.4 Gy
Reference Point Dosage Given to Date: 43.2 Gy
Reference Point Dosage Given to Date: 43.2 Gy
Reference Point Session Dosage Given: 1.8 Gy
Reference Point Session Dosage Given: 1.8 Gy
Session Number: 24

## 2022-06-26 MED ORDER — RADIAPLEXRX EX GEL: Freq: Once | CUTANEOUS | Status: AC

## 2022-06-27 ENCOUNTER — Other Ambulatory Visit: Payer: Self-pay

## 2022-06-27 ENCOUNTER — Ambulatory Visit
Admission: RE | Admit: 2022-06-27 | Discharge: 2022-06-27 | Disposition: A | Discharge status: Home / Self Care | Payer: 59 | Source: Ambulatory Visit | Attending: Radiation Oncology | Admitting: Radiation Oncology

## 2022-06-27 DIAGNOSIS — C50212 Malignant neoplasm of upper-inner quadrant of left female breast: Secondary | ICD-10-CM | POA: Diagnosis not present

## 2022-06-27 LAB — RAD ONC ARIA SESSION SUMMARY
Course Elapsed Days: 34
Plan Fractions Treated to Date: 13
Plan Fractions Treated to Date: 25
Plan Prescribed Dose Per Fraction: 1.8 Gy
Plan Prescribed Dose Per Fraction: 1.8 Gy
Plan Total Fractions Prescribed: 14
Plan Total Fractions Prescribed: 28
Plan Total Prescribed Dose: 25.2 Gy
Plan Total Prescribed Dose: 50.4 Gy
Reference Point Dosage Given to Date: 45 Gy
Reference Point Dosage Given to Date: 45 Gy
Reference Point Session Dosage Given: 1.8 Gy
Reference Point Session Dosage Given: 1.8 Gy
Session Number: 25

## 2022-06-28 ENCOUNTER — Ambulatory Visit
Admission: RE | Admit: 2022-06-28 | Discharge: 2022-06-28 | Disposition: A | Discharge status: Home / Self Care | Payer: 59 | Source: Ambulatory Visit | Attending: Radiation Oncology | Admitting: Radiation Oncology

## 2022-06-28 ENCOUNTER — Other Ambulatory Visit: Payer: Self-pay

## 2022-06-28 DIAGNOSIS — C50212 Malignant neoplasm of upper-inner quadrant of left female breast: Secondary | ICD-10-CM | POA: Diagnosis not present

## 2022-06-28 LAB — RAD ONC ARIA SESSION SUMMARY
Course Elapsed Days: 35
Plan Fractions Treated to Date: 13
Plan Fractions Treated to Date: 26
Plan Prescribed Dose Per Fraction: 1.8 Gy
Plan Prescribed Dose Per Fraction: 1.8 Gy
Plan Total Fractions Prescribed: 14
Plan Total Fractions Prescribed: 28
Plan Total Prescribed Dose: 25.2 Gy
Plan Total Prescribed Dose: 50.4 Gy
Reference Point Dosage Given to Date: 46.8 Gy
Reference Point Dosage Given to Date: 46.8 Gy
Reference Point Session Dosage Given: 1.8 Gy
Reference Point Session Dosage Given: 1.8 Gy
Session Number: 26

## 2022-06-29 ENCOUNTER — Ambulatory Visit
Admission: RE | Admit: 2022-06-29 | Discharge: 2022-06-29 | Disposition: A | Discharge status: Home / Self Care | Payer: 59 | Source: Ambulatory Visit | Attending: Radiation Oncology | Admitting: Radiation Oncology

## 2022-06-29 ENCOUNTER — Other Ambulatory Visit: Payer: Self-pay

## 2022-06-29 DIAGNOSIS — C50212 Malignant neoplasm of upper-inner quadrant of left female breast: Secondary | ICD-10-CM | POA: Diagnosis not present

## 2022-06-29 LAB — RAD ONC ARIA SESSION SUMMARY
Course Elapsed Days: 36
Plan Fractions Treated to Date: 14
Plan Fractions Treated to Date: 27
Plan Prescribed Dose Per Fraction: 1.8 Gy
Plan Prescribed Dose Per Fraction: 1.8 Gy
Plan Total Fractions Prescribed: 14
Plan Total Fractions Prescribed: 28
Plan Total Prescribed Dose: 25.2 Gy
Plan Total Prescribed Dose: 50.4 Gy
Reference Point Dosage Given to Date: 48.6 Gy
Reference Point Dosage Given to Date: 48.6 Gy
Reference Point Session Dosage Given: 1.8 Gy
Reference Point Session Dosage Given: 1.8 Gy
Session Number: 27

## 2022-06-29 NOTE — Progress Notes (Signed)
Patient Care Team: Marin Olp, MD as PCP - General (Family Medicine) Tiajuana Amass, MD as Referring Physician (Allergy and Immunology) Mauro Kaufmann, RN as Oncology Nurse Navigator Rockwell Germany, RN as Oncology Nurse Navigator Nicholas Lose, MD as Consulting Physician (Hematology and Oncology)  DIAGNOSIS: No diagnosis found.  SUMMARY OF ONCOLOGIC HISTORY: Oncology History  Malignant 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, estrogen receptor positive ([place])  12/16/2021 Initial Diagnosis   Screening mammogram detected left breast distortion and calcifications in the central upper quadrant.  Ultrasound at 11 o'clock position there was a 2 cm mass.  Axilla negative.  Ultrasound-guided biopsy revealed grade 2 invasive ductal carcinoma ER 95%, PR 100%, Ki-67 10%, HER2 negative   12/28/2021 Cancer Staging   Staging form: Breast, AJCC 8th Edition - Clinical: Stage IA (cT1c, cN0, cM0, G2, ER+, PR+, HER2-) - Signed by Nicholas Lose, MD on 12/28/2021 Stage prefix: Initial diagnosis Histologic grading system: 3 grade system   02/13/2022 Cancer Staging   Staging form: Breast, AJCC 8th Edition - Pathologic: Stage IB (pT3, pN1, cM0, G2, ER+, PR+, HER2-) - Signed by Nicholas Lose, MD on 02/13/2022 Histologic grading system: 3 grade system   04/14/2022 Surgery   Left breast areolar excision: Invasive moderately differentiated adenocarcinoma grade 2 measures 2.4 cm, tumor present in the deep margin     CHIEF COMPLIANT: Follow-up left breast cancer/ radiation    INTERVAL HISTORY: Samantha Pena is a 57 y.o with the above-mentioned left breast cancer. She presents to the clinic for a follow-up after radiation.   ALLERGIES:  has No Known Allergies.  MEDICATIONS:  Current Outpatient Medications  Medication Sig Dispense Refill   ALPRAZolam (XANAX) 0.25 MG tablet Take 0.25 mg by mouth every 8 (eight) hours as needed for anxiety.     Ascorbic Acid (VITAMIN C) 1000 MG  tablet Take 1,000 mg by mouth daily as needed (immune support).     Calcium Carb-Cholecalciferol (CALTRATE 600+D3 SOFT) 600-20 MG-MCG CHEW Chew 1 tablet by mouth in the morning.     fluticasone (FLONASE) 50 MCG/ACT nasal spray Place 2 sprays into both nostrils in the morning.     ibuprofen (ADVIL) 200 MG tablet Take 400 mg by mouth every 6 (six) hours as needed for moderate pain.     Multiple Vitamin (MULTIVITAMIN WITH MINERALS) TABS tablet Take 1 tablet by mouth in the morning. Women's Multivitamin     SUMAtriptan (IMITREX) 20 MG/ACT nasal spray USE 1 SPRAY INTO EACH NOSTRIL EVERY 2 HOURS AS NEEDED (MAX TWICE PER DAY) 6 each 11   tamoxifen (NOLVADEX) 10 MG tablet TAKE 1 TABLET BY MOUTH EVERY DAY 90 tablet 1   tretinoin (RETIN-A) 0.025 % cream Apply 1 application  topically daily as needed (acne/skin blemishes).     zinc gluconate 50 MG tablet Take 50 mg by mouth daily.     zolpidem (AMBIEN) 10 MG tablet Take 10 mg by mouth at bedtime as needed for sleep.     No current facility-administered medications for this visit.    PHYSICAL EXAMINATION: ECOG PERFORMANCE STATUS: {CHL ONC ECOG [id number]}  There were no vitals filed for this visit. There were no vitals filed for this visit.  BREAST:*** No palpable masses or nodules in either right or left breasts. No palpable axillary supraclavicular or infraclavicular adenopathy no breast tenderness or nipple discharge. (exam performed in the presence of a chaperone)  LABORATORY DATA:  I have reviewed the data as listed    Latest  Ref Rng & Units 03/31/2022    2:00 PM 01/31/2022    7:29 AM 06/24/2021   11:52 AM  CMP  Glucose 70 - 99 mg/dL 91  112  120   BUN 6 - 20 mg/dL '17  11  15   '$ Creatinine 0.44 - 1.00 mg/dL 0.92  0.92  0.95   Sodium 135 - 145 mmol/L 140  138  139   Potassium 3.5 - 5.1 mmol/L 4.2  3.8  4.1   Chloride 98 - 111 mmol/L 108  108  103   CO2 22 - 32 mmol/L '27  22  31   '$ Calcium 8.9 - 10.3 mg/dL 9.5  9.0  9.3   Total Protein 6.0  - 8.3 g/dL   6.8   Total Bilirubin 0.2 - 1.2 mg/dL   0.4   Alkaline Phos 39 - 117 U/L   34   AST 0 - 37 U/L   15   ALT 0 - 35 U/L   8     Lab Results  Component Value Date   WBC 5.9 03/31/2022   HGB 12.8 03/31/2022   HCT 39.5 03/31/2022   MCV 85.9 03/31/2022   PLT 250 03/31/2022   NEUTROABS 3.1 06/24/2021    ASSESSMENT & PLAN:  No problem-specific Assessment & Plan notes found for this encounter.    No orders of the defined types were placed in this encounter.  The patient has a good understanding of the overall plan. she agrees with it. she will call with any problems that may develop before the next visit here. Total time spent: 30 mins including face to face time and time spent for planning, charting and co-ordination of care   Suzzette Righter, [place] 06/29/22    I Gardiner Coins am acting as a Education administrator for [employer]  ***

## 2022-06-30 ENCOUNTER — Other Ambulatory Visit: Payer: Self-pay

## 2022-06-30 ENCOUNTER — Ambulatory Visit
Admission: RE | Admit: 2022-06-30 | Discharge: 2022-06-30 | Disposition: A | Discharge status: Home / Self Care | Payer: 59 | Source: Ambulatory Visit | Attending: Radiation Oncology | Admitting: Radiation Oncology

## 2022-06-30 DIAGNOSIS — Z79899 Other long term (current) drug therapy: Secondary | ICD-10-CM | POA: Diagnosis not present

## 2022-06-30 DIAGNOSIS — Z9012 Acquired absence of left breast and nipple: Secondary | ICD-10-CM | POA: Diagnosis not present

## 2022-06-30 DIAGNOSIS — Z170 Estrogen receptor positive status [ER+]: Secondary | ICD-10-CM | POA: Insufficient documentation

## 2022-06-30 DIAGNOSIS — C50212 Malignant neoplasm of upper-inner quadrant of left female breast: Secondary | ICD-10-CM | POA: Insufficient documentation

## 2022-06-30 DIAGNOSIS — Z923 Personal history of irradiation: Secondary | ICD-10-CM | POA: Diagnosis not present

## 2022-06-30 DIAGNOSIS — Z510 Encounter for antineoplastic radiation therapy: Secondary | ICD-10-CM | POA: Insufficient documentation

## 2022-06-30 LAB — RAD ONC ARIA SESSION SUMMARY
Course Elapsed Days: 37
Plan Fractions Treated to Date: 14
Plan Fractions Treated to Date: 28
Plan Prescribed Dose Per Fraction: 1.8 Gy
Plan Prescribed Dose Per Fraction: 1.8 Gy
Plan Total Fractions Prescribed: 14
Plan Total Fractions Prescribed: 28
Plan Total Prescribed Dose: 25.2 Gy
Plan Total Prescribed Dose: 50.4 Gy
Reference Point Dosage Given to Date: 50.4 Gy
Reference Point Dosage Given to Date: 50.4 Gy
Reference Point Session Dosage Given: 1.8 Gy
Reference Point Session Dosage Given: 1.8 Gy
Session Number: 28

## 2022-07-03 ENCOUNTER — Other Ambulatory Visit: Payer: Self-pay

## 2022-07-03 ENCOUNTER — Ambulatory Visit
Admission: RE | Admit: 2022-07-03 | Discharge: 2022-07-03 | Disposition: A | Discharge status: Home / Self Care | Payer: 59 | Source: Ambulatory Visit | Attending: Radiation Oncology | Admitting: Radiation Oncology

## 2022-07-03 DIAGNOSIS — Z170 Estrogen receptor positive status [ER+]: Secondary | ICD-10-CM

## 2022-07-03 DIAGNOSIS — Z510 Encounter for antineoplastic radiation therapy: Secondary | ICD-10-CM | POA: Diagnosis not present

## 2022-07-03 LAB — RAD ONC ARIA SESSION SUMMARY
Course Elapsed Days: 40
Plan Fractions Treated to Date: 1
Plan Prescribed Dose Per Fraction: 2 Gy
Plan Total Fractions Prescribed: 5
Plan Total Prescribed Dose: 10 Gy
Reference Point Dosage Given to Date: 2 Gy
Reference Point Session Dosage Given: 2 Gy
Session Number: 29

## 2022-07-03 MED ORDER — RADIAPLEXRX EX GEL: Freq: Once | CUTANEOUS | Status: AC

## 2022-07-04 ENCOUNTER — Inpatient Hospital Stay: Payer: 59 | Attending: Hematology and Oncology | Admitting: Hematology and Oncology

## 2022-07-04 ENCOUNTER — Encounter: Payer: Self-pay | Admitting: *Deleted

## 2022-07-04 ENCOUNTER — Ambulatory Visit
Admission: RE | Admit: 2022-07-04 | Discharge: 2022-07-04 | Disposition: A | Discharge status: Home / Self Care | Payer: 59 | Source: Ambulatory Visit | Attending: Radiation Oncology | Admitting: Radiation Oncology

## 2022-07-04 ENCOUNTER — Other Ambulatory Visit: Payer: Self-pay

## 2022-07-04 VITALS — BP 116/78 | HR 87 | Temp 97.7°F | Resp 18 | Ht 66.0 in | Wt 154.4 lb

## 2022-07-04 DIAGNOSIS — Z170 Estrogen receptor positive status [ER+]: Secondary | ICD-10-CM

## 2022-07-04 DIAGNOSIS — Z510 Encounter for antineoplastic radiation therapy: Secondary | ICD-10-CM | POA: Insufficient documentation

## 2022-07-04 DIAGNOSIS — Z9012 Acquired absence of left breast and nipple: Secondary | ICD-10-CM | POA: Insufficient documentation

## 2022-07-04 DIAGNOSIS — Z79899 Other long term (current) drug therapy: Secondary | ICD-10-CM | POA: Insufficient documentation

## 2022-07-04 DIAGNOSIS — C50212 Malignant neoplasm of upper-inner quadrant of left female breast: Secondary | ICD-10-CM | POA: Diagnosis not present

## 2022-07-04 DIAGNOSIS — Z923 Personal history of irradiation: Secondary | ICD-10-CM | POA: Insufficient documentation

## 2022-07-04 LAB — RAD ONC ARIA SESSION SUMMARY
Course Elapsed Days: 41
Plan Fractions Treated to Date: 2
Plan Prescribed Dose Per Fraction: 2 Gy
Plan Total Fractions Prescribed: 5
Plan Total Prescribed Dose: 10 Gy
Reference Point Dosage Given to Date: 4 Gy
Reference Point Session Dosage Given: 2 Gy
Session Number: 30

## 2022-07-04 NOTE — Assessment & Plan Note (Addendum)
12/16/2021:Screening mammogram detected left breast distortion and calcifications in the central upper quadrant.  Ultrasound at 11 o'clock position there was a 2 cm mass.  Axilla negative.  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  Left mastectomy 01/31/2022: Grade 2 IDC 6 cm, 1/1 lymph node positive, ER 95%, PR 100%, HER2 negative, Ki-67 10%, nipple biopsy: Positive 2. Oncotype DX 03/01/2022: Score: 15 (distant recurrence at 9 years: 14%) 3.  Additional surgery to remove nipple  4. Adjuvant radiation therapy 05/25/2022-07/07/2022 5. Adjuvant antiestrogen therapy with tamoxifen (starting at 10 mg daily on 12/29/2021) ----------------------------------------------------------------------------------------------------------------------------- 04/14/2022: Left breast areolar excision: Invasive moderately differentiated carcinoma grade 2 measuring 2.4 cm, deep margin is positive.  Tamoxifen toxicities: Tolerating it extremely well. We briefly talked about Signatera testing for minimal residual disease and she would like to think about it.  I provided her with information on this.  Our plan is to obtain mammograms alternating with breast MRIs (breast density category D) Patient's sister was diagnosed with recurrent breast cancer and she is very emotional about all of that.  Return to clinic in 3 months for survivorship care plan visit

## 2022-07-05 ENCOUNTER — Ambulatory Visit
Admission: RE | Admit: 2022-07-05 | Discharge: 2022-07-05 | Disposition: A | Discharge status: Home / Self Care | Payer: 59 | Source: Ambulatory Visit | Attending: Radiation Oncology | Admitting: Radiation Oncology

## 2022-07-05 ENCOUNTER — Other Ambulatory Visit: Payer: Self-pay

## 2022-07-05 DIAGNOSIS — Z510 Encounter for antineoplastic radiation therapy: Secondary | ICD-10-CM | POA: Diagnosis not present

## 2022-07-05 LAB — RAD ONC ARIA SESSION SUMMARY
Course Elapsed Days: 42
Plan Fractions Treated to Date: 3
Plan Prescribed Dose Per Fraction: 2 Gy
Plan Total Fractions Prescribed: 5
Plan Total Prescribed Dose: 10 Gy
Reference Point Dosage Given to Date: 6 Gy
Reference Point Session Dosage Given: 2 Gy
Session Number: 31

## 2022-07-06 ENCOUNTER — Other Ambulatory Visit: Payer: Self-pay

## 2022-07-06 ENCOUNTER — Ambulatory Visit
Admission: RE | Admit: 2022-07-06 | Discharge: 2022-07-06 | Disposition: A | Discharge status: Home / Self Care | Payer: 59 | Source: Ambulatory Visit | Attending: Radiation Oncology | Admitting: Radiation Oncology

## 2022-07-06 DIAGNOSIS — Z510 Encounter for antineoplastic radiation therapy: Secondary | ICD-10-CM | POA: Diagnosis not present

## 2022-07-06 LAB — RAD ONC ARIA SESSION SUMMARY
Course Elapsed Days: 43
Plan Fractions Treated to Date: 4
Plan Prescribed Dose Per Fraction: 2 Gy
Plan Total Fractions Prescribed: 5
Plan Total Prescribed Dose: 10 Gy
Reference Point Dosage Given to Date: 8 Gy
Reference Point Session Dosage Given: 2 Gy
Session Number: 32

## 2022-07-07 ENCOUNTER — Other Ambulatory Visit: Payer: Self-pay

## 2022-07-07 ENCOUNTER — Ambulatory Visit
Admission: RE | Admit: 2022-07-07 | Discharge: 2022-07-07 | Disposition: A | Discharge status: Home / Self Care | Payer: 59 | Source: Ambulatory Visit | Attending: Radiation Oncology | Admitting: Radiation Oncology

## 2022-07-07 DIAGNOSIS — Z510 Encounter for antineoplastic radiation therapy: Secondary | ICD-10-CM | POA: Diagnosis not present

## 2022-07-07 LAB — RAD ONC ARIA SESSION SUMMARY
Course Elapsed Days: 44
Plan Fractions Treated to Date: 5
Plan Prescribed Dose Per Fraction: 2 Gy
Plan Total Fractions Prescribed: 5
Plan Total Prescribed Dose: 10 Gy
Reference Point Dosage Given to Date: 10 Gy
Reference Point Session Dosage Given: 2 Gy
Session Number: 33

## 2022-07-07 NOTE — Radiation Completion Notes (Signed)
Patient Name: Samantha Pena, CAULEY MRN: [id number] Date of Birth: [birth year] Referring Physician: Nicholas Lose, M.D. Date of Service: 2022-07-07 Radiation Oncologist: Eppie Gibson, M.D. [place] END OF TREATMENT NOTE     Diagnosis: C50.212 Malignant neoplasm of upper-inner quadrant of left female breast Staging on 2022-02-13: Malignant 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, estrogen receptor positive ([place]) T=pT3, N=pN1, M=cM0 Staging on 2021-12-28: Malignant 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, estrogen receptor positive ([place]) T=cT1c, N=cN0, M=cM0 Intent: Curative     ==========DELIVERED PLANS==========  First Treatment Date: 2022-05-24 - Last Treatment Date: 2022-07-07   Plan Name: CW_L_BH Site: Chest Wall, Left Technique: 3D Mode: Photon Dose Per Fraction: 1.8 Gy Prescribed Dose (Delivered / Prescribed): 25.2 Gy / 25.2 Gy Prescribed Fxs (Delivered / Prescribed): 14 / 14   Plan Name: CW_L_BH_BO Site: Chest Wall, Left Technique: 3D Mode: Photon Dose Per Fraction: 1.8 Gy Prescribed Dose (Delivered / Prescribed): 25.2 Gy / 25.2 Gy Prescribed Fxs (Delivered / Prescribed): 14 / 14   Plan Name: CW_SCV_L_BH Site: Chest Wall, Left Technique: 3D Mode: Photon Dose Per Fraction: 1.8 Gy Prescribed Dose (Delivered / Prescribed): 50.4 Gy / 50.4 Gy Prescribed Fxs (Delivered / Prescribed): 28 / 28   Plan Name: CW_L_Bst_BO Site: Chest Wall, Left Technique: Electron Mode: Electron Dose Per Fraction: 2 Gy Prescribed Dose (Delivered / Prescribed): 10 Gy / 10 Gy Prescribed Fxs (Delivered / Prescribed): 5 / 5     ==========ON TREATMENT VISIT DATES========== 2022-05-29, 2022-06-05, 2022-06-12, 2022-06-19, 2022-06-26, 2022-07-03, 2022-07-07     ==========UPCOMING VISITS==========       ==========APPENDIX - ON TREATMENT VISIT NOTES==========   See weekly On Treatment  Notes is Epic for details.

## 2022-07-10 ENCOUNTER — Other Ambulatory Visit: Payer: Self-pay | Admitting: Hematology and Oncology

## 2022-07-19 ENCOUNTER — Telehealth: Payer: Self-pay | Admitting: Family Medicine

## 2022-07-19 MED ORDER — FLUTICASONE PROPIONATE 50 MCG/ACT NA SUSP: 2.0000 | Freq: Every morning | NASAL | 3 refills | Status: DC

## 2022-07-19 NOTE — Telephone Encounter (Signed)
Rx filled

## 2022-07-19 NOTE — Telephone Encounter (Signed)
Last OV: 11/12/2018 (with pcp)/ 06/24/21 (w/[facility]) Next OV: None Medication: fluticasone (FLONASE) 50 MCG/ACT nasal spray  Pharmacy: [facility] at [address] Requests 90 day supply. Currently on last bottle.

## 2022-08-10 ENCOUNTER — Ambulatory Visit
Admission: RE | Admit: 2022-08-10 | Discharge: 2022-08-10 | Disposition: A | Discharge status: Home / Self Care | Payer: 59 | Source: Ambulatory Visit | Attending: Radiation Oncology | Admitting: Radiation Oncology

## 2022-08-10 DIAGNOSIS — Z170 Estrogen receptor positive status [ER+]: Secondary | ICD-10-CM

## 2022-08-10 NOTE — Progress Notes (Signed)
  Radiation Oncology         [phone]) [phone] ________________________________  Name: Samantha Pena MRN: [id number]  Date of Service: 08/10/2022  DOB: 02-24-1966  Post Treatment Telephone Note  The patient was available for call today.   Symptoms of fatigue have improved since completing therapy. She reports lingering tenderness to left axilla, but she attributes that to her breast expander and how she lays on her side while sleeping.   Symptoms of skin changes have improved since completing therapy. She states she didn't experience any blistering or peeling, and skin remains intact. She has some linger tanning to her axilla, but otherwise skin has started to lighten to her chest.   The patient was encouraged to avoid sun exposure in the area of prior treatment for up to one year following radiation with either sunscreen or by the style of clothing worn in the sun.  The patient has scheduled follow up with medical oncology on 10/02/2022 in the Survivorship Care Plan clinic for ongoing surveillance,. Informed patient she would follow-up with our department on an as needed basis, but encouraged to call if she develops concerns or questions regarding radiation. She verbalized understanding and agreement of plan, and denied any other questions or concerns at this time

## 2022-08-11 ENCOUNTER — Ambulatory Visit: Payer: Self-pay | Admitting: Radiation Oncology

## 2022-08-21 ENCOUNTER — Ambulatory Visit: Payer: 59 | Attending: General Surgery

## 2022-08-21 VITALS — Wt 155.0 lb

## 2022-08-21 DIAGNOSIS — Z483 Aftercare following surgery for neoplasm: Secondary | ICD-10-CM | POA: Insufficient documentation

## 2022-08-21 NOTE — Therapy (Signed)
OUTPATIENT PHYSICAL THERAPY SOZO SCREENING NOTE   Patient Name: Samantha Pena MRN: [id number] DOB:May 03, 1965, 57 y.o., female Today's Date: 08/21/2022  PCP: Shelva Majestic, MD REFERRING PROVIDER: Emelia Loron, MD   PT End of Session - 08/21/22 1507     Visit Number 11   # unchanged due to screen only   PT Start Time 1443    PT Stop Time 1448    PT Time Calculation (min) 5 min    Activity Tolerance Patient tolerated treatment well    Behavior During Therapy [facility] for tasks assessed/performed             Past Medical History:  Diagnosis Date   Anxiety    Cancer ([facility]) 2023   breast   COVID-19    2021   Cyst 01/2011   2016 stable. near rectum that gives pt discomfort when sitting and has grown in size in the past year    History of hiatal hernia    Migraines    Past Surgical History:  Procedure Laterality Date   BREAST CYST EXCISION Left 04/06/2022   Procedure: EXCISION OF LEFT NIPPLE;  Surgeon: Emelia Loron, MD;  Location: [facility];  Service: General;  Laterality: Left;   BREAST LUMPECTOMY Left 04/14/2022   Procedure: LEFT BREAST SKIN EXCISION;  Surgeon: Emelia Loron, MD;  Location[facility];  Service: General;  Laterality: Left;   BREAST RECONSTRUCTION WITH PLACEMENT OF TISSUE EXPANDER AND ALLODERM Left 01/31/2022   Procedure: BREAST RECONSTRUCTION WITH PLACEMENT OF TISSUE EXPANDER AND ALLODERM;  Surgeon: Glenna Fellows, MD;  Location: [facility];  Service: Plastics;  Laterality: Left;   CERVICAL POLYPECTOMY  02/21/2021   uterus   CESAREAN SECTION  03/18/1999   FOOT SURGERY Right 09/30/2002   bone in toe had to be shaved and realigned    LAPAROSCOPIC APPENDECTOMY N/A 10/08/2020   Procedure: APPENDECTOMY LAPAROSCOPIC;  Surgeon: Emelia Loron, MD;  [facility];  Service: General;  Laterality: N/A;   NIPPLE SPARING MASTECTOMY WITH SENTINEL LYMPH NODE BIOPSY Left 01/31/2022   Procedure: LEFT NIPPLE SPARING MASTECTOMY WITH AXILLARY SENTINEL LYMPH  NODE BIOPSY;  Surgeon: Emelia Loron, MD;  Location: [facility] OR;  Service: General;  Laterality: Left;   Patient Active Problem List   Diagnosis Date Noted   S/P left mastectomy 01/31/2022   Malignant 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, estrogen receptor positive 12/28/2021   S/P laparoscopic appendectomy 10/08/2020   History of Lyme disease 11/12/2018   Urge incontinence 11/12/2017   Overweight 05/07/2017   Allergic rhinitis 05/07/2017   Hyperlipidemia 10/31/2016   TMJ disease 03/17/2015   Anal benign neoplasm 02/14/2011   ACTINIC KERATOSIS, CHEEK, LEFT 11/13/2007   Migraine headache 10/30/2006    REFERRING DIAG: left breast cancer at risk for lymphedema  THERAPY DIAG: Aftercare following surgery for neoplasm  PERTINENT HISTORY: Patient was diagnosed on 12/19/2021 with left grade 2 invasive ductal carcinoma breast cancer. She underwent a left mastectomy with immediate reconstruction with expanders placed and a sentinel node biopsy (1/1 node positive) on 01/31/2022. It is ER/PR positive and HER2 negative with a Ki67 of 10%.   PRECAUTIONS: left UE Lymphedema risk, None  SUBJECTIVE: Pt returns for her 3 month L-Dex screen.   PAIN:  Are you having pain? No  SOZO SCREENING: Patient was assessed today using the SOZO machine to determine the lymphedema index score. This was compared to her baseline score. It was determined that she is within the recommended range when compared to her  baseline and no further action is needed at this time. She will continue SOZO screenings. These are done every 3 months for 2 years post operatively followed by every 6 months for 2 years, and then annually.   L-DEX FLOWSHEETS - 08/21/22 1500       L-DEX LYMPHEDEMA SCREENING   Measurement Type Unilateral    L-DEX MEASUREMENT EXTREMITY Upper Extremity    POSITION  Standing    DOMINANT SIDE Right    At Risk Side Left    BASELINE SCORE (UNILATERAL) -0.2    L-DEX SCORE (UNILATERAL)  5    VALUE CHANGE (UNILAT) 5.2              Hermenia Bers, PTA 08/21/2022, 3:09 PM

## 2022-10-02 ENCOUNTER — Other Ambulatory Visit: Payer: Self-pay

## 2022-10-02 ENCOUNTER — Encounter: Payer: Self-pay | Admitting: Adult Health

## 2022-10-02 ENCOUNTER — Inpatient Hospital Stay: Payer: 59 | Attending: Hematology and Oncology | Admitting: Adult Health

## 2022-10-02 VITALS — BP 125/82 | HR 84 | Temp 97.7°F | Resp 17 | Ht 66.0 in | Wt 152.4 lb

## 2022-10-02 DIAGNOSIS — Z1231 Encounter for screening mammogram for malignant neoplasm of breast: Secondary | ICD-10-CM | POA: Diagnosis not present

## 2022-10-02 DIAGNOSIS — Z923 Personal history of irradiation: Secondary | ICD-10-CM | POA: Insufficient documentation

## 2022-10-02 DIAGNOSIS — C50212 Malignant neoplasm of upper-inner quadrant of left female breast: Secondary | ICD-10-CM | POA: Insufficient documentation

## 2022-10-02 DIAGNOSIS — Z170 Estrogen receptor positive status [ER+]: Secondary | ICD-10-CM | POA: Diagnosis not present

## 2022-10-02 DIAGNOSIS — Z79810 Long term (current) use of selective estrogen receptor modulators (SERMs): Secondary | ICD-10-CM | POA: Insufficient documentation

## 2022-10-02 DIAGNOSIS — Z87891 Personal history of nicotine dependence: Secondary | ICD-10-CM | POA: Insufficient documentation

## 2022-10-02 DIAGNOSIS — Z9012 Acquired absence of left breast and nipple: Secondary | ICD-10-CM | POA: Insufficient documentation

## 2022-10-02 DIAGNOSIS — Z8616 Personal history of COVID-19: Secondary | ICD-10-CM | POA: Diagnosis not present

## 2022-10-02 DIAGNOSIS — Z803 Family history of malignant neoplasm of breast: Secondary | ICD-10-CM | POA: Diagnosis not present

## 2022-10-02 DIAGNOSIS — K449 Diaphragmatic hernia without obstruction or gangrene: Secondary | ICD-10-CM | POA: Diagnosis not present

## 2022-10-02 NOTE — Progress Notes (Signed)
SURVIVORSHIP VISIT:  BRIEF ONCOLOGIC HISTORY:  Oncology History  Malignant 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, estrogen receptor positive (HCC)  12/16/2021 Initial Diagnosis   Screening mammogram detected left breast distortion and calcifications in Samantha central upper quadrant.  Ultrasound at 11 o'clock position there was a 2 cm mass.  Axilla negative.  Ultrasound-guided biopsy revealed grade 2 invasive ductal carcinoma ER 95%, PR 100%, Ki-67 10%, HER2 negative   12/28/2021 Cancer Staging   Staging form: Breast, AJCC 8th Edition - Clinical: Stage IA (cT1c, cN0, cM0, G2, ER+, PR+, HER2-) - Signed by Serena Croissant, MD on 12/28/2021 Stage prefix: Initial diagnosis Histologic grading system: 3 grade system   01/31/2022 Surgery   Left mastectomy: Grade 2 IDC 6 cm, 1/1 lymph node positive, ER 95%, PR 100%, HER2 negative, Ki-67 10%, nipple biopsy: Positive    01/31/2022 Oncotype testing   15/14%   02/13/2022 Cancer Staging   Staging form: Breast, AJCC 8th Edition - Pathologic: Stage IB (pT3, pN1, cM0, G2, ER+, PR+, HER2-) - Signed by Serena Croissant, MD on 02/13/2022 Histologic grading system: 3 grade system   04/14/2022 Surgery   Left breast areolar excision: Invasive moderately differentiated adenocarcinoma grade 2 measures 2.4 cm, tumor present in Samantha deep margin   05/24/2022 - 07/07/2022 Radiation Therapy   Plan Name: CW_L_BH Site: Chest Wall, Left Technique: 3D Mode: Photon Dose Per Fraction: 1.8 Gy Prescribed Dose (Delivered / Prescribed): 25.2 Gy / 25.2 Gy Prescribed Fxs (Delivered / Prescribed): 14 / 14   Plan Name: CW_L_BH_BO Site: Chest Wall, Left Technique: 3D Mode: Photon Dose Per Fraction: 1.8 Gy Prescribed Dose (Delivered / Prescribed): 25.2 Gy / 25.2 Gy Prescribed Fxs (Delivered / Prescribed): 14 / 14   Plan Name: CW_SCV_L_BH Site: Chest Wall, Left Technique: 3D Mode: Photon Dose Per Fraction: 1.8 Gy Prescribed Dose (Delivered / Prescribed): 50.4  Gy / 50.4 Gy Prescribed Fxs (Delivered / Prescribed): 28 / 28   Plan Name: CW_L_Bst_BO Site: Chest Wall, Left Technique: Electron Mode: Electron Dose Per Fraction: 2 Gy Prescribed Dose (Delivered / Prescribed): 10 Gy / 10 Gy Prescribed Fxs (Delivered / Prescribed): 5 / 5   06/2022 -  Anti-estrogen oral therapy   Tamoxifen     INTERVAL HISTORY:  Samantha Pena to review her survivorship care plan detailing her treatment course for breast cancer, as well as monitoring long-term side effects of that treatment, education regarding health maintenance, screening, and overall wellness and health promotion.     Overall, Samantha Pena reports feeling quite well.  Samantha Pena notes Samantha Pena is taking tamoxifen and is still having menstrual cycles.  Samantha Pena is taking Tamoxifen at 10mg  daily.    REVIEW OF SYSTEMS:  Review of Systems  Constitutional:  Negative for appetite change, chills, fatigue, fever and unexpected weight change.  HENT:   Negative for hearing loss, lump/mass and trouble swallowing.   Eyes:  Negative for eye problems and icterus.  Respiratory:  Negative for chest tightness, cough and shortness of breath.   Cardiovascular:  Negative for chest pain, leg swelling and palpitations.  Gastrointestinal:  Negative for abdominal distention, abdominal pain, constipation, diarrhea, nausea and vomiting.  Endocrine: Negative for hot flashes.  Genitourinary:  Negative for difficulty urinating.   Musculoskeletal:  Negative for arthralgias.  Skin:  Negative for itching and rash.  Neurological:  Negative for dizziness, extremity weakness, headaches and numbness.  Hematological:  Negative for adenopathy. Does not bruise/bleed easily.  Psychiatric/Behavioral:  Negative for depression. Samantha Pena is not nervous/anxious.  Breast: Denies any new nodularity, masses, tenderness, nipple changes, or nipple discharge.       PAST MEDICAL/SURGICAL HISTORY:  Past Medical History:  Diagnosis Date   Anxiety    Cancer  (HCC) 2023   breast   COVID-19    2021   Cyst 01/2011   2016 stable. near rectum that gives pt discomfort when sitting and has grown in size in Samantha past year    History of hiatal hernia    Migraines    Past Surgical History:  Procedure Laterality Date   BREAST CYST EXCISION Left 04/06/2022   Procedure: EXCISION OF LEFT NIPPLE;  Surgeon: Emelia Loron, MD;  Location: [facility] OR;  Service: General;  Laterality: Left;   BREAST LUMPECTOMY Left 04/14/2022   Procedure: LEFT BREAST SKIN EXCISION;  Surgeon: Emelia Loron, MD;  Location: [facility] ORS;  Service: General;  Laterality: Left;   BREAST RECONSTRUCTION WITH PLACEMENT OF TISSUE EXPANDER AND ALLODERM Left 01/31/2022   Procedure: BREAST RECONSTRUCTION WITH PLACEMENT OF TISSUE EXPANDER AND ALLODERM;  Surgeon: Glenna Fellows, MD;  Location: [facility] OR;  Service: Plastics;  Laterality: Left;   CERVICAL POLYPECTOMY  02/21/2021   uterus   CESAREAN SECTION  03/18/1999   FOOT SURGERY Right 09/30/2002   bone in toe had to be shaved and realigned    LAPAROSCOPIC APPENDECTOMY N/A 10/08/2020   Procedure: APPENDECTOMY LAPAROSCOPIC;  Surgeon: Emelia Loron, MD;  Location: [facility] OR;  Service: General;  Laterality: N/A;   NIPPLE SPARING MASTECTOMY WITH SENTINEL LYMPH NODE BIOPSY Left 01/31/2022   Procedure: LEFT NIPPLE SPARING MASTECTOMY WITH AXILLARY SENTINEL LYMPH NODE BIOPSY;  Surgeon: Emelia Loron, MD;  Location: [facility] OR;  Service: General;  Laterality: Left;     ALLERGIES:  No Known Allergies   CURRENT MEDICATIONS:  Outpatient Encounter Medications as of 10/02/2022  Medication Sig   ALPRAZolam (XANAX) 0.25 MG tablet Take 0.25 mg by mouth as needed for anxiety.   Ascorbic Acid (VITAMIN C) 1000 MG tablet Take 1,000 mg by mouth daily as needed (immune support).   Calcium Carb-Cholecalciferol (CALTRATE 600+D3 SOFT) 600-20 MG-MCG CHEW Chew 1 tablet by mouth in Samantha morning.   fluticasone (FLONASE) 50 MCG/ACT nasal spray Place 2 sprays into both  nostrils in Samantha morning.   ibuprofen (ADVIL) 200 MG tablet Take 400 mg by mouth every 6 (six) hours as needed for moderate pain.   montelukast (SINGULAIR) 10 MG tablet Take 1 tablet by mouth as needed.   Multiple Vitamin (MULTIVITAMIN WITH MINERALS) TABS tablet Take 1 tablet by mouth in Samantha morning. Women's Multivitamin   SUMAtriptan (IMITREX) 20 MG/ACT nasal spray USE 1 SPRAY INTO EACH NOSTRIL EVERY 2 HOURS AS NEEDED (MAX TWICE PER DAY)   tamoxifen (NOLVADEX) 10 MG tablet TAKE 1 TABLET BY MOUTH EVERY DAY   tretinoin (RETIN-A) 0.025 % cream Apply 1 application  topically daily as needed (acne/skin blemishes).   zinc gluconate 50 MG tablet Take 50 mg by mouth daily.   zolpidem (AMBIEN) 10 MG tablet Take 10 mg by mouth at bedtime as needed for sleep.   [DISCONTINUED] oxybutynin (DITROPAN-XL) 10 MG 24 hr tablet Take 1 tablet by mouth daily. (Pena not taking: Reported on 10/02/2022)   No facility-administered encounter medications on file as of 10/02/2022.     ONCOLOGIC FAMILY HISTORY:  Family History  Problem Relation Age of Onset   Cancer Mother 110       breast cancer - passed from   Breast cancer Mother 55   Diabetes Father  50s   Hypertension Father    Breast cancer Sister 58   Epilepsy Sister    Hyperlipidemia Brother    Colon cancer Neg Hx    Esophageal cancer Neg Hx    Rectal cancer Neg Hx    Stomach cancer Neg Hx      SOCIAL HISTORY:  Social History   Socioeconomic History   Marital status: Married    Spouse name: Not on file   Number of children: 2   Years of education: Not on file   Highest education level: Not on file  Occupational History   Not on file  Tobacco Use   Smoking status: Never   Smokeless tobacco: Never  Vaping Use   Vaping Use: Never used  Substance and Sexual Activity   Alcohol use: Not Currently   Drug use: No   Sexual activity: Not on file  Other Topics Concern   Not on file  Social History Narrative   Married (mom Drucie Ip,  husband also Pena here). 2 children- girls 19 and 16 in 2016.       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: family time, enjoys working outside, has new puppy in 2016   Social Determinants of Corporate investment banker Strain: Not on file  Food Insecurity: No Food Insecurity (01/03/2022)   Hunger Vital Sign    Worried About Running Out of Food in Samantha Last Year: Never true    Ran Out of Food in Samantha Last Year: Never true  Transportation Needs: No Transportation Needs (01/03/2022)   PRAPARE - Administrator, Civil Service (Medical): No    Lack of Transportation (Non-Medical): No  Physical Activity: Not on file  Stress: Not on file  Social Connections: Not on file  Intimate Partner Violence: Not on file     OBSERVATIONS/OBJECTIVE:  BP 125/82 (BP Location: Right Arm, Pena Position: Sitting)   Pulse 84   Temp 97.7 F (36.5 C) (Temporal)   Resp 17   Ht 5\' 6"  (1.676 m)   Wt 152 lb 7 oz (69.1 kg)   LMP 09/29/2022   SpO2 100%   BMI 24.60 kg/m  GENERAL: Pena is a well appearing female in no acute distress HEENT:  Sclerae anicteric.  Oropharynx clear and moist. No ulcerations or evidence of oropharyngeal candidiasis. Neck is supple.  NODES:  No cervical, supraclavicular, or axillary lymphadenopathy palpated.  BREAST EXAM:  right breast benign, left breast s/p mastectomy, radiation, and reconstruction, no sign of local recurrence LUNGS:  Clear to auscultation bilaterally.  No wheezes or rhonchi. HEART:  Regular rate and rhythm. No murmur appreciated. ABDOMEN:  Soft, nontender.  Positive, normoactive bowel sounds. No organomegaly palpated. MSK:  No focal spinal tenderness to palpation. Full range of motion bilaterally in Samantha upper extremities. EXTREMITIES:  No peripheral edema.   SKIN:  Clear with no obvious rashes or skin changes. No nail dyscrasia. NEURO:  Nonfocal. Well oriented.  Appropriate affect.   LABORATORY DATA:  None for this visit.  DIAGNOSTIC  IMAGING:  None for this visit.      ASSESSMENT AND PLAN:  Samantha Pena is a pleasant 57 y.o. female with Stage IB left breast invasive ductal carcinoma, ER+/PR+/HER2-, diagnosed in 11/2021, treated with mastectomy, adjuvant radiation therapy, and anti-estrogen therapy with Tamoxifen beginning in 06/2022.  Samantha Pena [facility] for our initial meeting and routine follow-up post-completion of treatment for breast cancer.    1. Stage IB  left breast cancer:  Samantha Pena is continuing to recover from definitive treatment for breast cancer. Samantha Pena will follow-up with her medical oncologist, Dr.  Magnus Ivan in 8 weeks with history and physical exam per surveillance protocol.  Samantha Pena is hesitant about increasing tamoxifen until Samantha Pena has f/u with him. Her mammogram is due 11/2022; orders placed today.   Today, a comprehensive survivorship care plan and treatment summary was reviewed with Samantha Pena today detailing her breast cancer diagnosis, treatment course, potential late/long-term effects of treatment, appropriate follow-up care with recommendations for Samantha future, and Pena education resources.  A copy of this summary, along with a letter will be sent to Samantha Pena's primary care provider via mail/fax/In Basket message after today's visit.    2. Bone health:  Samantha Pena was given education on specific activities to promote bone health.  3. Cancer screening:  Due to Samantha Pena's history and her age, Samantha Pena should receive screening for skin cancers, colon cancer, and gynecologic cancers.  Samantha information and recommendations are listed on Samantha Pena's comprehensive care plan/treatment summary and were reviewed in detail with Samantha Pena.    4. Health maintenance and wellness promotion: Samantha Pena was encouraged to consume 5-7 servings of fruits and vegetables per day. We reviewed Samantha "Nutrition Rainbow" handout.  Samantha Pena was also encouraged to engage in moderate to vigorous exercise for 30 minutes per day most days of Samantha  week.  Samantha Pena was instructed to limit her alcohol consumption and continue to abstain from tobacco use.     5. Support services/counseling: It is not uncommon for this period of Samantha Pena's cancer care trajectory to be one of many emotions and stressors.   Samantha Pena was given information regarding our available services and encouraged to contact me with any questions or for help enrolling in any of our support group/programs.    Follow up instructions:    -Return to cancer center in 8 weeks for f/u with Dr. Pamelia Hoit  -Mammogram due in 11/2021 -Breast MRI in 06/2022 due to breast density category D -Samantha Pena is welcome to return back to [facility] at any time; no additional follow-up needed at this time.  -Consider referral back to survivorship as a long-term survivor for continued surveillance  Samantha Pena was provided an opportunity to ask questions and all were answered. Samantha Pena agreed with Samantha plan and demonstrated an understanding of Samantha instructions.   Total encounter time:40 minutes*in face-to-face visit time, chart review, lab review, care coordination, order entry, and documentation of Samantha encounter time.    Lillard Anes, NP 10/02/22 4:37 PM Medical Oncology and Hematology [facility] [address] [place], [place] [address] Tel. [phone]    Fax. [phone]  *Total Encounter Time as defined by Samantha Centers for Medicare and Medicaid Services includes, in addition to Samantha face-to-face time of a Pena visit (documented in Samantha note above) non-face-to-face time: obtaining and reviewing outside history, ordering and reviewing medications, tests or procedures, care coordination (communications with other health care professionals or caregivers) and documentation in Samantha medical record.

## 2022-11-16 ENCOUNTER — Other Ambulatory Visit: Payer: Self-pay | Admitting: Family Medicine

## 2022-11-20 ENCOUNTER — Ambulatory Visit: Payer: 59 | Attending: General Surgery | Admitting: Rehabilitation

## 2022-11-20 DIAGNOSIS — C50212 Malignant neoplasm of upper-inner quadrant of left female breast: Secondary | ICD-10-CM | POA: Insufficient documentation

## 2022-11-20 DIAGNOSIS — Z483 Aftercare following surgery for neoplasm: Secondary | ICD-10-CM | POA: Insufficient documentation

## 2022-11-20 DIAGNOSIS — Z170 Estrogen receptor positive status [ER+]: Secondary | ICD-10-CM | POA: Insufficient documentation

## 2022-11-20 NOTE — Therapy (Signed)
OUTPATIENT PHYSICAL THERAPY SOZO SCREENING NOTE   Patient Name: Samantha Pena MRN: [id number] DOB:November 10, 1965, 57 y.o., female Today's Date: 11/20/2022  PCP: Shelva Majestic, MD REFERRING PROVIDER: Emelia Loron, MD   PT End of Session - 11/20/22 1531     Visit Number 11   screen   PT Start Time 1529    PT Stop Time 1533    PT Time Calculation (min) 4 min    Activity Tolerance Patient tolerated treatment well    Behavior During Therapy [facility] for tasks assessed/performed             Past Medical History:  Diagnosis Date   Anxiety    Cancer (HCC) 2023   breast   COVID-19    2021   Cyst 01/2011   2016 stable. near rectum that gives pt discomfort when sitting and has grown in size in the past year    History of hiatal hernia    Migraines    Past Surgical History:  Procedure Laterality Date   BREAST CYST EXCISION Left 04/06/2022   Procedure: EXCISION OF LEFT NIPPLE;  Surgeon: Emelia Loron, MD;  Location: [facility] OR;  Service: General;  Laterality: Left;   BREAST LUMPECTOMY Left 04/14/2022   Procedure: LEFT BREAST SKIN EXCISION;  Surgeon: Emelia Loron, MD;  Location: [facility] ORS;  Service: General;  Laterality: Left;   BREAST RECONSTRUCTION WITH PLACEMENT OF TISSUE EXPANDER AND ALLODERM Left 01/31/2022   Procedure: BREAST RECONSTRUCTION WITH PLACEMENT OF TISSUE EXPANDER AND ALLODERM;  Surgeon: Glenna Fellows, MD;  Location: [facility] OR;  Service: Plastics;  Laterality: Left;   CERVICAL POLYPECTOMY  02/21/2021   uterus   CESAREAN SECTION  03/18/1999   FOOT SURGERY Right 09/30/2002   bone in toe had to be shaved and realigned    LAPAROSCOPIC APPENDECTOMY N/A 10/08/2020   Procedure: APPENDECTOMY LAPAROSCOPIC;  Surgeon: Emelia Loron, MD;  Location: [facility] OR;  Service: General;  Laterality: N/A;   NIPPLE SPARING MASTECTOMY WITH SENTINEL LYMPH NODE BIOPSY Left 01/31/2022   Procedure: LEFT NIPPLE SPARING MASTECTOMY WITH AXILLARY SENTINEL LYMPH NODE BIOPSY;  Surgeon:  Emelia Loron, MD;  Location: [facility] OR;  Service: General;  Laterality: Left;   Patient Active Problem List   Diagnosis Date Noted   S/P left mastectomy 01/31/2022   Malignant 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, estrogen receptor positive (HCC) 12/28/2021   S/P laparoscopic appendectomy 10/08/2020   History of Lyme disease 11/12/2018   Urge incontinence 11/12/2017   Overweight 05/07/2017   Allergic rhinitis 05/07/2017   Hyperlipidemia 10/31/2016   TMJ disease 03/17/2015   Anal benign neoplasm 02/14/2011   ACTINIC KERATOSIS, CHEEK, LEFT 11/13/2007   Migraine headache 10/30/2006    REFERRING DIAG: left breast cancer at risk for lymphedema  THERAPY DIAG: Aftercare following surgery for neoplasm  Malignant 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, estrogen receptor positive (HCC)  PERTINENT HISTORY: Patient was diagnosed on 12/19/2021 with left grade 2 invasive ductal carcinoma breast cancer. She underwent a left mastectomy with immediate reconstruction with expanders placed and a sentinel node biopsy (1/1 node positive) on 01/31/2022. It is ER/PR positive and HER2 negative with a Ki67 of 10%.   PRECAUTIONS: left UE Lymphedema risk, None  SUBJECTIVE: Pt returns for her 3 month L-Dex screen.   PAIN:  Are you having pain? No  SOZO SCREENING: Patient was assessed today using the SOZO machine to determine the lymphedema index score. This was compared to her baseline score. It was determined  that she is within the recommended range when compared to her baseline and no further action is needed at this time. She will continue SOZO screenings. These are done every 3 months for 2 years post operatively followed by every 6 months for 2 years, and then annually.   L-DEX FLOWSHEETS - 11/20/22 1500       L-DEX LYMPHEDEMA SCREENING   Measurement Type Unilateral    L-DEX MEASUREMENT EXTREMITY Upper Extremity    POSITION  Standing    DOMINANT SIDE Right     At Risk Side Left    BASELINE SCORE (UNILATERAL) -0.2    L-DEX SCORE (UNILATERAL) 0.9    VALUE CHANGE (UNILAT) 1.1              Carissa Musick, Julieanne Manson, PT 11/20/2022, 3:32 PM

## 2022-12-04 NOTE — Progress Notes (Signed)
Patient Care Team: Shelva Majestic, MD as PCP - General (Family Medicine) Eileen Stanford, MD as Referring Physician (Allergy and Immunology) Serena Croissant, MD as Consulting Physician (Hematology and Oncology) Lonie Peak, MD as Attending Physician (Radiation Oncology) Emelia Loron, MD as Consulting Physician (General Surgery)  DIAGNOSIS: No diagnosis found.  SUMMARY OF ONCOLOGIC HISTORY: Oncology History  Malignant 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, estrogen receptor positive (HCC)  12/16/2021 Initial Diagnosis   Screening mammogram detected left breast distortion and calcifications in the central upper quadrant.  Ultrasound at 11 o'clock position there was a 2 cm mass.  Axilla negative.  Ultrasound-guided biopsy revealed grade 2 invasive ductal carcinoma ER 95%, PR 100%, Ki-67 10%, HER2 negative   12/28/2021 Cancer Staging   Staging form: Breast, AJCC 8th Edition - Clinical: Stage IA (cT1c, cN0, cM0, G2, ER+, PR+, HER2-) - Signed by Serena Croissant, MD on 12/28/2021 Stage prefix: Initial diagnosis Histologic grading system: 3 grade system   01/31/2022 Surgery   Left mastectomy: Grade 2 IDC 6 cm, 1/1 lymph node positive, ER 95%, PR 100%, HER2 negative, Ki-67 10%, nipple biopsy: Positive    01/31/2022 Oncotype testing   15/14%   02/13/2022 Cancer Staging   Staging form: Breast, AJCC 8th Edition - Pathologic: Stage IB (pT3, pN1, cM0, G2, ER+, PR+, HER2-) - Signed by Serena Croissant, MD on 02/13/2022 Histologic grading system: 3 grade system   04/14/2022 Surgery   Left breast areolar excision: Invasive moderately differentiated adenocarcinoma grade 2 measures 2.4 cm, tumor present in the deep margin   05/24/2022 - 07/07/2022 Radiation Therapy   Plan Name: CW_L_BH Site: Chest Wall, Left Technique: 3D Mode: Photon Dose Per Fraction: 1.8 Gy Prescribed Dose (Delivered / Prescribed): 25.2 Gy / 25.2 Gy Prescribed Fxs (Delivered / Prescribed): 14 / 14   Plan  Name: CW_L_BH_BO Site: Chest Wall, Left Technique: 3D Mode: Photon Dose Per Fraction: 1.8 Gy Prescribed Dose (Delivered / Prescribed): 25.2 Gy / 25.2 Gy Prescribed Fxs (Delivered / Prescribed): 14 / 14   Plan Name: CW_SCV_L_BH Site: Chest Wall, Left Technique: 3D Mode: Photon Dose Per Fraction: 1.8 Gy Prescribed Dose (Delivered / Prescribed): 50.4 Gy / 50.4 Gy Prescribed Fxs (Delivered / Prescribed): 28 / 28   Plan Name: CW_L_Bst_BO Site: Chest Wall, Left Technique: Electron Mode: Electron Dose Per Fraction: 2 Gy Prescribed Dose (Delivered / Prescribed): 10 Gy / 10 Gy Prescribed Fxs (Delivered / Prescribed): 5 / 5   06/2022 -  Anti-estrogen oral therapy   Tamoxifen     CHIEF COMPLIANT:   INTERVAL HISTORY: Samantha Pena is a 57 y.o with the above-mentioned left breast cancer. She presents to the clinic for a follow-up    ALLERGIES:  has No Known Allergies.  MEDICATIONS:  Current Outpatient Medications  Medication Sig Dispense Refill   ALPRAZolam (XANAX) 0.25 MG tablet Take 0.25 mg by mouth as needed for anxiety.     Ascorbic Acid (VITAMIN C) 1000 MG tablet Take 1,000 mg by mouth daily as needed (immune support).     Calcium Carb-Cholecalciferol (CALTRATE 600+D3 SOFT) 600-20 MG-MCG CHEW Chew 1 tablet by mouth in the morning.     fluticasone (FLONASE) 50 MCG/ACT nasal spray Place 2 sprays into both nostrils in the morning. 16 g 3   ibuprofen (ADVIL) 200 MG tablet Take 400 mg by mouth every 6 (six) hours as needed for moderate pain.     montelukast (SINGULAIR) 10 MG tablet Take 1 tablet by mouth as needed.  Multiple Vitamin (MULTIVITAMIN WITH MINERALS) TABS tablet Take 1 tablet by mouth in the morning. Women's Multivitamin     SUMAtriptan (IMITREX) 20 MG/ACT nasal spray USE 1 SPRAY INTO EACH NOSTRIL EVERY 2 HOURS AS NEEDED (MAX TWICE PER DAY) 6 each 11   tamoxifen (NOLVADEX) 10 MG tablet TAKE 1 TABLET BY MOUTH EVERY DAY 90 tablet 1   tretinoin (RETIN-A) 0.025 % cream  Apply 1 application  topically daily as needed (acne/skin blemishes).     zinc gluconate 50 MG tablet Take 50 mg by mouth daily.     zolpidem (AMBIEN) 10 MG tablet Take 10 mg by mouth at bedtime as needed for sleep.     No current facility-administered medications for this visit.    PHYSICAL EXAMINATION: ECOG PERFORMANCE STATUS: {CHL ONC ECOG PS:[phone]}  There were no vitals filed for this visit. There were no vitals filed for this visit.  BREAST:*** No palpable masses or nodules in either right or left breasts. No palpable axillary supraclavicular or infraclavicular adenopathy no breast tenderness or nipple discharge. (exam performed in the presence of a chaperone)  LABORATORY DATA:  I have reviewed the data as listed    Latest Ref Rng & Units 03/31/2022    2:00 PM 01/31/2022    7:29 AM 06/24/2021   11:52 AM  CMP  Glucose 70 - 99 mg/dL 91  811  914   BUN 6 - 20 mg/dL 17  11  15    Creatinine 0.44 - 1.00 mg/dL 7.82  9.56  2.13   Sodium 135 - 145 mmol/L 140  138  139   Potassium 3.5 - 5.1 mmol/L 4.2  3.8  4.1   Chloride 98 - 111 mmol/L 108  108  103   CO2 22 - 32 mmol/L 27  22  31    Calcium 8.9 - 10.3 mg/dL 9.5  9.0  9.3   Total Protein 6.0 - 8.3 g/dL   6.8   Total Bilirubin 0.2 - 1.2 mg/dL   0.4   Alkaline Phos 39 - 117 U/L   34   AST 0 - 37 U/L   15   ALT 0 - 35 U/L   8     Lab Results  Component Value Date   WBC 5.9 03/31/2022   HGB 12.8 03/31/2022   HCT 39.5 03/31/2022   MCV 85.9 03/31/2022   PLT 250 03/31/2022   NEUTROABS 3.1 06/24/2021    ASSESSMENT & PLAN:  No problem-specific Assessment & Plan notes found for this encounter.    No orders of the defined types were placed in this encounter.  The patient has a good understanding of the overall plan. she agrees with it. she will call with any problems that may develop before the next visit here. Total time spent: 30 mins including face to face time and time spent for planning, charting and co-ordination of  care   Sherlyn Lick, CMA 12/04/22    I Janan Ridge am acting as a Neurosurgeon for [employer]  ***

## 2022-12-05 ENCOUNTER — Inpatient Hospital Stay: Payer: 59 | Attending: Hematology and Oncology | Admitting: Hematology and Oncology

## 2022-12-05 VITALS — BP 122/79 | HR 88 | Temp 97.2°F | Resp 18 | Ht 66.0 in | Wt 153.4 lb

## 2022-12-05 DIAGNOSIS — Z9012 Acquired absence of left breast and nipple: Secondary | ICD-10-CM | POA: Insufficient documentation

## 2022-12-05 DIAGNOSIS — C50212 Malignant neoplasm of upper-inner quadrant of left female breast: Secondary | ICD-10-CM | POA: Diagnosis not present

## 2022-12-05 DIAGNOSIS — R232 Flushing: Secondary | ICD-10-CM | POA: Diagnosis not present

## 2022-12-05 DIAGNOSIS — Z170 Estrogen receptor positive status [ER+]: Secondary | ICD-10-CM | POA: Diagnosis not present

## 2022-12-05 DIAGNOSIS — M791 Myalgia, unspecified site: Secondary | ICD-10-CM | POA: Insufficient documentation

## 2022-12-05 NOTE — Assessment & Plan Note (Addendum)
12/16/2021:Screening mammogram detected left breast distortion and calcifications in the central upper quadrant.  Ultrasound at 11 o'clock position there was a 2 cm mass.  Axilla negative.  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  Left mastectomy 01/31/2022: Grade 2 IDC 6 cm, 1/1 lymph node positive, ER 95%, PR 100%, HER2 negative, Ki-67 10%, nipple biopsy: Positive 2. Oncotype DX 03/01/2022: Score: 15 (distant recurrence at 9 years: 14%) 3.  04/14/2022: Left breast areolar excision: Invasive moderately differentiated carcinoma grade 2 measuring 2.4 cm, deep margin is positive. 4. Adjuvant radiation therapy 05/25/2022-07/07/2022 5. Adjuvant antiestrogen therapy with tamoxifen (starting at 10 mg daily on 12/29/2021) ----------------------------------------------------------------------------------------------------------------------------- Tamoxifen toxicities: Tolerating it extremely well. We briefly talked about Signatera testing for minimal residual disease and she would like to think about it.  I provided her with information on this.   Breast cancer surveillance: Breast exam 12/05/2022: Benign Mammogram scheduled for 12/08/2022 MRI of the breast will need to be scheduled for February 2025  Patient is scheduled to have breast implant exchange on 12/08/2022  Return to clinic in 1 year for follow-up Our plan is to obtain mammograms alternating with breast MRIs (breast density category D) We can consider 1 MRIs every other year. Patient's sister was diagnosed with recurrent breast cancer and had bilateral mastectomies.

## 2022-12-05 NOTE — Therapy (Signed)
OUTPATIENT PHYSICAL THERAPY  UPPER EXTREMITY ONCOLOGY EVALUATION  Patient Name: Samantha Pena MRN: [id number] DOB:1965/07/09, 57 y.o., female Today's Date: 12/06/2022  END OF SESSION:  PT End of Session - 12/06/22 1456     Visit Number 1    Number of Visits 16    Date for PT Re-Evaluation 01/31/23    PT Start Time 1500    PT Stop Time 1545    PT Time Calculation (min) 45 min    Activity Tolerance Patient tolerated treatment well    Behavior During Therapy [facility] for tasks assessed/performed             Past Medical History:  Diagnosis Date   Anxiety    Cancer (HCC) 2023   breast   COVID-19    2021   Cyst 01/2011   2016 stable. near rectum that gives pt discomfort when sitting and has grown in size in the past year    History of hiatal hernia    Migraines    Past Surgical History:  Procedure Laterality Date   BREAST CYST EXCISION Left 04/06/2022   Procedure: EXCISION OF LEFT NIPPLE;  Surgeon: Emelia Loron, MD;  Location: [facility] OR;  Service: General;  Laterality: Left;   BREAST LUMPECTOMY Left 04/14/2022   Procedure: LEFT BREAST SKIN EXCISION;  Surgeon: Emelia Loron, MD;  Location: [facility] OR[facility];  Service: General;  Laterality: Left;   BREAST RECONSTRUCTION WITH PLACEMENT OF TISSUE EXPANDER AND ALLODERM Left 01/31/2022   Procedure: BREAST RECONSTRUCTION WITH PLACEMENT OF TISSUE EXPANDER AND ALLODERM;  Surgeon: Glenna Fellows, MD;  Location: [facility] OR;  Service: Plastics;  Laterality: Left;   CERVICAL POLYPECTOMY  02/21/2021   uterus   CESAREAN SECTION  03/18/1999   FOOT SURGERY Right 09/30/2002   bone in toe had to be shaved and realigned    LAPAROSCOPIC APPENDECTOMY N/A 10/08/2020   Procedure: APPENDECTOMY LAPAROSCOPIC;  Surgeon: Emelia Loron, MD;  Location: [facility] OR;  Service: General;  Laterality: N/A;   NIPPLE SPARING MASTECTOMY WITH SENTINEL LYMPH NODE BIOPSY Left 01/31/2022   Procedure: LEFT NIPPLE SPARING MASTECTOMY WITH AXILLARY SENTINEL LYMPH NODE BIOPSY;   Surgeon: Emelia Loron, MD;  Location: [facility] OR;  Service: General;  Laterality: Left;   Patient Active Problem List   Diagnosis Date Noted   S/P left mastectomy 01/31/2022   Malignant 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, estrogen receptor positive (HCC) 12/28/2021   S/P laparoscopic appendectomy 10/08/2020   History of Lyme disease 11/12/2018   Urge incontinence 11/12/2017   Overweight 05/07/2017   Allergic rhinitis 05/07/2017   Hyperlipidemia 10/31/2016   TMJ disease 03/17/2015   Anal benign neoplasm 02/14/2011   ACTINIC KERATOSIS, CHEEK, LEFT 11/13/2007   Migraine headache 10/30/2006     REFERRING PROVIDER: Emelia Loron MD  REFERRING DIAG: Left Breast Cancer, Decreased  left shoulder ROM, Right LBP  THERAPY DIAG:  Aftercare following surgery for neoplasm  Malignant 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, estrogen receptor positive (HCC)  Abnormal posture  ONSET DATE: 2 months ago  Rationale for Evaluation and Treatment: Rehabilitation  SUBJECTIVE:  SUBJECTIVE STATEMENT:  I saw Dr. Dwain Sarna and he noticed my shoulder ROM wasn't as good.  I notice discomfort sleeping on the left side. Its tight to reach to cabinets. Right LB gets very tight especially with standing/doing dishes. She recently had a skin cancer removed from her lip, and had to plan a short notice engagement party.  PERTINENT HISTORY:  Patient was diagnosed on 12/19/2021 with left grade 2 invasive ductal carcinoma breast cancer. She underwent a left mastectomy with immediate reconstruction with expanders placed and a sentinel node biopsy (1/1 node positive) on 01/31/2022.  She had a left nipple resection on 04/14/2022.It is ER/PR positive and HER2 negative with a Ki67 of 10%.  Radiation ended 07/07/2022  PAIN:  Are you having pain? Yes NPRS scale: 3/10 Pain location: Left chest,axilla, lateral trunk, Right LB Pain orientation: Right and Left  PAIN TYPE: tight and tender, back feels like a cramp/spasm Pain description: intermittent  Aggravating factors: standing, doing dishes (for right LB), chest with movement of left UE. Relieving factors: shower/heat, motrin,  PRECAUTIONS: Left UE Lymphedema Risk  RED FLAGS: None   WEIGHT BEARING RESTRICTIONS: No  FALLS:  Has patient fallen in last 6 months? No  LIVING ENVIRONMENT: Lives with: lives with their spouse Lives in: House/apartment Has following equipment at home: None  OCCUPATION: not presently working  LEISURE: ride stationary bike,walking,music   HAND DOMINANCE: right   PRIOR LEVEL OF FUNCTION: Independent  PATIENT GOALS: Return to feeling more comfortable and loose, Better ROM   OBJECTIVE:  COGNITION: Overall cognitive status: Within functional limits for tasks assessed   PALPATION: Tight tender left pectorals, UT, lateral trunk, Right lumbar paraspinals  OBSERVATIONS / OTHER ASSESSMENTS: slumped sitting posture  SENSATION: Light touch: Deficits    POSTURE: forward head ,rounded shoulders  UPPER EXTREMITY AROM/PROM:  A/PROM RIGHT   eval   Shoulder extension 60  Shoulder flexion 165  Shoulder abduction 150  Shoulder internal rotation 63  Shoulder external rotation 104    (Blank rows = not tested)  A/PROM LEFT   eval  Shoulder extension 50 tight  Shoulder flexion 132  Shoulder abduction 118  Shoulder internal rotation 70  Shoulder external rotation 90    (Blank rows = not tested)  CERVICAL AROM: All within functional limits:   LUMBAR ROM: FB felt tight, left SB felt tight on right, Right SB -  UPPER EXTREMITY STRENGTH:   LYMPHEDEMA ASSESSMENTS:   SURGERY TYPE/DATE: Left Mastectomy with Reconstruction 01/31/2022, Left Nipple Reconstruction 04/14/22  NUMBER OF  LYMPH NODES REMOVED: 1/1  CHEMOTHERAPY: NO  RADIATION:to axilla only  HORMONE TREATMENT: Tamoxifen  INFECTIONS: NO   LYMPHEDEMA ASSESSMENTS:   LANDMARK RIGHT  eval  At axilla    15 cm proximal to olecranon process   10 cm proximal to olecranon process   Olecranon process   15 cm proximal to ulnar styloid process   10 cm proximal to ulnar styloid process   Just proximal to ulnar styloid process   Across hand at thumb web space   At base of 2nd digit   (Blank rows = not tested)  LANDMARK LEFT  eval  At axilla    15 cm proximal to olecranon process   10 cm proximal to olecranon process   Olecranon process   15 cm proximal to ulnar styloid process   10 cm proximal to ulnar styloid process   Just proximal to ulnar styloid process   Across hand at thumb web space   At base of  2nd digit   (Blank rows = not tested)   FUNCTIONAL TESTS:    GAIT:: WNL  QUICK DASH SURVEY: 20%   TODAY'S TREATMENT:                                                                                                                                          DATE:  Reviewed Supine wand flexion and scaption x 5, stargazer x 5, LTR x 5,  Updated HEP. Discussed POC;will schedule before expander exchange on 12/26/2022 and a week after depending on when MD releases her.    PATIENT EDUCATION:  Access Code: [email or web address] URL: [email or web address] Date: 12/06/2022 Prepared by: Alvira Monday  Exercises - Single Arm Doorway Pec Stretch at 90 Degrees Abduction  - 2 x daily - 7 x weekly - 1 sets - 3 reps - 20-30 hold - Standing Shoulder and Trunk Flexion at Table  - 2 x daily - 7 x weekly - 1 sets - 3 reps - 20-30 hold - Supine Shoulder Flexion with Dowel  - 2 x daily - 7 x weekly - 5 reps - 10 hold - Supine Chest Stretch with Elbows Bent  - 2 x daily - 7 x weekly - 1 sets - 5 reps - 10 hold - Supine Lower Trunk Rotation  - 2 x daily - 7 x weekly - 1 sets - 3 reps - 10 hold Education  details: see above Person educated: Patient Education method: Explanation, Demonstration, and Handouts Education comprehension: verbalized understanding and returned demonstration  HOME EXERCISE PROGRAM: Single Arm Doorway Pec Stretch at 90 Degrees Abduction  - 2 x daily - 7 x weekly - 1 sets - 3 reps - 20-30 hold - Standing Shoulder and Trunk Flexion at Table  - 2 x daily - 7 x weekly - 1 sets - 3 reps - 20-30 hold - Supine Shoulder Flexion with Dowel  - 2 x daily - 7 x weekly - 5 reps - 10 hold - Supine Chest Stretch with Elbows Bent  - 2 x daily - 7 x weekly - 1 sets - 5 reps - 10 hold - Supine Lower Trunk Rotation  - 2 x daily - 7 x weekly - 1 sets - 3 reps - 10 hold  ASSESSMENT:  CLINICAL IMPRESSION: Patient is a 57 y.o. female who was seen today for physical therapy evaluation and treatment for limitations in left shoulder ROM s/p Left Mastectomy with tissue expander on 01/31/2022, and Nipple excision on 04/14/2022. Radiation performed to axilla and ended on 07/07/2022. Pt noticed progressive tightness especially in the left pectorals with limitations in Left UE ROM several months ago, and was also identified by MD last week. Pt. Presents with discomfort at right lateral trunk as well some of which is due to her expander.. She also has tightness in Right low back with tenderness to deep palpation. She will benefit from skilled  PT to address deficits and return to PLOF.   OBJECTIVE IMPAIRMENTS: decreased knowledge of condition, decreased ROM, increased muscle spasms, impaired UE functional use, and pain.   ACTIVITY LIMITATIONS: standing, sleeping, and reach over head  PARTICIPATION LIMITATIONS:  able to participate in most activities but limited with reaching,standing  PERSONAL FACTORS: 1-2 comorbidities: Left breast cancer s/p Mastectomy with tissue expander, radiation  are also affecting patient's functional outcome.   REHAB POTENTIAL: Excellent  CLINICAL DECISION MAKING:  Stable/uncomplicated  EVALUATION COMPLEXITY: Low  GOALS: Goals reviewed with patient? Yes  SHORT TERM GOALS: Target date: 01/03/2023  Pt will be independent and compliant with initial HEP to improve shoulder ROM Baseline: Goal status: INITIAL  2.  Pt will have decreased pain/tightness by 25% Baseline:  Goal status: INITIAL  3.  Pt will have improved shoulder flexion and abduction by atleast 15 degrees Baseline:  Goal status: INITIAL   LONG TERM GOALS: Target date: 01/31/2023  Pts quick dash will be improved to no greater than 10% Baseline:  Goal status: INITIAL  2.  Pt will have left shoulder flexion 160 degrees and abd 145 for improved reaching ability Baseline:  Goal status: INITIAL  3.  Pt will be able to lie comfortably on her left side Baseline:  Goal status: INITIAL  4.  Pt will be able to stand longer/do dishes with min complaint of right LBP Baseline:  Goal status: INITIAL   PLAN:  PT FREQUENCY: 2x/week  PT DURATION: 8 weeks  PLANNED INTERVENTIONS: Therapeutic exercises, Neuromuscular re-education, Patient/Family education, Self Care, Dry Needling, Manual therapy, and Re-evaluation  PLAN FOR NEXT SESSION: pulleys, wall slides, ball rolls on wall, STM Left pectorals, UT, Lat. Trunk, Right LB, Postural education,PROM left shoulder, progress to strength when ready  Waynette Buttery, PT 12/06/2022, 4:14 PM

## 2022-12-06 ENCOUNTER — Ambulatory Visit: Payer: 59 | Attending: General Surgery

## 2022-12-06 ENCOUNTER — Other Ambulatory Visit: Payer: Self-pay

## 2022-12-06 DIAGNOSIS — C50212 Malignant neoplasm of upper-inner quadrant of left female breast: Secondary | ICD-10-CM | POA: Diagnosis present

## 2022-12-06 DIAGNOSIS — R293 Abnormal posture: Secondary | ICD-10-CM | POA: Diagnosis not present

## 2022-12-06 DIAGNOSIS — Z170 Estrogen receptor positive status [ER+]: Secondary | ICD-10-CM

## 2022-12-06 DIAGNOSIS — Z483 Aftercare following surgery for neoplasm: Secondary | ICD-10-CM | POA: Diagnosis not present

## 2022-12-06 DIAGNOSIS — I890 Lymphedema, not elsewhere classified: Secondary | ICD-10-CM | POA: Diagnosis not present

## 2022-12-06 DIAGNOSIS — M25612 Stiffness of left shoulder, not elsewhere classified: Secondary | ICD-10-CM

## 2022-12-07 ENCOUNTER — Ambulatory Visit: Payer: 59

## 2022-12-08 ENCOUNTER — Other Ambulatory Visit: Payer: Self-pay | Admitting: Adult Health

## 2022-12-08 ENCOUNTER — Ambulatory Visit: Admission: RE | Admit: 2022-12-08 | Payer: 59 | Source: Ambulatory Visit

## 2022-12-08 DIAGNOSIS — Z170 Estrogen receptor positive status [ER+]: Secondary | ICD-10-CM

## 2022-12-08 DIAGNOSIS — Z1231 Encounter for screening mammogram for malignant neoplasm of breast: Secondary | ICD-10-CM

## 2022-12-11 ENCOUNTER — Other Ambulatory Visit: Payer: Self-pay | Admitting: Family Medicine

## 2022-12-13 NOTE — H&P (Signed)
Subjective:      HPI   10 mo post left mastectomy with immediate reconstruction. Plan second stage breast reconstruction this month.      Presented following screening MMG with left breast distortion. Exam demonstrated palpable mass. Diagnostic MMG/US showed a 2 cm mass 11 o clock,  3 cmfn, corresponding to the palpable mass. Persistent distortion with calcs noted. Axilla normal. Biopsies labeled left breast 11 o clock showed IDC with DCIS, left breast UIQ IDC with DCIS, left breast, ER/PR+, Her2-.   MRI demonstrated left breast UIQ NME with an area of extension into the UOQ, spanning 4.7 x 3.1 cm x 3.4 cm. A single left axillary LN noted to have eccentric thickening. MR guided biopsy labeled left UOQ shoed IDC with DCIS, +LVI.   Given span of disease mastectomy recommended. Final pathology 6 cm IDC, one LN positive for carcinoma within mastectomy specimen, 1/1 SLN +, left nipple biopsy positive for carcinoma, other margins negative. She underwent excision left NAC with pathology demonstrating 2.4 cm IDC, deep margin positive. Oncotype 15. Completed adjuvant radiation 3.8.24. On tamoxifen.   Mother passed from breast cancer. Sister with breast ca age 84. Prior genetic testing negative. Mother had lumpectomy, sister had lumpectomy followed by mastectomy. Patient believes she had expander placement, note her sister has epilepsy and does not recall all details of her medical history. Sister has experienced recurrence.   Prior 36 B. Left mastectomy 283 g   MMG 12/08/2022 [id number]   Works at [employer] part time. Lives with spouse. Has one adult son in [place] and another in [place].   Review of Systems     Objective:  Physical Exam    CV: normal heart sounds normal rate Pulm: clear to auscultation bilateral   Chest:  Left chest expanded, soft, hyperpigmentation present Right breast grade 1 ptosis   Abd: no hernias (patient reports hiatal hernia) sufficient soft tissue for  liposuction flanks abdomen     Assessment:    Left breast cancer UIQ ER+ S/p left NSM, SLN, prepectoral TE/ADM (Alloderm) reconstruction S/p excision left NAC Adjuvant radiation   Plan:    Hold tamoxifen week prior to surgery.    Reviewed RT significantly increases risk reconstruction including wound healing problems capsular contracture. Counseled encouraging data from prepectoral position regarding this, however some type of autologous tissue as part of reconstruction may reduce risks. Reviewed timing of implant exchange approximately 3-6 months from end of radiation.  At that time could do implant exchange alone, implant exchange with LD flap for radiated chest, or coversion to autologous. Counseled I anticipate asymmetry breasts given RT especially in implant based setting. Patient has decided on implant exchange alone.   Discussed saline vs silicone, smooth vs textured, shaped vs round. As in prepectoral position recommend capacity filled or HCG implants to minimize visible rippling. Reviewed risks BIA ALCL with textured devices and new reports of SCC related to implants in general. Reviewed risks rupture contracture. Reviewed implants are not permanent devices and may require additional surgery related to them. Patient has elected for saline. Plan smooth round.    Over right breast, if she desires overall larger volume breasts and if she is bothered by the asymmetry upper pole fullness can consider right augmentation. Reviewed even if she pursues right augmentation expect asymmetry. Reviewed with aging will develop soft tissue ptosis over implant and may need additional surgery for breast lift. Patient desires to proceed with right augmentation. Sizing repeated today and patient liked 175 ml over  right breast. Plan subfascial placement.   Reviewed possible fat grafting. Reviewed purpose of this to thicken flaps limit visible rippling and aid in contour. Reviewed donor site pain, need for  compression, variable take graft, fat necrosis that presents as masses and may require work up. Patient declines this at this time.   Additional risks including but not limited to bleeding, hematoma, seroma, need for additional procedures, asymmetry, unacceptable cosmetic result, damage to adjacent structures, blood clots in legs or lungs reviewed. Completed Natrelle Saline physician patient checklist.   Rx for Bactrim and robaxin given. Patient has oxycodone at home.  Glenna Fellows, MD [facility]astic [facility] Surgery  Office/ physician access line after hours [phone]     Natrelle 133S FV 11 T 300 ml tissue expander placed,   fill volume 230 ml saline

## 2022-12-18 ENCOUNTER — Other Ambulatory Visit: Payer: Self-pay

## 2022-12-18 ENCOUNTER — Encounter (HOSPITAL_BASED_OUTPATIENT_CLINIC_OR_DEPARTMENT_OTHER): Payer: Self-pay | Admitting: Plastic Surgery

## 2022-12-19 ENCOUNTER — Ambulatory Visit: Payer: 59

## 2022-12-19 DIAGNOSIS — Z170 Estrogen receptor positive status [ER+]: Secondary | ICD-10-CM

## 2022-12-19 DIAGNOSIS — Z483 Aftercare following surgery for neoplasm: Secondary | ICD-10-CM

## 2022-12-19 DIAGNOSIS — C50212 Malignant neoplasm of upper-inner quadrant of left female breast: Secondary | ICD-10-CM

## 2022-12-19 DIAGNOSIS — M25612 Stiffness of left shoulder, not elsewhere classified: Secondary | ICD-10-CM

## 2022-12-19 DIAGNOSIS — R293 Abnormal posture: Secondary | ICD-10-CM

## 2022-12-19 MED ORDER — CHLORHEXIDINE GLUCONATE CLOTH 2 % EX PADS: 6.0000 | MEDICATED_PAD | Freq: Once | CUTANEOUS | Status: DC

## 2022-12-19 NOTE — Therapy (Signed)
OUTPATIENT PHYSICAL THERAPY  UPPER EXTREMITY ONCOLOGY EVALUATION  Patient Name: Samantha Pena MRN: [id number] DOB:1965-05-05, 57 y.o., female Today's Date: 12/19/2022  END OF SESSION:  PT End of Session - 12/19/22 1100     Visit Number 2    Number of Visits 16    Date for PT Re-Evaluation 01/31/23    PT Start Time 1102    PT Stop Time 1155    PT Time Calculation (min) 53 min    Activity Tolerance Patient tolerated treatment well    Behavior During Therapy [facility] for tasks assessed/performed             Past Medical History:  Diagnosis Date   Anxiety    Cancer (HCC) 2023   breast   COVID-19    2021   Cyst 01/2011   2016 stable. near rectum that gives pt discomfort when sitting and has grown in size in the past year    History of hiatal hernia    Migraines    Past Surgical History:  Procedure Laterality Date   BREAST CYST EXCISION Left 04/06/2022   Procedure: EXCISION OF LEFT NIPPLE;  Surgeon: Emelia Loron, MD;  Location: [facility] OR;  Service: General;  Laterality: Left;   BREAST LUMPECTOMY Left 04/14/2022   Procedure: LEFT BREAST SKIN EXCISION;  Surgeon: Emelia Loron, MD;  Location: [facility] ORS;  Service: General;  Laterality: Left;   BREAST RECONSTRUCTION WITH PLACEMENT OF TISSUE EXPANDER AND ALLODERM Left 01/31/2022   Procedure: BREAST RECONSTRUCTION WITH PLACEMENT OF TISSUE EXPANDER AND ALLODERM;  Surgeon: Glenna Fellows, MD;  Location: [facility] OR;  Service: Plastics;  Laterality: Left;   CERVICAL POLYPECTOMY  02/21/2021   uterus   CESAREAN SECTION  03/18/1999   FOOT SURGERY Right 09/30/2002   bone in toe had to be shaved and realigned    LAPAROSCOPIC APPENDECTOMY N/A 10/08/2020   Procedure: APPENDECTOMY LAPAROSCOPIC;  Surgeon: Emelia Loron, MD;  Location: [facility] OR;  Service: General;  Laterality: N/A;   NIPPLE SPARING MASTECTOMY WITH SENTINEL LYMPH NODE BIOPSY Left 01/31/2022   Procedure: LEFT NIPPLE SPARING MASTECTOMY WITH AXILLARY SENTINEL LYMPH NODE BIOPSY;   Surgeon: Emelia Loron, MD;  Location: [facility] OR;  Service: General;  Laterality: Left;   Patient Active Problem List   Diagnosis Date Noted   S/P left mastectomy 01/31/2022   Malignant 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, estrogen receptor positive (HCC) 12/28/2021   S/P laparoscopic appendectomy 10/08/2020   History of Lyme disease 11/12/2018   Urge incontinence 11/12/2017   Overweight 05/07/2017   Allergic rhinitis 05/07/2017   Hyperlipidemia 10/31/2016   TMJ disease 03/17/2015   Anal benign neoplasm 02/14/2011   ACTINIC KERATOSIS, CHEEK, LEFT 11/13/2007   Migraine headache 10/30/2006     REFERRING PROVIDER: Emelia Loron MD  REFERRING DIAG: Left Breast Cancer, Decreased  left shoulder ROM, Right LBP  THERAPY DIAG:  Aftercare following surgery for neoplasm  Malignant 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, estrogen receptor positive (HCC)  Abnormal posture  Stiffness of left shoulder, not elsewhere classified  ONSET DATE: 2 months ago  Rationale for Evaluation and Treatment: Rehabilitation  SUBJECTIVE:  SUBJECTIVE STATEMENT:  I have been doing the exercises and the axilla is less tight but still feel tight at lateral trunk.  PERTINENT HISTORY:  Patient was diagnosed on 12/19/2021 with left grade 2 invasive ductal carcinoma breast cancer. She underwent a left mastectomy with immediate reconstruction with expanders placed and a sentinel node biopsy (1/1 node positive) on 01/31/2022.  She had a left nipple resection on 04/14/2022.It is ER/PR positive and HER2 negative with a Ki67 of 10%. Radiation ended 07/07/2022  PAIN:  Are you having pain? No, more discomfort today Pain location: Left chest,axilla, lateral trunk, Right LB Pain orientation:  Right and Left  PAIN TYPE: tight and tender, back feels like a cramp/spasm Pain description: intermittent  Aggravating factors: standing, doing dishes (for right LB), chest with movement of left UE. Relieving factors: shower/heat, motrin,  PRECAUTIONS: Left UE Lymphedema Risk  RED FLAGS: None   WEIGHT BEARING RESTRICTIONS: No  FALLS:  Has patient fallen in last 6 months? No  LIVING ENVIRONMENT: Lives with: lives with their spouse Lives in: House/apartment Has following equipment at home: None  OCCUPATION: not presently working  LEISURE: ride stationary bike,walking,music   HAND DOMINANCE: right   PRIOR LEVEL OF FUNCTION: Independent  PATIENT GOALS: Return to feeling more comfortable and loose, Better ROM   OBJECTIVE:  COGNITION: Overall cognitive status: Within functional limits for tasks assessed   PALPATION: Tight tender left pectorals, UT, lateral trunk, Right lumbar paraspinals  OBSERVATIONS / OTHER ASSESSMENTS: slumped sitting posture  SENSATION: Light touch: Deficits    POSTURE: forward head ,rounded shoulders  UPPER EXTREMITY AROM/PROM:  A/PROM RIGHT   eval   Shoulder extension 60  Shoulder flexion 165  Shoulder abduction 150  Shoulder internal rotation 63  Shoulder external rotation 104    (Blank rows = not tested)  A/PROM LEFT   eval  Shoulder extension 50 tight  Shoulder flexion 132  Shoulder abduction 118  Shoulder internal rotation 70  Shoulder external rotation 90    (Blank rows = not tested)  CERVICAL AROM: All within functional limits:   LUMBAR ROM: FB felt tight, left SB felt tight on right, Right SB -  UPPER EXTREMITY STRENGTH:   LYMPHEDEMA ASSESSMENTS:   SURGERY TYPE/DATE: Left Mastectomy with Reconstruction 01/31/2022, Left Nipple Reconstruction 04/14/22  NUMBER OF LYMPH NODES REMOVED: 1/1  CHEMOTHERAPY: NO  RADIATION:to axilla only  HORMONE TREATMENT: Tamoxifen  INFECTIONS: NO   LYMPHEDEMA ASSESSMENTS:    LANDMARK RIGHT  eval  At axilla    15 cm proximal to olecranon process   10 cm proximal to olecranon process   Olecranon process   15 cm proximal to ulnar styloid process   10 cm proximal to ulnar styloid process   Just proximal to ulnar styloid process   Across hand at thumb web space   At base of 2nd digit   (Blank rows = not tested)  LANDMARK LEFT  eval  At axilla    15 cm proximal to olecranon process   10 cm proximal to olecranon process   Olecranon process   15 cm proximal to ulnar styloid process   10 cm proximal to ulnar styloid process   Just proximal to ulnar styloid process   Across hand at thumb web space   At base of 2nd digit   (Blank rows = not tested)   FUNCTIONAL TESTS:    GAIT:: WNL  QUICK DASH SURVEY: 20%   TODAY'S TREATMENT:  DATE:  12/19/2022 Therapeutic Exercises Pulleys flexion and abduction x 2:30, ball rolls x 10 flexion Right SL with left shoulder abduction x 5 Supine on half foam roll;Bilateral shoulder flexion, scaption, horizontal abd x 5 MANUAL STM left UT/pectorals/lateral trunk and in SL to left UT scapular area with cocoa butter, and to right LB in left SL. PROM left shoulder flexion,scaption, abd, IR and ER   12/06/2022 Reviewed Supine wand flexion and scaption x 5, stargazer x 5, LTR x 5,  Updated HEP. Discussed POC;will schedule before expander exchange on 12/26/2022 and a week after depending on when MD releases her.    PATIENT EDUCATION:  Access Code: [email or web address] URL: [email or web address] Date: 12/06/2022 Prepared by: Alvira Monday  Exercises - Single Arm Doorway Pec Stretch at 90 Degrees Abduction  - 2 x daily - 7 x weekly - 1 sets - 3 reps - 20-30 hold - Standing Shoulder and Trunk Flexion at Table  - 2 x daily - 7 x weekly - 1 sets - 3 reps - 20-30 hold - Supine  Shoulder Flexion with Dowel  - 2 x daily - 7 x weekly - 5 reps - 10 hold - Supine Chest Stretch with Elbows Bent  - 2 x daily - 7 x weekly - 1 sets - 5 reps - 10 hold - Supine Lower Trunk Rotation  - 2 x daily - 7 x weekly - 1 sets - 3 reps - 10 hold Education details: see above Person educated: Patient Education method: Explanation, Demonstration, and Handouts Education comprehension: verbalized understanding and returned demonstration  HOME EXERCISE PROGRAM: Single Arm Doorway Pec Stretch at 90 Degrees Abduction  - 2 x daily - 7 x weekly - 1 sets - 3 reps - 20-30 hold - Standing Shoulder and Trunk Flexion at Table  - 2 x daily - 7 x weekly - 1 sets - 3 reps - 20-30 hold - Supine Shoulder Flexion with Dowel  - 2 x daily - 7 x weekly - 5 reps - 10 hold - Supine Chest Stretch with Elbows Bent  - 2 x daily - 7 x weekly - 1 sets - 5 reps - 10 hold - Supine Lower Trunk Rotation  - 2 x daily - 7 x weekly - 1 sets - 3 reps - 10 hold  ASSESSMENT:  CLINICAL IMPRESSION:  Pt is compliant with exercises for ROM but still feels very tight at lateral trunk/ribs. Therapy included soft tissue mobilization, PROM and AROM activities. She is very tight in left pectorals and left serratus, and had tenderness there and in the right lumbar paraspinals. She loved the half foam roll and reported feeling better after treatment today.  OBJECTIVE IMPAIRMENTS: decreased knowledge of condition, decreased ROM, increased muscle spasms, impaired UE functional use, and pain.   ACTIVITY LIMITATIONS: standing, sleeping, and reach over head  PARTICIPATION LIMITATIONS:  able to participate in most activities but limited with reaching,standing  PERSONAL FACTORS: 1-2 comorbidities: Left breast cancer s/p Mastectomy with tissue expander, radiation  are also affecting patient's functional outcome.   REHAB POTENTIAL: Excellent  CLINICAL DECISION MAKING: Stable/uncomplicated  EVALUATION COMPLEXITY: Low  GOALS: Goals  reviewed with patient? Yes  SHORT TERM GOALS: Target date: 01/03/2023  Pt will be independent and compliant with initial HEP to improve shoulder ROM Baseline: Goal status: INITIAL  2.  Pt will have decreased pain/tightness by 25% Baseline:  Goal status: INITIAL  3.  Pt will have improved shoulder flexion and abduction by atleast 15 degrees Baseline:  Goal  status: INITIAL   LONG TERM GOALS: Target date: 01/31/2023  Pts quick dash will be improved to no greater than 10% Baseline:  Goal status: INITIAL  2.  Pt will have left shoulder flexion 160 degrees and abd 145 for improved reaching ability Baseline:  Goal status: INITIAL  3.  Pt will be able to lie comfortably on her left side Baseline:  Goal status: INITIAL  4.  Pt will be able to stand longer/do dishes with min complaint of right LBP Baseline:  Goal status: INITIAL   PLAN:  PT FREQUENCY: 2x/week  PT DURATION: 8 weeks  PLANNED INTERVENTIONS: Therapeutic exercises, Neuromuscular re-education, Patient/Family education, Self Care, Dry Needling, Manual therapy, and Re-evaluation  PLAN FOR NEXT SESSION: pulleys, wall slides, ball rolls on wall, STM Left pectorals, UT, Lat. Trunk, Right LB, Postural education,PROM left shoulder, progress to strength when ready  Waynette Buttery, PT 12/19/2022, 12:02 PM

## 2022-12-19 NOTE — Progress Notes (Signed)

## 2022-12-20 ENCOUNTER — Ambulatory Visit: Payer: 59 | Attending: General Surgery

## 2022-12-20 DIAGNOSIS — M25612 Stiffness of left shoulder, not elsewhere classified: Secondary | ICD-10-CM | POA: Diagnosis present

## 2022-12-20 DIAGNOSIS — Z170 Estrogen receptor positive status [ER+]: Secondary | ICD-10-CM | POA: Insufficient documentation

## 2022-12-20 DIAGNOSIS — Z483 Aftercare following surgery for neoplasm: Secondary | ICD-10-CM | POA: Diagnosis present

## 2022-12-20 DIAGNOSIS — R293 Abnormal posture: Secondary | ICD-10-CM

## 2022-12-20 DIAGNOSIS — C50212 Malignant neoplasm of upper-inner quadrant of left female breast: Secondary | ICD-10-CM | POA: Diagnosis present

## 2022-12-20 NOTE — Therapy (Signed)
OUTPATIENT PHYSICAL THERAPY  UPPER EXTREMITY ONCOLOGY EVALUATION  Patient Name: Samantha Pena MRN: [id number] DOB:[birth year], 57 y.o., female Today's Date: 12/20/2022  END OF SESSION:  PT End of Session - 12/20/22 1354     Visit Number 3    Number of Visits 16    Date for PT Re-Evaluation 01/31/23    PT Start Time 1400    PT Stop Time 1454    PT Time Calculation (min) 54 min    Activity Tolerance Patient tolerated treatment well    Behavior During Therapy [facility] for tasks assessed/performed             Past Medical History:  Diagnosis Date   Anxiety    Cancer (HCC) 2023   breast   COVID-19    2021   Cyst 01/2011   2016 stable. near rectum that gives pt discomfort when sitting and has grown in size in the past year    History of hiatal hernia    Migraines    Past Surgical History:  Procedure Laterality Date   BREAST CYST EXCISION Left 04/06/2022   Procedure: EXCISION OF LEFT NIPPLE;  Surgeon: Emelia Loron, MD;  Location: [facility] OR;  Service: General;  Laterality: Left;   BREAST LUMPECTOMY Left 04/14/2022   Procedure: LEFT BREAST SKIN EXCISION;  Surgeon: Emelia Loron, MD;  Location: [facility] ORS;  Service: General;  Laterality: Left;   BREAST RECONSTRUCTION WITH PLACEMENT OF TISSUE EXPANDER AND ALLODERM Left 01/31/2022   Procedure: BREAST RECONSTRUCTION WITH PLACEMENT OF TISSUE EXPANDER AND ALLODERM;  Surgeon: Glenna Fellows, MD;  Location: [facility] OR;  Service: Plastics;  Laterality: Left;   CERVICAL POLYPECTOMY  02/21/2021   uterus   CESAREAN SECTION  03/18/1999   FOOT SURGERY Right 09/30/2002   bone in toe had to be shaved and realigned    LAPAROSCOPIC APPENDECTOMY N/A 10/08/2020   Procedure: APPENDECTOMY LAPAROSCOPIC;  Surgeon: Emelia Loron, MD;  Location: [facility] OR;  Service: General;  Laterality: N/A;   NIPPLE SPARING MASTECTOMY WITH SENTINEL LYMPH NODE BIOPSY Left 01/31/2022   Procedure: LEFT NIPPLE SPARING MASTECTOMY WITH AXILLARY SENTINEL LYMPH NODE BIOPSY;   Surgeon: Emelia Loron, MD;  Location: [facility] OR;  Service: General;  Laterality: Left;   Patient Active Problem List   Diagnosis Date Noted   S/P left mastectomy 01/31/2022   Malignant 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, estrogen receptor positive (HCC) 12/28/2021   S/P laparoscopic appendectomy 10/08/2020   History of Lyme disease 11/12/2018   Urge incontinence 11/12/2017   Overweight 05/07/2017   Allergic rhinitis 05/07/2017   Hyperlipidemia 10/31/2016   TMJ disease 03/17/2015   Anal benign neoplasm 02/14/2011   ACTINIC KERATOSIS, CHEEK, LEFT 11/13/2007   Migraine headache 10/30/2006     REFERRING PROVIDER: Emelia Loron MD  REFERRING DIAG: Left Breast Cancer, Decreased  left shoulder ROM, Right LBP  THERAPY DIAG:  Aftercare following surgery for neoplasm  Malignant 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, estrogen receptor positive (HCC)  Abnormal posture  Stiffness of left shoulder, not elsewhere classified  ONSET DATE: 2 months ago  Rationale for Evaluation and Treatment: Rehabilitation  SUBJECTIVE:  SUBJECTIVE STATEMENT:  .My eft lateral trunk felt better after last visit, but my right LB tightened up again after I was here.  PERTINENT HISTORY:  Patient was diagnosed on 12/19/2021 with left grade 2 invasive ductal carcinoma breast cancer. She underwent a left mastectomy with immediate reconstruction with expanders placed and a sentinel node biopsy (1/1 node positive) on 01/31/2022.  She had a left nipple resection on 04/14/2022.It is ER/PR positive and HER2 negative with a Ki67 of 10%. Radiation ended 07/07/2022  PAIN:  Are you having pain? No, more discomfort today, tender, tight in right LB Pain location: Left chest,axilla, lateral trunk,  Right LB Pain orientation: Right and Left  PAIN TYPE: tight and tender, back feels like a cramp/spasm Pain description: intermittent  Aggravating factors: standing, doing dishes (for right LB), chest with movement of left UE. Relieving factors: shower/heat, motrin,  PRECAUTIONS: Left UE Lymphedema Risk  RED FLAGS: None   WEIGHT BEARING RESTRICTIONS: No  FALLS:  Has patient fallen in last 6 months? No  LIVING ENVIRONMENT: Lives with: lives with their spouse Lives in: House/apartment Has following equipment at home: None  OCCUPATION: not presently working  LEISURE: ride stationary bike,walking,music   HAND DOMINANCE: right   PRIOR LEVEL OF FUNCTION: Independent  PATIENT GOALS: Return to feeling more comfortable and loose, Better ROM   OBJECTIVE:  COGNITION: Overall cognitive status: Within functional limits for tasks assessed   PALPATION: Tight tender left pectorals, UT, lateral trunk, Right lumbar paraspinals  OBSERVATIONS / OTHER ASSESSMENTS: slumped sitting posture  SENSATION: Light touch: Deficits    POSTURE: forward head ,rounded shoulders  UPPER EXTREMITY AROM/PROM:  A/PROM RIGHT   eval   Shoulder extension 60  Shoulder flexion 165  Shoulder abduction 150  Shoulder internal rotation 63  Shoulder external rotation 104    (Blank rows = not tested)  A/PROM LEFT   eval  Shoulder extension 50 tight  Shoulder flexion 132  Shoulder abduction 118  Shoulder internal rotation 70  Shoulder external rotation 90    (Blank rows = not tested)  CERVICAL AROM: All within functional limits:   LUMBAR ROM: FB felt tight, left SB felt tight on right, Right SB -  UPPER EXTREMITY STRENGTH:   LYMPHEDEMA ASSESSMENTS:   SURGERY TYPE/DATE: Left Mastectomy with Reconstruction 01/31/2022, Left Nipple Reconstruction 04/14/22  NUMBER OF LYMPH NODES REMOVED: 1/1  CHEMOTHERAPY: NO  RADIATION:to axilla only  HORMONE TREATMENT: Tamoxifen  INFECTIONS:  NO   LYMPHEDEMA ASSESSMENTS:   LANDMARK RIGHT  eval  At axilla    15 cm proximal to olecranon process   10 cm proximal to olecranon process   Olecranon process   15 cm proximal to ulnar styloid process   10 cm proximal to ulnar styloid process   Just proximal to ulnar styloid process   Across hand at thumb web space   At base of 2nd digit   (Blank rows = not tested)  LANDMARK LEFT  eval  At axilla    15 cm proximal to olecranon process   10 cm proximal to olecranon process   Olecranon process   15 cm proximal to ulnar styloid process   10 cm proximal to ulnar styloid process   Just proximal to ulnar styloid process   Across hand at thumb web space   At base of 2nd digit   (Blank rows = not tested)   FUNCTIONAL TESTS:    GAIT:: WNL  QUICK DASH SURVEY: 20%   TODAY'S TREATMENT:  DATE: 12/20/2022  Therapeutic Exercises Pulleys flexion and abduction x 2 min, ball rolls x 10 flexion Right SL with left shoulder abduction x 5 Supine on half foam roll;Bilateral shoulder flexion, scaption, horizontal abd, snow angel x 5 MANUAL STM left UT/pectorals/lateral trunk and in SL to left UT scapular area with cocoa butter, and to right LB in Right SL PROM left shoulder flexion,scaption, abd, IR and ER Discussed rolling on a tennis ball on the wall for her LBP  12/19/2022 Therapeutic Exercises Pulleys flexion and abduction x 2:30, ball rolls x 10 flexion Right SL with left shoulder abduction x 5 Supine on half foam roll;Bilateral shoulder flexion, scaption, horizontal abd x 5 MANUAL STM left UT/pectorals/lateral trunk and in SL to left UT scapular area with cocoa butter, and to right LB in left SL. PROM left shoulder flexion,scaption, abd, IR and ER   12/06/2022 Reviewed Supine wand flexion and scaption x 5, stargazer x 5, LTR x 5,  Updated  HEP. Discussed POC;will schedule before expander exchange on 12/26/2022 and a week after depending on when MD releases her.    PATIENT EDUCATION:  Access Code: [email or web address] URL: [email or web address] Date: 12/06/2022 Prepared by: Alvira Monday  Exercises - Single Arm Doorway Pec Stretch at 90 Degrees Abduction  - 2 x daily - 7 x weekly - 1 sets - 3 reps - 20-30 hold - Standing Shoulder and Trunk Flexion at Table  - 2 x daily - 7 x weekly - 1 sets - 3 reps - 20-30 hold - Supine Shoulder Flexion with Dowel  - 2 x daily - 7 x weekly - 5 reps - 10 hold - Supine Chest Stretch with Elbows Bent  - 2 x daily - 7 x weekly - 1 sets - 5 reps - 10 hold - Supine Lower Trunk Rotation  - 2 x daily - 7 x weekly - 1 sets - 3 reps - 10 hold Education details: see above Person educated: Patient Education method: Explanation, Demonstration, and Handouts Education comprehension: verbalized understanding and returned demonstration  HOME EXERCISE PROGRAM: Single Arm Doorway Pec Stretch at 90 Degrees Abduction  - 2 x daily - 7 x weekly - 1 sets - 3 reps - 20-30 hold - Standing Shoulder and Trunk Flexion at Table  - 2 x daily - 7 x weekly - 1 sets - 3 reps - 20-30 hold - Supine Shoulder Flexion with Dowel  - 2 x daily - 7 x weekly - 5 reps - 10 hold - Supine Chest Stretch with Elbows Bent  - 2 x daily - 7 x weekly - 1 sets - 5 reps - 10 hold - Supine Lower Trunk Rotation  - 2 x daily - 7 x weekly - 1 sets - 3 reps - 10 hold  ASSESSMENT:  CLINICAL IMPRESSION:  Pt continues with tightness/tenderness especially in left pectorals and serratus and lower intercostals and right LB. Exercises on the foam roll feel especially good to her LB  OBJECTIVE IMPAIRMENTS: decreased knowledge of condition, decreased ROM, increased muscle spasms, impaired UE functional use, and pain.   ACTIVITY LIMITATIONS: standing, sleeping, and reach over head  PARTICIPATION LIMITATIONS:  able to participate in most activities  but limited with reaching,standing  PERSONAL FACTORS: 1-2 comorbidities: Left breast cancer s/p Mastectomy with tissue expander, radiation  are also affecting patient's functional outcome.   REHAB POTENTIAL: Excellent  CLINICAL DECISION MAKING: Stable/uncomplicated  EVALUATION COMPLEXITY: Low  GOALS: Goals reviewed with patient? Yes  SHORT TERM GOALS: Target date: 01/03/2023  Pt will be independent and compliant with initial HEP to improve shoulder ROM Baseline: Goal status: INITIAL  2.  Pt will have decreased pain/tightness by 25% Baseline:  Goal status: INITIAL  3.  Pt will have improved shoulder flexion and abduction by atleast 15 degrees Baseline:  Goal status: INITIAL   LONG TERM GOALS: Target date: 01/31/2023  Pts quick dash will be improved to no greater than 10% Baseline:  Goal status: INITIAL  2.  Pt will have left shoulder flexion 160 degrees and abd 145 for improved reaching ability Baseline:  Goal status: INITIAL  3.  Pt will be able to lie comfortably on her left side Baseline:  Goal status: INITIAL  4.  Pt will be able to stand longer/do dishes with min complaint of right LBP Baseline:  Goal status: INITIAL   PLAN:  PT FREQUENCY: 2x/week  PT DURATION: 8 weeks  PLANNED INTERVENTIONS: Therapeutic exercises, Neuromuscular re-education, Patient/Family education, Self Care, Dry Needling, Manual therapy, and Re-evaluation  PLAN FOR NEXT SESSION: pulleys, wall slides, ball rolls on wall, STM Left pectorals, UT, Lat. Trunk, Right LB, Postural education,PROM left shoulder, progress to strength when ready  Waynette Buttery, PT 12/20/2022, 2:56 PM

## 2022-12-25 ENCOUNTER — Ambulatory Visit: Payer: 59

## 2022-12-25 DIAGNOSIS — Z483 Aftercare following surgery for neoplasm: Secondary | ICD-10-CM

## 2022-12-25 DIAGNOSIS — M25612 Stiffness of left shoulder, not elsewhere classified: Secondary | ICD-10-CM

## 2022-12-25 DIAGNOSIS — R293 Abnormal posture: Secondary | ICD-10-CM

## 2022-12-25 DIAGNOSIS — C50212 Malignant neoplasm of upper-inner quadrant of left female breast: Secondary | ICD-10-CM

## 2022-12-25 NOTE — Therapy (Signed)
OUTPATIENT PHYSICAL THERAPY  UPPER EXTREMITY ONCOLOGY EVALUATION  Patient Name: Samantha Pena MRN: [id number] DOB:1965-08-23, 57 y.o., female Today's Date: 12/25/2022  END OF SESSION:  PT End of Session - 12/25/22 1354     Visit Number 4    Number of Visits 16    Date for PT Re-Evaluation 01/31/23    PT Start Time 1400    PT Stop Time 1457    PT Time Calculation (min) 57 min    Activity Tolerance Patient tolerated treatment well    Behavior During Therapy [facility] for tasks assessed/performed             Past Medical History:  Diagnosis Date   Anxiety    Cancer (HCC) 2023   breast   COVID-19    2021   Cyst 01/2011   2016 stable. near rectum that gives pt discomfort when sitting and has grown in size in the past year    History of hiatal hernia    Migraines    Past Surgical History:  Procedure Laterality Date   BREAST CYST EXCISION Left 04/06/2022   Procedure: EXCISION OF LEFT NIPPLE;  Surgeon: Emelia Loron, MD;  Location: [facility] OR;  Service: General;  Laterality: Left;   BREAST LUMPECTOMY Left 04/14/2022   Procedure: LEFT BREAST SKIN EXCISION;  Surgeon: Emelia Loron, MD;  Location: [facility] ORS;  Service: General;  Laterality: Left;   BREAST RECONSTRUCTION WITH PLACEMENT OF TISSUE EXPANDER AND ALLODERM Left 01/31/2022   Procedure: BREAST RECONSTRUCTION WITH PLACEMENT OF TISSUE EXPANDER AND ALLODERM;  Surgeon: Glenna Fellows, MD;  Location: [facility] OR;  Service: Plastics;  Laterality: Left;   CERVICAL POLYPECTOMY  02/21/2021   uterus   CESAREAN SECTION  03/18/1999   FOOT SURGERY Right 09/30/2002   bone in toe had to be shaved and realigned    LAPAROSCOPIC APPENDECTOMY N/A 10/08/2020   Procedure: APPENDECTOMY LAPAROSCOPIC;  Surgeon: Emelia Loron, MD;  Location: [facility] OR;  Service: General;  Laterality: N/A;   NIPPLE SPARING MASTECTOMY WITH SENTINEL LYMPH NODE BIOPSY Left 01/31/2022   Procedure: LEFT NIPPLE SPARING MASTECTOMY WITH AXILLARY SENTINEL LYMPH NODE BIOPSY;   Surgeon: Emelia Loron, MD;  Location: [facility] OR;  Service: General;  Laterality: Left;   Patient Active Problem List   Diagnosis Date Noted   S/P left mastectomy 01/31/2022   Malignant 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, estrogen receptor positive (HCC) 12/28/2021   S/P laparoscopic appendectomy 10/08/2020   History of Lyme disease 11/12/2018   Urge incontinence 11/12/2017   Overweight 05/07/2017   Allergic rhinitis 05/07/2017   Hyperlipidemia 10/31/2016   TMJ disease 03/17/2015   Anal benign neoplasm 02/14/2011   ACTINIC KERATOSIS, CHEEK, LEFT 11/13/2007   Migraine headache 10/30/2006     REFERRING PROVIDER: Emelia Loron MD  REFERRING DIAG: Left Breast Cancer, Decreased  left shoulder ROM, Right LBP  THERAPY DIAG:  Aftercare following surgery for neoplasm  Malignant 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, estrogen receptor positive (HCC)  Abnormal posture  Stiffness of left shoulder, not elsewhere classified  ONSET DATE: 2 months ago  Rationale for Evaluation and Treatment: Rehabilitation  SUBJECTIVE:  SUBJECTIVE STATEMENT:  I did well after last visit. I woke up Saturday am with my right hip really hurting. I have never had that before. Its very tender at my lateral hip too. Its better today. It might be because I stopped the ibuprofen I usually take for my surgery tomorrow.  PERTINENT HISTORY:  Patient was diagnosed on 12/19/2021 with left grade 2 invasive ductal carcinoma breast cancer. She underwent a left mastectomy with immediate reconstruction with expanders placed and a sentinel node biopsy (1/1 node positive) on 01/31/2022.  She had a left nipple resection on 04/14/2022.It is ER/PR positive and HER2 negative with a Ki67 of 10%. Radiation  ended 07/07/2022  PAIN:  Are you having pain? No, more discomfort today, tender, tight in right LB and left chest, tender left lateral hip Pain location: Left chest,axilla, lateral trunk, Right LB Pain orientation: Right and Left  PAIN TYPE: tight and tender, hip and back Pain description: intermittent  Aggravating factors: standing, doing dishes (for right LB), chest with movement of left UE. Relieving factors: shower/heat, motrin,  PRECAUTIONS: Left UE Lymphedema Risk  RED FLAGS: None   WEIGHT BEARING RESTRICTIONS: No  FALLS:  Has patient fallen in last 6 months? No  LIVING ENVIRONMENT: Lives with: lives with their spouse Lives in: House/apartment Has following equipment at home: None  OCCUPATION: not presently working  LEISURE: ride stationary bike,walking,music   HAND DOMINANCE: right   PRIOR LEVEL OF FUNCTION: Independent  PATIENT GOALS: Return to feeling more comfortable and loose, Better ROM   OBJECTIVE:  COGNITION: Overall cognitive status: Within functional limits for tasks assessed   PALPATION: Tight tender left pectorals, UT, lateral trunk, Right lumbar paraspinals  OBSERVATIONS / OTHER ASSESSMENTS: slumped sitting posture  SENSATION: Light touch: Deficits    POSTURE: forward head ,rounded shoulders  UPPER EXTREMITY AROM/PROM:  A/PROM RIGHT   eval   Shoulder extension 60  Shoulder flexion 165  Shoulder abduction 150  Shoulder internal rotation 63  Shoulder external rotation 104    (Blank rows = not tested)  A/PROM LEFT   eval LEFT 12/25/2022  Shoulder extension 50 tight 55  Shoulder flexion 132 148  Shoulder abduction 118 148 tight  Shoulder internal rotation 70   Shoulder external rotation 90     (Blank rows = not tested)  CERVICAL AROM: All within functional limits:   LUMBAR ROM: FB felt tight, left SB felt tight on right, Right SB -  UPPER EXTREMITY STRENGTH:   LYMPHEDEMA ASSESSMENTS:   SURGERY TYPE/DATE: Left Mastectomy  with Reconstruction 01/31/2022, Left Nipple Reconstruction 04/14/22  NUMBER OF LYMPH NODES REMOVED: 1/1  CHEMOTHERAPY: NO  RADIATION:to axilla only  HORMONE TREATMENT: Tamoxifen  INFECTIONS: NO   LYMPHEDEMA ASSESSMENTS:   LANDMARK RIGHT  eval  At axilla    15 cm proximal to olecranon process   10 cm proximal to olecranon process   Olecranon process   15 cm proximal to ulnar styloid process   10 cm proximal to ulnar styloid process   Just proximal to ulnar styloid process   Across hand at thumb web space   At base of 2nd digit   (Blank rows = not tested)  LANDMARK LEFT  eval  At axilla    15 cm proximal to olecranon process   10 cm proximal to olecranon process   Olecranon process   15 cm proximal to ulnar styloid process   10 cm proximal to ulnar styloid process   Just proximal to ulnar styloid process  Across hand at thumb web space   At base of 2nd digit   (Blank rows = not tested)   FUNCTIONAL TESTS:    GAIT:: WNL  QUICK DASH SURVEY: 20%   TODAY'S TREATMENT:                                                                                                                                          DATE:  12/25/2022 Tender at right ITB and greater trochanter Pulleys x 2:30 ea flexion and abduction. Pt instructed in Right ITB stretch using strap x 3 ITB rolling with stick STM left UT/pectorals/lateral trunk and in SL to left UT scapular area with cocoa butter, and to right LB in Right SL PROM left shoulder flexion,scaption, abd, IR and ER Supine on half foam roll;Bilateral shoulder flexion, scaption, horizontal abd, snow angel x 5 Measured AROM left shoulder 12/20/2022  Therapeutic Exercises Pulleys flexion and abduction x 2 min, ball rolls x 10 flexion Right SL with left shoulder abduction x 5 Supine on half foam roll;Bilateral shoulder flexion, scaption, horizontal abd, snow angel x 5 MANUAL STM left UT/pectorals/lateral trunk and in SL to left UT  scapular area with cocoa butter, and to right LB in Right SL PROM left shoulder flexion,scaption, abd, IR and ER Discussed rolling on a tennis ball on the wall for her LBP  12/19/2022 Therapeutic Exercises Pulleys flexion and abduction x 2:30, ball rolls x 10 flexion Right SL with left shoulder abduction x 5 Supine on half foam roll;Bilateral shoulder flexion, scaption, horizontal abd x 5 MANUAL STM left UT/pectorals/lateral trunk and in SL to left UT scapular area with cocoa butter, and to right LB in left SL. PROM left shoulder flexion,scaption, abd, IR and ER   12/06/2022 Reviewed Supine wand flexion and scaption x 5, stargazer x 5, LTR x 5,  Updated HEP. Discussed POC;will schedule before expander exchange on 12/26/2022 and a week after depending on when MD releases her.    PATIENT EDUCATION:  Access Code: [email or web address] URL: [email or web address] Date: 12/25/2022 Prepared by: Alvira Monday  Exercises - Supine ITB Stretch with Strap  - 2 x daily - 7 x weekly - 1 sets - 3 reps - 20-30 hold Access Code: [email or web address] URL: [email or web address] Date: 12/06/2022 Prepared by: Alvira Monday  Exercises - Single Arm Doorway Pec Stretch at 90 Degrees Abduction  - 2 x daily - 7 x weekly - 1 sets - 3 reps - 20-30 hold - Standing Shoulder and Trunk Flexion at Table  - 2 x daily - 7 x weekly - 1 sets - 3 reps - 20-30 hold - Supine Shoulder Flexion with Dowel  - 2 x daily - 7 x weekly - 5 reps - 10 hold - Supine Chest Stretch with Elbows Bent  - 2 x daily - 7 x weekly - 1 sets - 5 reps - 10 hold - Supine Lower Trunk  Rotation  - 2 x daily - 7 x weekly - 1 sets - 3 reps - 10 hold Education details: see above Person educated: Patient Education method: Explanation, Demonstration, and Handouts Education comprehension: verbalized understanding and returned demonstration  HOME EXERCISE PROGRAM: Single Arm Doorway Pec Stretch at 90 Degrees Abduction  - 2 x daily - 7 x weekly -  1 sets - 3 reps - 20-30 hold - Standing Shoulder and Trunk Flexion at Table  - 2 x daily - 7 x weekly - 1 sets - 3 reps - 20-30 hold - Supine Shoulder Flexion with Dowel  - 2 x daily - 7 x weekly - 5 reps - 10 hold - Supine Chest Stretch with Elbows Bent  - 2 x daily - 7 x weekly - 1 sets - 5 reps - 10 hold - Supine Lower Trunk Rotation  - 2 x daily - 7 x weekly - 1 sets - 3 reps - 10 hold  ASSESSMENT:  CLINICAL IMPRESSION:  Pt continues with tightness/tenderness especially in left pectorals and serratus and lower intercostals and right LB. Pt has a new onset of right hip tenderness at Greater trochanter, possibly due to stopping her ibuprofen for her expander exchange surgery tomorrow. Good increase in left shoulder AROM  OBJECTIVE IMPAIRMENTS: decreased knowledge of condition, decreased ROM, increased muscle spasms, impaired UE functional use, and pain.   ACTIVITY LIMITATIONS: standing, sleeping, and reach over head  PARTICIPATION LIMITATIONS:  able to participate in most activities but limited with reaching,standing  PERSONAL FACTORS: 1-2 comorbidities: Left breast cancer s/p Mastectomy with tissue expander, radiation  are also affecting patient's functional outcome.   REHAB POTENTIAL: Excellent  CLINICAL DECISION MAKING: Stable/uncomplicated  EVALUATION COMPLEXITY: Low  GOALS: Goals reviewed with patient? Yes  SHORT TERM GOALS: Target date: 01/03/2023  Pt will be independent and compliant with initial HEP to improve shoulder ROM Baseline: Goal status: MET 12/25/2022  2.  Pt will have decreased pain/tightness by 25% Baseline:  Goal status: INITIAL  3.  Pt will have improved shoulder flexion and abduction by atleast 15 degrees Baseline:  Goal status: INITIAL   LONG TERM GOALS: Target date: 01/31/2023  Pts quick dash will be improved to no greater than 10% Baseline:  Goal status: INITIAL  2.  Pt will have left shoulder flexion 160 degrees and abd 145 for improved  reaching ability Baseline:  Goal status: INITIAL  3.  Pt will be able to lie comfortably on her left side Baseline:  Goal status: INITIAL  4.  Pt will be able to stand longer/do dishes with min complaint of right LBP Baseline:  Goal status: INITIAL   PLAN:  PT FREQUENCY: 2x/week  PT DURATION: 8 weeks  PLANNED INTERVENTIONS: Therapeutic exercises, Neuromuscular re-education, Patient/Family education, Self Care, Dry Needling, Manual therapy, and Re-evaluation  PLAN FOR NEXT SESSION: On hold until after expander exchange,pulleys, wall slides, ball rolls on wall, STM Left pectorals, UT, Lat. Trunk, Right LB, Postural education,PROM left shoulder, progress to strength when ready  Waynette Buttery, PT 12/25/2022, 3:00 PM

## 2022-12-26 ENCOUNTER — Other Ambulatory Visit: Payer: Self-pay

## 2022-12-26 ENCOUNTER — Ambulatory Visit (HOSPITAL_BASED_OUTPATIENT_CLINIC_OR_DEPARTMENT_OTHER)
Admission: RE | Admit: 2022-12-26 | Discharge: 2022-12-26 | Disposition: A | Discharge status: Home / Self Care | Payer: 59 | Attending: Plastic Surgery | Admitting: Plastic Surgery

## 2022-12-26 ENCOUNTER — Encounter (HOSPITAL_BASED_OUTPATIENT_CLINIC_OR_DEPARTMENT_OTHER)
Admission: RE | Disposition: A | Discharge status: Home / Self Care | Payer: Self-pay | Source: Home / Self Care | Attending: Plastic Surgery

## 2022-12-26 ENCOUNTER — Ambulatory Visit (HOSPITAL_BASED_OUTPATIENT_CLINIC_OR_DEPARTMENT_OTHER): Payer: 59 | Admitting: Anesthesiology

## 2022-12-26 ENCOUNTER — Encounter (HOSPITAL_BASED_OUTPATIENT_CLINIC_OR_DEPARTMENT_OTHER): Payer: Self-pay | Admitting: Plastic Surgery

## 2022-12-26 DIAGNOSIS — Z853 Personal history of malignant neoplasm of breast: Secondary | ICD-10-CM | POA: Diagnosis not present

## 2022-12-26 DIAGNOSIS — Z923 Personal history of irradiation: Secondary | ICD-10-CM | POA: Diagnosis not present

## 2022-12-26 DIAGNOSIS — Z170 Estrogen receptor positive status [ER+]: Secondary | ICD-10-CM | POA: Diagnosis not present

## 2022-12-26 DIAGNOSIS — Z01818 Encounter for other preprocedural examination: Secondary | ICD-10-CM

## 2022-12-26 DIAGNOSIS — K219 Gastro-esophageal reflux disease without esophagitis: Secondary | ICD-10-CM | POA: Insufficient documentation

## 2022-12-26 DIAGNOSIS — Z803 Family history of malignant neoplasm of breast: Secondary | ICD-10-CM | POA: Insufficient documentation

## 2022-12-26 DIAGNOSIS — Z9012 Acquired absence of left breast and nipple: Secondary | ICD-10-CM | POA: Diagnosis not present

## 2022-12-26 DIAGNOSIS — Z483 Aftercare following surgery for neoplasm: Secondary | ICD-10-CM | POA: Diagnosis not present

## 2022-12-26 DIAGNOSIS — Z421 Encounter for breast reconstruction following mastectomy: Secondary | ICD-10-CM | POA: Diagnosis present

## 2022-12-26 HISTORY — PX: BREAST ENHANCEMENT SURGERY: SHX7

## 2022-12-26 HISTORY — PX: REMOVAL OF BILATERAL TISSUE EXPANDERS WITH PLACEMENT OF BILATERAL BREAST IMPLANTS: SHX6431

## 2022-12-26 LAB — POCT PREGNANCY, URINE: Preg Test, Ur: NEGATIVE

## 2022-12-26 SURGERY — REMOVAL, TISSUE EXPANDER, BREAST, BILATERAL, WITH BILATERAL IMPLANT IMPLANT INSERTION
Anesthesia: General | Site: Chest | Laterality: Right

## 2022-12-26 MED ORDER — ACETAMINOPHEN 500 MG PO TABS
1000.0000 mg | ORAL_TABLET | ORAL | Status: AC
  Administered 2022-12-26: 1000 mg via ORAL

## 2022-12-26 MED ORDER — CEFAZOLIN SODIUM-DEXTROSE 2-4 GM/100ML-% IV SOLN
INTRAVENOUS | Status: AC
  Filled 2022-12-26: qty 100

## 2022-12-26 MED ORDER — CELECOXIB 200 MG PO CAPS
200.0000 mg | ORAL_CAPSULE | ORAL | Status: AC
  Administered 2022-12-26: 200 mg via ORAL

## 2022-12-26 MED ORDER — LIDOCAINE HCL (CARDIAC) PF 100 MG/5ML IV SOSY
PREFILLED_SYRINGE | INTRAVENOUS | Status: DC | PRN
  Administered 2022-12-26: 60 mg via INTRAVENOUS

## 2022-12-26 MED ORDER — SUGAMMADEX SODIUM 200 MG/2ML IV SOLN
INTRAVENOUS | Status: DC | PRN
Start: 2022-12-26 — End: 2022-12-26
  Administered 2022-12-26: 150 mg via INTRAVENOUS

## 2022-12-26 MED ORDER — GABAPENTIN 300 MG PO CAPS
300.0000 mg | ORAL_CAPSULE | ORAL | Status: AC
  Administered 2022-12-26: 300 mg via ORAL

## 2022-12-26 MED ORDER — FENTANYL CITRATE (PF) 100 MCG/2ML IJ SOLN
INTRAMUSCULAR | Status: DC | PRN
  Administered 2022-12-26: 100 ug via INTRAVENOUS

## 2022-12-26 MED ORDER — SODIUM CHLORIDE 0.9 % IV SOLN
INTRAVENOUS | Status: AC
  Filled 2022-12-26: qty 10

## 2022-12-26 MED ORDER — ACETAMINOPHEN 500 MG PO TABS
ORAL_TABLET | ORAL | Status: AC
  Filled 2022-12-26: qty 2

## 2022-12-26 MED ORDER — DEXAMETHASONE SODIUM PHOSPHATE 10 MG/ML IJ SOLN
INTRAMUSCULAR | Status: DC | PRN
Start: 2022-12-26 — End: 2022-12-26
  Administered 2022-12-26: 10 mg via INTRAVENOUS

## 2022-12-26 MED ORDER — ROCURONIUM BROMIDE 100 MG/10ML IV SOLN
INTRAVENOUS | Status: DC | PRN
  Administered 2022-12-26: 70 mg via INTRAVENOUS

## 2022-12-26 MED ORDER — ONDANSETRON HCL 4 MG/2ML IJ SOLN
INTRAMUSCULAR | Status: AC
  Filled 2022-12-26: qty 2

## 2022-12-26 MED ORDER — EPHEDRINE 5 MG/ML INJ
INTRAVENOUS | Status: AC
  Filled 2022-12-26: qty 5

## 2022-12-26 MED ORDER — MIDAZOLAM HCL 5 MG/5ML IJ SOLN
INTRAMUSCULAR | Status: DC | PRN
  Administered 2022-12-26: 2 mg via INTRAVENOUS

## 2022-12-26 MED ORDER — CEFAZOLIN SODIUM-DEXTROSE 2-4 GM/100ML-% IV SOLN
2.0000 g | INTRAVENOUS | Status: AC
  Administered 2022-12-26: 2 g via INTRAVENOUS

## 2022-12-26 MED ORDER — MEPERIDINE HCL 25 MG/ML IJ SOLN: 6.2500 mg | INTRAMUSCULAR | Status: DC | PRN

## 2022-12-26 MED ORDER — CELECOXIB 200 MG PO CAPS
ORAL_CAPSULE | ORAL | Status: AC
  Filled 2022-12-26: qty 1

## 2022-12-26 MED ORDER — MIDAZOLAM HCL 2 MG/2ML IJ SOLN
INTRAMUSCULAR | Status: AC
  Filled 2022-12-26: qty 2

## 2022-12-26 MED ORDER — OXYCODONE HCL 5 MG PO TABS: 5.0000 mg | ORAL_TABLET | Freq: Once | ORAL | Status: DC | PRN

## 2022-12-26 MED ORDER — BUPIVACAINE HCL (PF) 0.5 % IJ SOLN
INTRAMUSCULAR | Status: DC | PRN
  Administered 2022-12-26: 30 mL

## 2022-12-26 MED ORDER — LACTATED RINGERS IV SOLN: INTRAVENOUS | Status: DC

## 2022-12-26 MED ORDER — GABAPENTIN 300 MG PO CAPS
ORAL_CAPSULE | ORAL | Status: AC
  Filled 2022-12-26: qty 1

## 2022-12-26 MED ORDER — PROMETHAZINE HCL 25 MG/ML IJ SOLN: 6.2500 mg | INTRAMUSCULAR | Status: DC | PRN

## 2022-12-26 MED ORDER — POVIDONE-IODINE 10 % EX SOLN
CUTANEOUS | Status: DC | PRN
  Administered 2022-12-26: 1 via TOPICAL

## 2022-12-26 MED ORDER — HYDROMORPHONE HCL 1 MG/ML IJ SOLN: 0.2500 mg | INTRAMUSCULAR | Status: DC | PRN

## 2022-12-26 MED ORDER — ROCURONIUM BROMIDE 10 MG/ML (PF) SYRINGE
PREFILLED_SYRINGE | INTRAVENOUS | Status: AC
  Filled 2022-12-26: qty 10

## 2022-12-26 MED ORDER — LIDOCAINE 2% (20 MG/ML) 5 ML SYRINGE
INTRAMUSCULAR | Status: AC
  Filled 2022-12-26: qty 5

## 2022-12-26 MED ORDER — OXYCODONE HCL 5 MG/5ML PO SOLN: 5.0000 mg | Freq: Once | ORAL | Status: DC | PRN

## 2022-12-26 MED ORDER — AMISULPRIDE (ANTIEMETIC) 5 MG/2ML IV SOLN: 10.0000 mg | Freq: Once | INTRAVENOUS | Status: DC | PRN

## 2022-12-26 MED ORDER — PROPOFOL 500 MG/50ML IV EMUL
INTRAVENOUS | Status: DC | PRN
  Administered 2022-12-26: 50 ug/kg/min via INTRAVENOUS

## 2022-12-26 MED ORDER — FENTANYL CITRATE (PF) 100 MCG/2ML IJ SOLN
INTRAMUSCULAR | Status: AC
  Filled 2022-12-26: qty 2

## 2022-12-26 MED ORDER — EPHEDRINE SULFATE (PRESSORS) 50 MG/ML IJ SOLN
INTRAMUSCULAR | Status: DC | PRN
  Administered 2022-12-26: 10 mg via INTRAVENOUS
  Administered 2022-12-26 (×2): 5 mg via INTRAVENOUS

## 2022-12-26 MED ORDER — SODIUM CHLORIDE 0.9 % IV SOLN
INTRAVENOUS | Status: DC | PRN
  Administered 2022-12-26: 500 mL

## 2022-12-26 MED ORDER — ONDANSETRON HCL 4 MG/2ML IJ SOLN
INTRAMUSCULAR | Status: DC | PRN
  Administered 2022-12-26: 4 mg via INTRAVENOUS

## 2022-12-26 MED ORDER — PROPOFOL 10 MG/ML IV BOLUS
INTRAVENOUS | Status: DC | PRN
  Administered 2022-12-26: 140 mg via INTRAVENOUS

## 2022-12-26 SURGICAL SUPPLY — 102 items
ADH SKN CLS APL DERMABOND .7 (GAUZE/BANDAGES/DRESSINGS) ×6
APL PRP STRL LF DISP 70% ISPRP (MISCELLANEOUS) ×6
BAG DECANTER FOR FLEXI CONT (MISCELLANEOUS) ×3 IMPLANT
BINDER ABDOMINAL 10 UNV 27-48 (MISCELLANEOUS) IMPLANT
BINDER ABDOMINAL 12 SM 30-45 (SOFTGOODS) IMPLANT
BINDER BREAST 3XL (GAUZE/BANDAGES/DRESSINGS) IMPLANT
BINDER BREAST LRG (GAUZE/BANDAGES/DRESSINGS) ×1 IMPLANT
BINDER BREAST MEDIUM (GAUZE/BANDAGES/DRESSINGS) IMPLANT
BINDER BREAST XLRG (GAUZE/BANDAGES/DRESSINGS) IMPLANT
BINDER BREAST XXLRG (GAUZE/BANDAGES/DRESSINGS) IMPLANT
BLADE SURG 10 STRL SS (BLADE) ×6 IMPLANT
BLADE SURG 11 STRL SS (BLADE) ×2 IMPLANT
BLADE SURG 15 STRL LF DISP TIS (BLADE) ×2 IMPLANT
BLADE SURG 15 STRL SS (BLADE)
BNDG CMPR 6 X 5 YARDS HK CLSR (GAUZE/BANDAGES/DRESSINGS)
BNDG ELASTIC 6INX 5YD STR LF (GAUZE/BANDAGES/DRESSINGS) IMPLANT
BNDG GAUZE DERMACEA FLUFF 4 (GAUZE/BANDAGES/DRESSINGS) ×6 IMPLANT
BNDG GZE DERMACEA 4 6PLY (GAUZE/BANDAGES/DRESSINGS) ×6
CANISTER LIPO FAT HARVEST (MISCELLANEOUS) ×2 IMPLANT
CANISTER SUCT 1200ML W/VALVE (MISCELLANEOUS) ×3 IMPLANT
CHLORAPREP W/TINT 26 (MISCELLANEOUS) ×6 IMPLANT
COVER BACK TABLE 60X90IN (DRAPES) ×3 IMPLANT
COVER MAYO STAND STRL (DRAPES) ×5 IMPLANT
DERMABOND ADVANCED .7 DNX12 (GAUZE/BANDAGES/DRESSINGS) ×6 IMPLANT
DRAIN CHANNEL 15F RND FF W/TCR (WOUND CARE) IMPLANT
DRAIN RELI 100 BL SUC LF ST (DRAIN)
DRAPE TOP ARMCOVERS (MISCELLANEOUS) ×3 IMPLANT
DRAPE U-SHAPE 76X120 STRL (DRAPES) ×3 IMPLANT
DRAPE UTILITY XL STRL (DRAPES) ×3 IMPLANT
DRSG TEGADERM 2-3/8X2-3/4 SM (GAUZE/BANDAGES/DRESSINGS) IMPLANT
ELECT BLADE 4.0 EZ CLEAN MEGAD (MISCELLANEOUS) ×3
ELECT BLADE 6.5 EXT (BLADE) IMPLANT
ELECT COATED BLADE 2.86 ST (ELECTRODE) ×3 IMPLANT
ELECT REM PT RETURN 9FT ADLT (ELECTROSURGICAL) ×3
ELECTRODE BLDE 4.0 EZ CLN MEGD (MISCELLANEOUS) ×2 IMPLANT
ELECTRODE REM PT RTRN 9FT ADLT (ELECTROSURGICAL) ×2 IMPLANT
EVACUATOR SILICONE 100CC (DRAIN) IMPLANT
GAUZE PAD ABD 8X10 STRL (GAUZE/BANDAGES/DRESSINGS) ×6 IMPLANT
GLOVE BIO SURGEON STRL SZ 6 (GLOVE) ×6 IMPLANT
GLOVE BIO SURGEON STRL SZ8 (GLOVE) ×1 IMPLANT
GLOVE BIOGEL PI IND STRL 7.0 (GLOVE) ×1 IMPLANT
GLOVE BIOGEL PI IND STRL 8 (GLOVE) ×1 IMPLANT
GOWN STRL REUS W/ TWL LRG LVL3 (GOWN DISPOSABLE) ×5 IMPLANT
GOWN STRL REUS W/ TWL XL LVL3 (GOWN DISPOSABLE) ×1 IMPLANT
GOWN STRL REUS W/TWL LRG LVL3 (GOWN DISPOSABLE) ×3
GOWN STRL REUS W/TWL XL LVL3 (GOWN DISPOSABLE) ×3
IMPL BREAST P2.8-3.3XLO 175 (Breast) IMPLANT
IMPL BRST P2.8-3.3XLO 175CC (Breast) ×3 IMPLANT
IMPL SALINE SMOOTH 350CC (Breast) IMPLANT
IMPLANT BREAST SALINE 175CC (Breast) ×3 IMPLANT
IMPLANT SALINE SMOOTH 350CC (Breast) ×3 IMPLANT
IV NS 1000ML (IV SOLUTION) ×3
IV NS 1000ML BAXH (IV SOLUTION) ×1 IMPLANT
IV NS 500ML (IV SOLUTION)
IV NS 500ML BAXH (IV SOLUTION) ×2 IMPLANT
KIT FILL ASEPTIC TRANSFER (MISCELLANEOUS) IMPLANT
LINER CANISTER 1000CC FLEX (MISCELLANEOUS) ×2 IMPLANT
MARKER SKIN DUAL TIP RULER LAB (MISCELLANEOUS) ×1 IMPLANT
NDL FILTER BLUNT 18X1 1/2 (NEEDLE) IMPLANT
NDL HYPO 25X1 1.5 SAFETY (NEEDLE) ×2 IMPLANT
NDL SAFETY ECLIP 18X1.5 (MISCELLANEOUS) ×2 IMPLANT
NEEDLE FILTER BLUNT 18X1 1/2 (NEEDLE)
NEEDLE HYPO 25X1 1.5 SAFETY (NEEDLE)
NS IRRIG 1000ML POUR BTL (IV SOLUTION) ×3 IMPLANT
PACK BASIN DAY SURGERY FS (CUSTOM PROCEDURE TRAY) ×3 IMPLANT
PAD ALCOHOL SWAB (MISCELLANEOUS) ×2 IMPLANT
PENCIL SMOKE EVACUATOR (MISCELLANEOUS) ×3 IMPLANT
PIN SAFETY STERILE (MISCELLANEOUS) IMPLANT
SHEET MEDIUM DRAPE 40X70 STRL (DRAPES) ×5 IMPLANT
SIZER BREAST SGL USE 175-195CC (SIZER) ×3
SIZER BREAST SGL USE 350-380CC (SIZER) ×3
SIZER BRST SGL USE 175-195CC (SIZER) IMPLANT
SIZER BRST SGL USE 350-380CC (SIZER) IMPLANT
SLEEVE SCD COMPRESS KNEE MED (STOCKING) ×3 IMPLANT
SPIKE FLUID TRANSFER (MISCELLANEOUS) ×2 IMPLANT
SPONGE T-LAP 18X18 ~~LOC~~+RFID (SPONGE) ×3 IMPLANT
STAPLER VISISTAT 35W (STAPLE) ×4 IMPLANT
STRIP CLOSURE SKIN 1/2X4 (GAUZE/BANDAGES/DRESSINGS) ×2 IMPLANT
SUT ETHILON 2 0 FS 18 (SUTURE) IMPLANT
SUT MNCRL AB 4-0 PS2 18 (SUTURE) ×3 IMPLANT
SUT PDS AB 2-0 CT2 27 (SUTURE) ×1 IMPLANT
SUT VIC AB 3-0 PS1 18 (SUTURE)
SUT VIC AB 3-0 PS1 18XBRD (SUTURE) IMPLANT
SUT VIC AB 3-0 SH 27 (SUTURE) ×3
SUT VIC AB 3-0 SH 27X BRD (SUTURE) ×3 IMPLANT
SUT VIC AB 4-0 PS2 18 (SUTURE) ×3 IMPLANT
SUT VICRYL 0 CT-2 (SUTURE) IMPLANT
SUT VLOC 180 0 24IN GS25 (SUTURE) IMPLANT
SYR 10ML LL (SYRINGE) ×8 IMPLANT
SYR 20ML LL LF (SYRINGE) IMPLANT
SYR 50ML LL SCALE MARK (SYRINGE) ×2 IMPLANT
SYR BULB IRRIG 60ML STRL (SYRINGE) ×5 IMPLANT
SYR CONTROL 10ML LL (SYRINGE) ×3 IMPLANT
SYR TB 1ML LL NO SAFETY (SYRINGE) ×2 IMPLANT
SYR TOOMEY IRRIG 70ML (MISCELLANEOUS)
SYRINGE TOOMEY IRRIG 70ML (MISCELLANEOUS) IMPLANT
TOWEL GREEN STERILE FF (TOWEL DISPOSABLE) ×6 IMPLANT
TUBE CONNECTING 20X1/4 (TUBING) ×3 IMPLANT
TUBING INFILTRATION IT-10001 (TUBING) ×2 IMPLANT
TUBING SET GRADUATE ASPIR 12FT (MISCELLANEOUS) ×2 IMPLANT
UNDERPAD 30X36 HEAVY ABSORB (UNDERPADS AND DIAPERS) ×6 IMPLANT
YANKAUER SUCT BULB TIP NO VENT (SUCTIONS) ×3 IMPLANT

## 2022-12-26 NOTE — Anesthesia Preprocedure Evaluation (Addendum)
Anesthesia Evaluation  Patient identified by MRN, date of birth, ID band Patient awake    Reviewed: Allergy & Precautions, NPO status , Patient's Chart, lab work & pertinent test results  History of Anesthesia Complications Negative for: history of anesthetic complications  Airway Mallampati: I  TM Distance: >3 FB Neck ROM: Full    Dental  (+) Dental Advisory Given, Teeth Intact   Pulmonary neg pulmonary ROS   breath sounds clear to auscultation       Cardiovascular negative cardio ROS  Rhythm:Regular Rate:Normal     Neuro/Psych  Headaches (migraine last night)  Anxiety        GI/Hepatic Neg liver ROS,GERD  Controlled,,  Endo/Other  negative endocrine ROS    Renal/GU negative Renal ROS     Musculoskeletal   Abdominal   Peds  Hematology negative hematology ROS (+)   Anesthesia Other Findings Breast cancer  Reproductive/Obstetrics                             Anesthesia Physical Anesthesia Plan  ASA: 2  Anesthesia Plan: General   Post-op Pain Management: Tylenol PO (pre-op)*, Gabapentin PO (pre-op)* and Celebrex PO (pre-op)*   Induction: Intravenous  PONV Risk Score and Plan: 3 and Ondansetron, Dexamethasone, Midazolam and Treatment may vary due to age or medical condition  Airway Management Planned: Oral ETT  Additional Equipment: None  Intra-op Plan:   Post-operative Plan: Extubation in OR  Informed Consent: I have reviewed the patients History and Physical, chart, labs and discussed the procedure including the risks, benefits and alternatives for the proposed anesthesia with the patient or authorized representative who has indicated his/her understanding and acceptance.     Dental advisory given  Plan Discussed with: CRNA and Surgeon  Anesthesia Plan Comments:         Anesthesia Quick Evaluation

## 2022-12-26 NOTE — Discharge Instructions (Addendum)
  Post Anesthesia Home Care Instructions  Activity: Get plenty of rest for the remainder of the day. A responsible individual must stay with you for 24 hours following the procedure.  For the next 24 hours, DO NOT: -Drive a car -Advertising copywriter -Drink alcoholic beverages -Take any medication unless instructed by your physician -Make any legal decisions or sign important papers.  Meals: Start with liquid foods such as gelatin or soup. Progress to regular foods as tolerated. Avoid greasy, spicy, heavy foods. If nausea and/or vomiting occur, drink only clear liquids until the nausea and/or vomiting subsides. Call your physician if vomiting continues.  Special Instructions/Symptoms: Your throat may feel dry or sore from the anesthesia or the breathing tube placed in your throat during surgery. If this causes discomfort, gargle with warm salt water. The discomfort should disappear within 24 hours.  If you had a scopolamine patch placed behind your ear for the management of post- operative nausea and/or vomiting:  1. The medication in the patch is effective for 72 hours, after which it should be removed.  Wrap patch in a tissue and discard in the trash. Wash hands thoroughly with soap and water. 2. You may remove the patch earlier than 72 hours if you experience unpleasant side effects which may include dry mouth, dizziness or visual disturbances. 3. Avoid touching the patch. Wash your hands with soap and water after contact with the patch.   Next dose of Tylenol may be given at 3:35pm if needed. Next dose of NSAID (Ibuprofen/Motrin/Aleve) may be given at 5:35pm if needed.

## 2022-12-26 NOTE — Anesthesia Procedure Notes (Signed)
Procedure Name: Intubation Date/Time: 12/26/2022 10:58 AM  Performed by: Thornell Mule, CRNAPre-anesthesia Checklist: Patient identified, Emergency Drugs available, Suction available and Patient being monitored Patient Re-evaluated:Patient Re-evaluated prior to induction Oxygen Delivery Method: Circle system utilized Preoxygenation: Pre-oxygenation with 100% oxygen Induction Type: IV induction Ventilation: Mask ventilation without difficulty Laryngoscope Size: Mac and 3 Grade View: Grade I Tube type: Oral Tube size: 7.0 mm Number of attempts: 1 Airway Equipment and Method: Stylet and Oral airway Placement Confirmation: ETT inserted through vocal cords under direct vision, positive ETCO2 and breath sounds checked- equal and bilateral Secured at: 20 cm Tube secured with: Tape Dental Injury: Teeth and Oropharynx as per pre-operative assessment

## 2022-12-26 NOTE — Op Note (Signed)
Operative Note   DATE OF OPERATION: 8.27.2024  LOCATION: [facility]-outpatient  SURGICAL DIVISION: Plastic Surgery  PREOPERATIVE DIAGNOSES:  1. History left breast cancer 2. Acquired absence left breast 3. History therapeutic radiation  POSTOPERATIVE DIAGNOSES:  same  PROCEDURE:  1. Removal left chest tissue expander and placement saline implant 2. Right breast augmentation with saline implant  SURGEON: Glenna Fellows MD MBA  ASSISTANT: none  ANESTHESIA:  General.   EBL: 25 ml  COMPLICATIONS: None immediate.   INDICATIONS FOR PROCEDURE:  The patient, Samantha Pena, is a 57 y.o. female born on February 02, 1966, is here for staged breast reconstruction following left mastectomy with prepectoral tissue expander based reconstruction.   FINDINGS: Complete incorporation ADM noted over left chest. Natrelle Smooth Round saline implant placed bilateral. RIGHT Low Profile 175 ml implant placed [id number] [id number], fill volume 180 ml [id number] LEFT High Profile 350 ml implant placed [id number] [id number] fill volume 360 ml [id number]  DESCRIPTION OF PROCEDURE:  The patient's operative site was marked with the patient in the preoperative area. The patient was taken to the operating room. SCDs were placed and IV antibiotics were given. The patient's operative site was prepped and draped in a sterile fashion. A time out was performed and all information was confirmed to be correct. Tegaderm placed over right nipple areola complex. Incision made in left inframammary fold scar and carried through superficial fascia and acellular dermis. Expander removed. Capsulotomies performed medially, superiorly. Additional radial scoring anterior capsule performed. Sizer placed.   I then directed attention to right breast. Incision made in right inframammary fold and carried through superficial fascia to pectoralis surface. Subfascial dissection completed with dimensions anticipated implant. Interrupted 2-0 PDS suture  placed to stabilize inframammary fold from chest wall to superficial fascia caudal to incision. Sizer placed. Patient brought to upright sitting position. A low profile 175 ml implant selected for right chest and a high profile 350 ml implant selected for left chest. Patient returned to supine position.  Each cavity irrigated with saline solution containing Ancef, gentamicin, and Betadine. Hemostasis ensured. Local anesthetic infiltrated. Over right chest, implant prepared and placed in cavity. Implant filled to 180 ml and fill tubing removed. Tab closure and implant orientation ensured. Closure completed with 3-0 vicryl in superficial fascia, 4-0 vicryl in dermis, and 4-0 monocryl subcuticular skin closure. Over left chest, implant prepared and placed in cavity. Implant filled to 360 ml and fill tubing removed. Tab closure and implant orientation ensured. Closure completed with 3-0 vicryl in superficial fascia, 4-0 vicryl in dermis, and 4-0 monocryl subcuticular skin closure. Dermabond applied followed by dry dressing and breast binder.   The patient was allowed to wake from anesthesia, extubated and taken to the recovery room in satisfactory condition.   SPECIMENS: none  DRAINS: none  Glenna Fellows, MD [facility]  Office/ physician access line after hours [phone]

## 2022-12-26 NOTE — Anesthesia Postprocedure Evaluation (Signed)
Anesthesia Post Note  Patient: Lillee Manheim  Procedure(s) Performed: REMOVAL OF LEFT TISSUE EXPANDER WITH IMMEDIATE PLACEMENT OF SALINE IMPLANT BILATERAL (Bilateral: Chest) MAMMOPLASTY AUGMENTATION (BREAST) (Right: Breast)     Patient location during evaluation: PACU Anesthesia Type: General Level of consciousness: awake and alert Pain management: pain level controlled Vital Signs Assessment: post-procedure vital signs reviewed and stable Respiratory status: spontaneous breathing, nonlabored ventilation and respiratory function stable Cardiovascular status: blood pressure returned to baseline and stable Postop Assessment: no apparent nausea or vomiting Anesthetic complications: no   No notable events documented.  Last Vitals:  Vitals:   12/26/22 1315 12/26/22 1345  BP: 136/89 (!) 144/89  Pulse: 74 86  Resp: 13 16  Temp:  (!) 36.2 C  SpO2: 100% 99%    Last Pain:  Vitals:   12/26/22 1345  TempSrc:   PainSc: 0-No pain                 Lowella Curb

## 2022-12-26 NOTE — Interval H&P Note (Signed)
History and Physical Interval Note:  12/26/2022 10:18 AM  Samantha Pena  has presented today for surgery, with the diagnosis of History breast cancer, acquired absence breast, history therapeutic radiation.  The various methods of treatment have been discussed with the patient and family. After consideration of risks, benefits and other options for treatment, the patient has consented to  removal left chest tissue expander and placement saline implant right breast augmentation with saline implant as a surgical intervention.  The patient's history has been reviewed, patient examined, no change in status, stable for surgery.  I have reviewed the patient's chart and labs.  Questions were answered to the patient's satisfaction.     Irean Hong Geral Coker

## 2022-12-26 NOTE — Transfer of Care (Signed)
Immediate Anesthesia Transfer of Care Note  Patient: Samantha Pena  Procedure(s) Performed: REMOVAL OF LEFT TISSUE EXPANDER WITH IMMEDIATE PLACEMENT OF SALINE IMPLANT BILATERAL (Bilateral: Chest) MAMMOPLASTY AUGMENTATION (BREAST) (Right: Breast)  Patient Location: PACU  Anesthesia Type:General  Level of Consciousness: drowsy and patient cooperative  Airway & Oxygen Therapy: Patient Spontanous Breathing and Patient connected to face mask oxygen  Post-op Assessment: Report given to RN and Post -op Vital signs reviewed and stable  Post vital signs: Reviewed and stable  Last Vitals:  Vitals Value Taken Time  BP 139/86 12/26/22 1235  Temp    Pulse 87 12/26/22 1236  Resp 16 12/26/22 1236  SpO2 100 % 12/26/22 1236  Vitals shown include unfiled device data.  Last Pain:  Vitals:   12/26/22 0929  TempSrc: Temporal  PainSc: 0-No pain         Complications: No notable events documented.

## 2022-12-27 ENCOUNTER — Encounter (HOSPITAL_BASED_OUTPATIENT_CLINIC_OR_DEPARTMENT_OTHER): Payer: Self-pay | Admitting: Plastic Surgery

## 2023-01-11 ENCOUNTER — Ambulatory Visit: Payer: 59 | Attending: General Surgery

## 2023-01-11 DIAGNOSIS — C50212 Malignant neoplasm of upper-inner quadrant of left female breast: Secondary | ICD-10-CM | POA: Diagnosis present

## 2023-01-11 DIAGNOSIS — Z170 Estrogen receptor positive status [ER+]: Secondary | ICD-10-CM | POA: Diagnosis present

## 2023-01-11 DIAGNOSIS — R293 Abnormal posture: Secondary | ICD-10-CM

## 2023-01-11 DIAGNOSIS — Z483 Aftercare following surgery for neoplasm: Secondary | ICD-10-CM

## 2023-01-11 DIAGNOSIS — M25612 Stiffness of left shoulder, not elsewhere classified: Secondary | ICD-10-CM

## 2023-01-11 NOTE — Therapy (Signed)
OUTPATIENT PHYSICAL THERAPY  UPPER EXTREMITY ONCOLOGY EVALUATION  Patient Name: Samantha Pena MRN: [id number] DOB:15-Sep-1965, 57 y.o., female Today's Date: 01/11/2023  END OF SESSION:  PT End of Session - 01/11/23 1353     Visit Number 5    Number of Visits 16    Date for PT Re-Evaluation 01/31/23    PT Start Time 1400    PT Stop Time 1453    PT Time Calculation (min) 53 min    Activity Tolerance Patient tolerated treatment well    Behavior During Therapy [facility] for tasks assessed/performed             Past Medical History:  Diagnosis Date   Anxiety    Cancer (HCC) 2023   breast   COVID-19    2021   Cyst 01/2011   2016 stable. near rectum that gives pt discomfort when sitting and has grown in size in the past year    History of hiatal hernia    Migraines    Past Surgical History:  Procedure Laterality Date   BREAST CYST EXCISION Left 04/06/2022   Procedure: EXCISION OF LEFT NIPPLE;  Surgeon: Emelia Loron, MD;  Location: [facility] OR;  Service: General;  Laterality: Left;   BREAST ENHANCEMENT SURGERY Right 12/26/2022   Procedure: MAMMOPLASTY AUGMENTATION (BREAST);  Surgeon: Glenna Fellows, MD;  Location: [facility];  Service: Plastics;  Laterality: Right;   BREAST LUMPECTOMY Left 04/14/2022   Procedure: LEFT BREAST SKIN EXCISION;  Surgeon: Emelia Loron, MD;  Location: [facility] ORS;  Service: General;  Laterality: Left;   BREAST RECONSTRUCTION WITH PLACEMENT OF TISSUE EXPANDER AND ALLODERM Left 01/31/2022   Procedure: BREAST RECONSTRUCTION WITH PLACEMENT OF TISSUE EXPANDER AND ALLODERM;  Surgeon: Glenna Fellows, MD;  Location: [facility] OR;  Service: Plastics;  Laterality: Left;   CERVICAL POLYPECTOMY  02/21/2021   uterus   CESAREAN SECTION  03/18/1999   FOOT SURGERY Right 09/30/2002   bone in toe had to be shaved and realigned    LAPAROSCOPIC APPENDECTOMY N/A 10/08/2020   Procedure: APPENDECTOMY LAPAROSCOPIC;  Surgeon: Emelia Loron, MD;  Location:  [facility] OR;  Service: General;  Laterality: N/A;   NIPPLE SPARING MASTECTOMY WITH SENTINEL LYMPH NODE BIOPSY Left 01/31/2022   Procedure: LEFT NIPPLE SPARING MASTECTOMY WITH AXILLARY SENTINEL LYMPH NODE BIOPSY;  Surgeon: Emelia Loron, MD;  Location: [facility] OR;  Service: General;  Laterality: Left;   REMOVAL OF BILATERAL TISSUE EXPANDERS WITH PLACEMENT OF BILATERAL BREAST IMPLANTS Bilateral 12/26/2022   Procedure: REMOVAL OF LEFT TISSUE EXPANDER WITH IMMEDIATE PLACEMENT OF SALINE IMPLANT BILATERAL;  Surgeon: Glenna Fellows, MD;  Location: [facility];  Service: Plastics;  Laterality: Bilateral;   Patient Active Problem List   Diagnosis Date Noted   S/P left mastectomy 01/31/2022   Malignant 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, estrogen receptor positive (HCC) 12/28/2021   S/P laparoscopic appendectomy 10/08/2020   History of Lyme disease 11/12/2018   Urge incontinence 11/12/2017   Overweight 05/07/2017   Allergic rhinitis 05/07/2017   Hyperlipidemia 10/31/2016   TMJ disease 03/17/2015   Anal benign neoplasm 02/14/2011   ACTINIC KERATOSIS, CHEEK, LEFT 11/13/2007   Migraine headache 10/30/2006     REFERRING PROVIDER: Emelia Loron MD  REFERRING DIAG: Left Breast Cancer, Decreased  left shoulder ROM, Right LBP  THERAPY DIAG:  Aftercare following surgery for neoplasm  Malignant 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, estrogen receptor positive (HCC)  Abnormal posture  Stiffness of left shoulder, not elsewhere  classified  ONSET DATE: 2 months ago  Rationale for Evaluation and Treatment: Rehabilitation  SUBJECTIVE:                                                                                                                                                                                           SUBJECTIVE STATEMENT:  The surgery went well, but I ended up having a rash from the Dermabond. Its not all the way cleared up  yet. The lateral trunk felt immediately better after surgery. I am still having some right sided LBP. HISTORY:  Patient was diagnosed on 12/19/2021 with left grade 2 invasive ductal carcinoma breast cancer. She underwent a left mastectomy with immediate reconstruction with expanders placed and a sentinel node biopsy (1/1 node positive) on 01/31/2022.  She had a left nipple resection on 04/14/2022.It is ER/PR positive and HER2 negative with a Ki67 of 10%. Radiation ended 07/07/2022  PAIN:  Are you having pain? YES 5/10 right LB Pain location: Right LB Pain orientation: right LB PAIN TYPE: tight and tender, hip and back Pain description: intermittent  Aggravating factors: standing, doing dishes (for right LB), . Relieving factors: shower/heat, motrin,  PRECAUTIONS: Left UE Lymphedema Risk  RED FLAGS: None   WEIGHT BEARING RESTRICTIONS: No  FALLS:  Has patient fallen in last 6 months? No  LIVING ENVIRONMENT: Lives with: lives with their spouse Lives in: House/apartment Has following equipment at home: None  OCCUPATION: not presently working  LEISURE: ride stationary bike,walking,music   HAND DOMINANCE: right   PRIOR LEVEL OF FUNCTION: Independent  PATIENT GOALS: Return to feeling more comfortable and loose, Better ROM   OBJECTIVE:  COGNITION: Overall cognitive status: Within functional limits for tasks assessed   PALPATION: Tight tender left pectorals, UT, lateral trunk, Right lumbar paraspinals  OBSERVATIONS / OTHER ASSESSMENTS: slumped sitting posture  SENSATION: Light touch: Deficits    POSTURE: forward head ,rounded shoulders  UPPER EXTREMITY AROM/PROM:  A/PROM RIGHT   eval  RIGHT 01/11/2023  Shoulder extension 60 61  Shoulder flexion 165 154  Shoulder abduction 150 120  Shoulder internal rotation 63   Shoulder external rotation 104     (Blank rows = not tested)  A/PROM LEFT   eval LEFT 12/25/2022 LEFT 01/11/2023  Shoulder extension 50 tight 55 60   Shoulder flexion 132 148 130  Shoulder abduction 118 148 tight 98  Shoulder internal rotation 70    Shoulder external rotation 90      (Blank rows = not tested)  CERVICAL AROM: All within functional limits:   LUMBAR ROM: FB felt tight, left SB felt tight on right, Right SB -  UPPER EXTREMITY STRENGTH:   LYMPHEDEMA ASSESSMENTS:   SURGERY TYPE/DATE: Left Mastectomy with Reconstruction 01/31/2022, Left Nipple Reconstruction 04/14/22  NUMBER OF LYMPH NODES REMOVED: 1/1  CHEMOTHERAPY: NO  RADIATION:to axilla only  HORMONE TREATMENT: Tamoxifen  INFECTIONS: NO   LYMPHEDEMA ASSESSMENTS:   LANDMARK RIGHT  eval  At axilla    15 cm proximal to olecranon process   10 cm proximal to olecranon process   Olecranon process   15 cm proximal to ulnar styloid process   10 cm proximal to ulnar styloid process   Just proximal to ulnar styloid process   Across hand at thumb web space   At base of 2nd digit   (Blank rows = not tested)  LANDMARK LEFT  eval  At axilla    15 cm proximal to olecranon process   10 cm proximal to olecranon process   Olecranon process   15 cm proximal to ulnar styloid process   10 cm proximal to ulnar styloid process   Just proximal to ulnar styloid process   Across hand at thumb web space   At base of 2nd digit   (Blank rows = not tested)   FUNCTIONAL TESTS:    GAIT:: WNL  QUICK DASH SURVEY: 20%   TODAY'S TREATMENT:                                                                                                                                          DATE:  01/11/2023 STM left UT/pectorals/lateral trunk and in SL to left UT scapular area with cocoa butter, and to right LB in left SL. Left pectorals, UT, lateral trunk in supine PROM Bilateral shoulder flexion,scaption, abd, IR and ER Supine shoulder AAROM flexion and stargazer x 5 ea   12/25/2022 Tender at right ITB and greater trochanter Pulleys x 2:30 ea flexion and abduction. Pt  instructed in Right ITB stretch using strap x 3 ITB rolling with stick STM left UT/pectorals/lateral trunk and in SL to left UT scapular area with cocoa butter, and to right LB in Right SL PROM left shoulder flexion,scaption, abd, IR and ER Supine on half foam roll;Bilateral shoulder flexion, scaption, horizontal abd, snow angel x 5 Measured AROM left shoulder 12/20/2022  Therapeutic Exercises Pulleys flexion and abduction x 2 min, ball rolls x 10 flexion Right SL with left shoulder abduction x 5 Supine on half foam roll;Bilateral shoulder flexion, scaption, horizontal abd, snow angel x 5 MANUAL STM left UT/pectorals/lateral trunk and in SL to left UT scapular area with cocoa butter, and to right LB in Right SL PROM left shoulder flexion,scaption, abd, IR and ER Discussed rolling on a tennis ball on the wall for her LBP  12/19/2022 Therapeutic Exercises Pulleys flexion and abduction x 2:30, ball rolls x 10 flexion Right SL with left shoulder abduction x 5 Supine on half foam roll;Bilateral shoulder flexion, scaption, horizontal abd x 5  MANUAL STM left UT/pectorals/lateral trunk and in SL to left UT scapular area with cocoa butter, and to right LB in left SL. PROM left shoulder flexion,scaption, abd, IR and ER   12/06/2022 Reviewed Supine wand flexion and scaption x 5, stargazer x 5, LTR x 5,  Updated HEP. Discussed POC;will schedule before expander exchange on 12/26/2022 and a week after depending on when MD releases her.    PATIENT EDUCATION:  Access Code: [email or web address] URL: [email or web address] Date: 12/25/2022 Prepared by: Alvira Monday  Exercises - Supine ITB Stretch with Strap  - 2 x daily - 7 x weekly - 1 sets - 3 reps - 20-30 hold Access Code: [email or web address] URL: [email or web address] Date: 12/06/2022 Prepared by: Alvira Monday  Exercises - Single Arm Doorway Pec Stretch at 90 Degrees Abduction  - 2 x daily - 7 x weekly - 1 sets - 3 reps - 20-30  hold - Standing Shoulder and Trunk Flexion at Table  - 2 x daily - 7 x weekly - 1 sets - 3 reps - 20-30 hold - Supine Shoulder Flexion with Dowel  - 2 x daily - 7 x weekly - 5 reps - 10 hold - Supine Chest Stretch with Elbows Bent  - 2 x daily - 7 x weekly - 1 sets - 5 reps - 10 hold - Supine Lower Trunk Rotation  - 2 x daily - 7 x weekly - 1 sets - 3 reps - 10 hold Education details: see above Person educated: Patient Education method: Explanation, Demonstration, and Handouts Education comprehension: verbalized understanding and returned demonstration  HOME EXERCISE PROGRAM: Single Arm Doorway Pec Stretch at 90 Degrees Abduction  - 2 x daily - 7 x weekly - 1 sets - 3 reps - 20-30 hold - Standing Shoulder and Trunk Flexion at Table  - 2 x daily - 7 x weekly - 1 sets - 3 reps - 20-30 hold - Supine Shoulder Flexion with Dowel  - 2 x daily - 7 x weekly - 5 reps - 10 hold - Supine Chest Stretch with Elbows Bent  - 2 x daily - 7 x weekly - 1 sets - 5 reps - 10 hold - Supine Lower Trunk Rotation  - 2 x daily - 7 x weekly - 1 sets - 3 reps - 10 hold  ASSESSMENT:  CLINICAL IMPRESSION: Pt with tightness bilateral pectorals, but greatly improved at lateral trunk on left since removal of expanders. Pt was unable to do any ROM activities until today so she continues with mild restriction of ROM bilaterally, visually improved after treatment today.   OBJECTIVE IMPAIRMENTS: decreased knowledge of condition, decreased ROM, increased muscle spasms, impaired UE functional use, and pain.   ACTIVITY LIMITATIONS: standing, sleeping, and reach over head  PARTICIPATION LIMITATIONS:  able to participate in most activities but limited with reaching,standing  PERSONAL FACTORS: 1-2 comorbidities: Left breast cancer s/p Mastectomy with tissue expander, radiation  are also affecting patient's functional outcome.   REHAB POTENTIAL: Excellent  CLINICAL DECISION MAKING: Stable/uncomplicated  EVALUATION  COMPLEXITY: Low  GOALS: Goals reviewed with patient? Yes  SHORT TERM GOALS: Target date: 01/03/2023  Pt will be independent and compliant with initial HEP to improve shoulder ROM Baseline: Goal status: MET 12/25/2022  2.  Pt will have decreased pain/tightness by 25% Baseline:  Goal status: INITIAL  3.  Pt will have improved shoulder flexion and abduction by atleast 15 degrees Baseline:  Goal status: INITIAL   LONG TERM GOALS: Target date: 01/31/2023  Pts quick dash  will be improved to no greater than 10% Baseline:  Goal status: INITIAL  2.  Pt will have left shoulder flexion 160 degrees and abd 145 for improved reaching ability Baseline:  Goal status: INITIAL  3.  Pt will be able to lie comfortably on her left side Baseline:  Goal status: INITIAL  4.  Pt will be able to stand longer/do dishes with min complaint of right LBP Baseline:  Goal status: INITIAL   PLAN:  PT FREQUENCY: 2x/week  PT DURATION: 8 weeks  PLANNED INTERVENTIONS: Therapeutic exercises, Neuromuscular re-education, Patient/Family education, Self Care, Dry Needling, Manual therapy, and Re-evaluation  PLAN FOR NEXT SESSION: now s/p expander exchange,pulleys, wall slides, ball rolls on wall, STM Left pectorals, UT, Lat. Trunk, Right LB, Postural education,PROM left shoulder, progress to strength when ready  Waynette Buttery, PT 01/11/2023, 2:59 PM

## 2023-01-16 ENCOUNTER — Ambulatory Visit: Payer: 59

## 2023-01-18 ENCOUNTER — Ambulatory Visit: Payer: 59

## 2023-01-18 ENCOUNTER — Encounter (HOSPITAL_BASED_OUTPATIENT_CLINIC_OR_DEPARTMENT_OTHER): Payer: Self-pay | Admitting: Plastic Surgery

## 2023-01-18 NOTE — H&P (Addendum)
Subjective:      HPI   3.5 w post op removal left chest tissue expander placement implant and right breast augmentation. Returns for concern bruising left chest. Reports onset week ago, associated muscle spasm on left. Developed repeat spasm following day and has noted bruising swelling since that time.    Presented following screening MMG with left breast distortion. Exam demonstrated palpable mass. Diagnostic MMG/US showed a 2 cm mass 11 o clock,  3 cmfn, corresponding to the palpable mass. Persistent distortion with calcs noted. Axilla normal. Biopsies labeled left breast 11 o clock showed IDC with DCIS, left breast UIQ IDC with DCIS, left breast, ER/PR+, Her2-.   MRI demonstrated left breast UIQ NME with an area of extension into the UOQ, spanning 4.7 x 3.1 cm x 3.4 cm. A single left axillary LN noted to have eccentric thickening. MR guided biopsy labeled left UOQ shoed IDC with DCIS, +LVI.   Given span of disease mastectomy recommended. Final pathology 6 cm IDC, one LN positive for carcinoma within mastectomy specimen, 1/1 SLN +, left nipple biopsy positive for carcinoma, other margins negative. She underwent excision left NAC with pathology demonstrating 2.4 cm IDC, deep margin positive. Oncotype 15. Completed adjuvant radiation 3.8.24. On tamoxifen.   Mother passed from breast cancer. Sister with breast ca age 38. Prior genetic testing negative. Mother had lumpectomy, sister had lumpectomy followed by mastectomy. Patient believes she had expander placement, note her sister has epilepsy and does not recall all details of her medical history. Sister has experienced recurrence.   Prior 36 B. Left mastectomy 283 g   MMG 12/08/2022 [id number]   Works at [employer] part time. Lives with spouse. Has one adult son in [place] and another in [place].   Review of Systems     Objective:  Physical Exam    CV: normal heart sounds normal rhythm Pulm: clear to auscultation   Chest:  Rash  nearly resolved from dermabond Right breast benign Left breast with ecchymoses consistent wit hematoma     Assessment:    Left breast cancer UIQ ER+ S/p left NSM, SLN, prepectoral TE/ADM (Alloderm) reconstruction S/p excision left NAC Adjuvant radiation S/p left chest saline implant exchange, right subfascial augmentation with saline Hematoma left chest   Plan:    Hematoma left chest. Counseled the body can resorb this but recommend OR for washout hematoma, possible implant exchange given size, implant presence and history radiation. Reviewed OP surgery likely drain placement. Offered OR tomorrow or in 4 days. Patient elected for latter. Pictures taken.   Rx for Bactrim given for post op use. She will let me know if any other Rx required on day of surgery.  Glenna Fellows, MD MBA Plastic & Reconstructive Surgery  Office/ physician access line after hours [phone]     Natrelle Smooth Round saline implant placed bilateral.  RIGHT Low Profile 175 ml implant [id number] [id number], fill volume 180 ml  LEFT High Profile 350 ml implant [id number] [id number] fill volume 360 ml

## 2023-01-22 ENCOUNTER — Other Ambulatory Visit: Payer: Self-pay

## 2023-01-22 ENCOUNTER — Encounter (HOSPITAL_BASED_OUTPATIENT_CLINIC_OR_DEPARTMENT_OTHER): Payer: Self-pay | Admitting: Plastic Surgery

## 2023-01-22 ENCOUNTER — Ambulatory Visit (HOSPITAL_BASED_OUTPATIENT_CLINIC_OR_DEPARTMENT_OTHER)
Admission: RE | Admit: 2023-01-22 | Discharge: 2023-01-22 | Disposition: A | Discharge status: Home / Self Care | Payer: 59 | Attending: Plastic Surgery | Admitting: Plastic Surgery

## 2023-01-22 ENCOUNTER — Encounter (HOSPITAL_BASED_OUTPATIENT_CLINIC_OR_DEPARTMENT_OTHER)
Admission: RE | Disposition: A | Discharge status: Home / Self Care | Payer: Self-pay | Source: Home / Self Care | Attending: Plastic Surgery

## 2023-01-22 ENCOUNTER — Ambulatory Visit (HOSPITAL_BASED_OUTPATIENT_CLINIC_OR_DEPARTMENT_OTHER): Payer: 59 | Admitting: Certified Registered"

## 2023-01-22 DIAGNOSIS — Z170 Estrogen receptor positive status [ER+]: Secondary | ICD-10-CM | POA: Insufficient documentation

## 2023-01-22 DIAGNOSIS — Z923 Personal history of irradiation: Secondary | ICD-10-CM | POA: Insufficient documentation

## 2023-01-22 DIAGNOSIS — Z803 Family history of malignant neoplasm of breast: Secondary | ICD-10-CM | POA: Diagnosis not present

## 2023-01-22 DIAGNOSIS — Z9012 Acquired absence of left breast and nipple: Secondary | ICD-10-CM | POA: Insufficient documentation

## 2023-01-22 DIAGNOSIS — Z421 Encounter for breast reconstruction following mastectomy: Secondary | ICD-10-CM | POA: Insufficient documentation

## 2023-01-22 DIAGNOSIS — Z79810 Long term (current) use of selective estrogen receptor modulators (SERMs): Secondary | ICD-10-CM | POA: Insufficient documentation

## 2023-01-22 DIAGNOSIS — C50912 Malignant neoplasm of unspecified site of left female breast: Secondary | ICD-10-CM | POA: Insufficient documentation

## 2023-01-22 DIAGNOSIS — Z9889 Other specified postprocedural states: Secondary | ICD-10-CM | POA: Diagnosis not present

## 2023-01-22 DIAGNOSIS — X58XXXA Exposure to other specified factors, initial encounter: Secondary | ICD-10-CM | POA: Insufficient documentation

## 2023-01-22 DIAGNOSIS — S20212A Contusion of left front wall of thorax, initial encounter: Secondary | ICD-10-CM | POA: Insufficient documentation

## 2023-01-22 DIAGNOSIS — Z01818 Encounter for other preprocedural examination: Secondary | ICD-10-CM

## 2023-01-22 HISTORY — PX: HEMATOMA EVACUATION: SHX5118

## 2023-01-22 HISTORY — PX: BREAST IMPLANT EXCHANGE: SHX6296

## 2023-01-22 LAB — POCT PREGNANCY, URINE: Preg Test, Ur: NEGATIVE

## 2023-01-22 SURGERY — EVACUATION HEMATOMA
Anesthesia: General | Site: Breast | Laterality: Left

## 2023-01-22 MED ORDER — CELECOXIB 200 MG PO CAPS
200.0000 mg | ORAL_CAPSULE | ORAL | Status: AC
  Administered 2023-01-22: 200 mg via ORAL

## 2023-01-22 MED ORDER — ACETAMINOPHEN 500 MG PO TABS
ORAL_TABLET | ORAL | Status: AC
  Filled 2023-01-22: qty 2

## 2023-01-22 MED ORDER — PROPOFOL 10 MG/ML IV BOLUS
INTRAVENOUS | Status: DC | PRN
  Administered 2023-01-22: 50 mg via INTRAVENOUS
  Administered 2023-01-22: 200 mg via INTRAVENOUS

## 2023-01-22 MED ORDER — PROPOFOL 10 MG/ML IV BOLUS
INTRAVENOUS | Status: AC
  Filled 2023-01-22: qty 20

## 2023-01-22 MED ORDER — BUPIVACAINE HCL (PF) 0.5 % IJ SOLN
INTRAMUSCULAR | Status: DC | PRN
Start: 2023-01-22 — End: 2023-01-22
  Administered 2023-01-22: 20 mL

## 2023-01-22 MED ORDER — CELECOXIB 200 MG PO CAPS
ORAL_CAPSULE | ORAL | Status: AC
  Filled 2023-01-22: qty 1

## 2023-01-22 MED ORDER — LIDOCAINE 2% (20 MG/ML) 5 ML SYRINGE
INTRAMUSCULAR | Status: AC
  Filled 2023-01-22: qty 5

## 2023-01-22 MED ORDER — HYDROMORPHONE HCL 1 MG/ML IJ SOLN
INTRAMUSCULAR | Status: AC
  Filled 2023-01-22: qty 0.5

## 2023-01-22 MED ORDER — FENTANYL CITRATE (PF) 100 MCG/2ML IJ SOLN
INTRAMUSCULAR | Status: AC
  Filled 2023-01-22: qty 2

## 2023-01-22 MED ORDER — DEXAMETHASONE SODIUM PHOSPHATE 10 MG/ML IJ SOLN
INTRAMUSCULAR | Status: AC
  Filled 2023-01-22: qty 1

## 2023-01-22 MED ORDER — DEXAMETHASONE SODIUM PHOSPHATE 4 MG/ML IJ SOLN
INTRAMUSCULAR | Status: DC | PRN
  Administered 2023-01-22: 8 mg via INTRAVENOUS

## 2023-01-22 MED ORDER — EPHEDRINE SULFATE (PRESSORS) 50 MG/ML IJ SOLN
INTRAMUSCULAR | Status: DC | PRN
  Administered 2023-01-22: 10 mg via INTRAVENOUS

## 2023-01-22 MED ORDER — CEFAZOLIN SODIUM-DEXTROSE 2-4 GM/100ML-% IV SOLN
2.0000 g | INTRAVENOUS | Status: AC
  Administered 2023-01-22: 2 g via INTRAVENOUS

## 2023-01-22 MED ORDER — PROPOFOL 500 MG/50ML IV EMUL
INTRAVENOUS | Status: DC | PRN
Start: 2023-01-22 — End: 2023-01-22
  Administered 2023-01-22: 150 ug/kg/min via INTRAVENOUS

## 2023-01-22 MED ORDER — LACTATED RINGERS IV SOLN: INTRAVENOUS | Status: DC

## 2023-01-22 MED ORDER — GABAPENTIN 300 MG PO CAPS
ORAL_CAPSULE | ORAL | Status: AC
  Filled 2023-01-22: qty 1

## 2023-01-22 MED ORDER — ONDANSETRON HCL 4 MG/2ML IJ SOLN
INTRAMUSCULAR | Status: AC
  Filled 2023-01-22: qty 2

## 2023-01-22 MED ORDER — SODIUM CHLORIDE 0.9 % IV SOLN
INTRAVENOUS | Status: DC | PRN
  Administered 2023-01-22: 500 mL

## 2023-01-22 MED ORDER — ACETAMINOPHEN 500 MG PO TABS: 1000.0000 mg | ORAL_TABLET | ORAL | Status: AC

## 2023-01-22 MED ORDER — CEFAZOLIN SODIUM-DEXTROSE 2-4 GM/100ML-% IV SOLN
INTRAVENOUS | Status: AC
  Filled 2023-01-22: qty 100

## 2023-01-22 MED ORDER — GABAPENTIN 300 MG PO CAPS
300.0000 mg | ORAL_CAPSULE | ORAL | Status: AC
  Administered 2023-01-22: 300 mg via ORAL

## 2023-01-22 MED ORDER — MIDAZOLAM HCL 2 MG/2ML IJ SOLN
INTRAMUSCULAR | Status: AC
  Filled 2023-01-22: qty 2

## 2023-01-22 MED ORDER — HYDROMORPHONE HCL 1 MG/ML IJ SOLN
0.5000 mg | INTRAMUSCULAR | Status: DC | PRN
  Administered 2023-01-22: 0.5 mg via INTRAVENOUS

## 2023-01-22 MED ORDER — ONDANSETRON HCL 4 MG/2ML IJ SOLN
INTRAMUSCULAR | Status: DC | PRN
  Administered 2023-01-22: 4 mg via INTRAVENOUS

## 2023-01-22 MED ORDER — BUPIVACAINE HCL (PF) 0.5 % IJ SOLN
INTRAMUSCULAR | Status: AC
  Filled 2023-01-22: qty 30

## 2023-01-22 MED ORDER — FENTANYL CITRATE (PF) 100 MCG/2ML IJ SOLN
INTRAMUSCULAR | Status: DC | PRN
  Administered 2023-01-22: 50 ug via INTRAVENOUS
  Administered 2023-01-22 (×2): 25 ug via INTRAVENOUS

## 2023-01-22 MED ORDER — SCOPOLAMINE 1 MG/3DAYS TD PT72
MEDICATED_PATCH | TRANSDERMAL | Status: AC
  Filled 2023-01-22: qty 1

## 2023-01-22 MED ORDER — FENTANYL CITRATE (PF) 100 MCG/2ML IJ SOLN
25.0000 ug | INTRAMUSCULAR | Status: DC | PRN
  Administered 2023-01-22 (×3): 50 ug via INTRAVENOUS

## 2023-01-22 MED ORDER — ACETAMINOPHEN 500 MG PO TABS
1000.0000 mg | ORAL_TABLET | Freq: Once | ORAL | Status: AC
  Administered 2023-01-22: 1000 mg via ORAL

## 2023-01-22 MED ORDER — MIDAZOLAM HCL 5 MG/5ML IJ SOLN
INTRAMUSCULAR | Status: DC | PRN
  Administered 2023-01-22: 2 mg via INTRAVENOUS

## 2023-01-22 MED ORDER — SODIUM CHLORIDE 0.9 % IV SOLN
INTRAVENOUS | Status: AC
  Filled 2023-01-22: qty 10

## 2023-01-22 MED ORDER — LIDOCAINE HCL (CARDIAC) PF 100 MG/5ML IV SOSY
PREFILLED_SYRINGE | INTRAVENOUS | Status: DC | PRN
  Administered 2023-01-22: 50 mg via INTRAVENOUS

## 2023-01-22 MED ORDER — SCOPOLAMINE 1 MG/3DAYS TD PT72
MEDICATED_PATCH | TRANSDERMAL | Status: DC | PRN
  Administered 2023-01-22: 1 via TRANSDERMAL

## 2023-01-22 SURGICAL SUPPLY — 73 items
ADH SKN CLS APL DERMABOND .7 (GAUZE/BANDAGES/DRESSINGS) ×1
APL PRP STRL LF DISP 70% ISPRP (MISCELLANEOUS) ×1
BAG DECANTER FOR FLEXI CONT (MISCELLANEOUS) ×1 IMPLANT
BINDER BREAST LRG (GAUZE/BANDAGES/DRESSINGS) IMPLANT
BINDER BREAST MEDIUM (GAUZE/BANDAGES/DRESSINGS) IMPLANT
BINDER BREAST XLRG (GAUZE/BANDAGES/DRESSINGS) IMPLANT
BINDER BREAST XXLRG (GAUZE/BANDAGES/DRESSINGS) IMPLANT
BLADE SURG 10 STRL SS (BLADE) ×1 IMPLANT
BNDG GAUZE DERMACEA FLUFF 4 (GAUZE/BANDAGES/DRESSINGS) ×1 IMPLANT
BNDG GZE DERMACEA 4 6PLY (GAUZE/BANDAGES/DRESSINGS) ×1
CANISTER SUCT 1200ML W/VALVE (MISCELLANEOUS) ×1 IMPLANT
CHLORAPREP W/TINT 26 (MISCELLANEOUS) ×1 IMPLANT
COVER BACK TABLE 60X90IN (DRAPES) ×1 IMPLANT
COVER MAYO STAND STRL (DRAPES) ×1 IMPLANT
DERMABOND ADVANCED .7 DNX12 (GAUZE/BANDAGES/DRESSINGS) ×1 IMPLANT
DRAIN CHANNEL 15F RND FF W/TCR (WOUND CARE) IMPLANT
DRAPE INCISE IOBAN 66X45 STRL (DRAPES) IMPLANT
DRAPE TOP ARMCOVERS (MISCELLANEOUS) ×1 IMPLANT
DRAPE U-SHAPE 76X120 STRL (DRAPES) ×1 IMPLANT
DRAPE UTILITY XL STRL (DRAPES) ×1 IMPLANT
ELECT BLADE 4.0 EZ CLEAN MEGAD (MISCELLANEOUS)
ELECT COATED BLADE 2.86 ST (ELECTRODE) ×1 IMPLANT
ELECT REM PT RETURN 9FT ADLT (ELECTROSURGICAL) ×1
ELECTRODE BLDE 4.0 EZ CLN MEGD (MISCELLANEOUS) IMPLANT
ELECTRODE REM PT RTRN 9FT ADLT (ELECTROSURGICAL) ×1 IMPLANT
EVACUATOR SILICONE 100CC (DRAIN) IMPLANT
FUNNEL KELLER 2 DISP (MISCELLANEOUS) IMPLANT
GAUZE PAD ABD 8X10 STRL (GAUZE/BANDAGES/DRESSINGS) ×2 IMPLANT
GLOVE BIO SURGEON STRL SZ 6 (GLOVE) ×1 IMPLANT
GLOVE BIO SURGEON STRL SZ 6.5 (GLOVE) IMPLANT
GLOVE BIOGEL PI IND STRL 6.5 (GLOVE) IMPLANT
GOWN STRL REUS W/ TWL LRG LVL3 (GOWN DISPOSABLE) ×2 IMPLANT
GOWN STRL REUS W/ TWL XL LVL3 (GOWN DISPOSABLE) IMPLANT
GOWN STRL REUS W/TWL LRG LVL3 (GOWN DISPOSABLE) ×2
GOWN STRL REUS W/TWL XL LVL3 (GOWN DISPOSABLE) ×1
IMPL SALINE SMOOTH 350CC (Breast) IMPLANT
IMPLANT SALINE SMOOTH 350CC (Breast) ×1 IMPLANT
IV NS 500ML (IV SOLUTION)
IV NS 500ML BAXH (IV SOLUTION) IMPLANT
KIT FILL ASEPTIC TRANSFER (MISCELLANEOUS) IMPLANT
MARKER SKIN DUAL TIP RULER LAB (MISCELLANEOUS) IMPLANT
NDL FILTER BLUNT 18X1 1/2 (NEEDLE) IMPLANT
NDL HYPO 25X1 1.5 SAFETY (NEEDLE) IMPLANT
NEEDLE FILTER BLUNT 18X1 1/2 (NEEDLE)
NEEDLE HYPO 25X1 1.5 SAFETY (NEEDLE)
PACK BASIN DAY SURGERY FS (CUSTOM PROCEDURE TRAY) ×1 IMPLANT
PENCIL SMOKE EVACUATOR (MISCELLANEOUS) ×1 IMPLANT
PIN SAFETY STERILE (MISCELLANEOUS) ×1 IMPLANT
SHEET MEDIUM DRAPE 40X70 STRL (DRAPES) ×1 IMPLANT
SLEEVE SCD COMPRESS KNEE MED (STOCKING) ×1 IMPLANT
SPIKE FLUID TRANSFER (MISCELLANEOUS) IMPLANT
SPONGE T-LAP 18X18 ~~LOC~~+RFID (SPONGE) ×2 IMPLANT
STAPLER VISISTAT 35W (STAPLE) ×1 IMPLANT
SUT ETHILON 2 0 FS 18 (SUTURE) IMPLANT
SUT ETHILON 4 0 PS 2 18 (SUTURE) IMPLANT
SUT MNCRL AB 3-0 PS2 27 (SUTURE) IMPLANT
SUT MNCRL AB 4-0 PS2 18 (SUTURE) IMPLANT
SUT PDS 3-0 CT2 (SUTURE)
SUT PDS AB 2-0 CT2 27 (SUTURE) IMPLANT
SUT PDS II 3-0 CT2 27 ABS (SUTURE) IMPLANT
SUT VIC AB 3-0 PS1 18 (SUTURE)
SUT VIC AB 3-0 PS1 18XBRD (SUTURE) IMPLANT
SUT VIC AB 3-0 SH 27 (SUTURE) ×1
SUT VIC AB 3-0 SH 27X BRD (SUTURE) ×1 IMPLANT
SUT VIC AB 4-0 PS2 18 (SUTURE) IMPLANT
SYR 20ML LL LF (SYRINGE) IMPLANT
SYR 50ML LL SCALE MARK (SYRINGE) IMPLANT
SYR BULB IRRIG 60ML STRL (SYRINGE) ×2 IMPLANT
SYR CONTROL 10ML LL (SYRINGE) IMPLANT
TOWEL GREEN STERILE FF (TOWEL DISPOSABLE) ×2 IMPLANT
TUBE CONNECTING 20X1/4 (TUBING) ×1 IMPLANT
UNDERPAD 30X36 HEAVY ABSORB (UNDERPADS AND DIAPERS) ×2 IMPLANT
YANKAUER SUCT BULB TIP NO VENT (SUCTIONS) ×1 IMPLANT

## 2023-01-22 NOTE — Anesthesia Procedure Notes (Signed)
Procedure Name: LMA Insertion Date/Time: 01/22/2023 8:59 AM  Performed by: Cleda Clarks, CRNAPre-anesthesia Checklist: Patient identified, Emergency Drugs available, Suction available and Patient being monitored Patient Re-evaluated:Patient Re-evaluated prior to induction Oxygen Delivery Method: Circle system utilized Preoxygenation: Pre-oxygenation with 100% oxygen Induction Type: IV induction Ventilation: Mask ventilation without difficulty LMA: LMA inserted LMA Size: 4.0 Number of attempts: 1 Airway Equipment and Method: Bite block Placement Confirmation: positive ETCO2 Tube secured with: Tape Dental Injury: Teeth and Oropharynx as per pre-operative assessment

## 2023-01-22 NOTE — Anesthesia Preprocedure Evaluation (Addendum)
Anesthesia Evaluation  Patient identified by MRN, date of birth, ID band Patient awake    Reviewed: Allergy & Precautions, NPO status , Patient's Chart, lab work & pertinent test results  Airway Mallampati: II  TM Distance: >3 FB Neck ROM: Full    Dental no notable dental hx. (+) Teeth Intact, Dental Advisory Given   Pulmonary neg pulmonary ROS   Pulmonary exam normal breath sounds clear to auscultation       Cardiovascular negative cardio ROS Normal cardiovascular exam Rhythm:Regular Rate:Normal     Neuro/Psych  Headaches PSYCHIATRIC DISORDERS Anxiety        GI/Hepatic Neg liver ROS, hiatal hernia,,,  Endo/Other  negative endocrine ROS    Renal/GU negative Renal ROS  negative genitourinary   Musculoskeletal negative musculoskeletal ROS (+)    Abdominal   Peds  Hematology negative hematology ROS (+)   Anesthesia Other Findings Left breast CA  Reproductive/Obstetrics                             Anesthesia Physical Anesthesia Plan  ASA: 2  Anesthesia Plan: General   Post-op Pain Management: Tylenol PO (pre-op)*   Induction: Intravenous  PONV Risk Score and Plan: 3 and Midazolam, Dexamethasone and Ondansetron  Airway Management Planned: Oral ETT and LMA  Additional Equipment:   Intra-op Plan:   Post-operative Plan: Extubation in OR  Informed Consent: I have reviewed the patients History and Physical, chart, labs and discussed the procedure including the risks, benefits and alternatives for the proposed anesthesia with the patient or authorized representative who has indicated his/her understanding and acceptance.     Dental advisory given  Plan Discussed with: CRNA  Anesthesia Plan Comments:        Anesthesia Quick Evaluation

## 2023-01-22 NOTE — Interval H&P Note (Signed)
History and Physical Interval Note:  01/22/2023 8:19 AM  Samantha Pena  has presented today for surgery, with the diagnosis of HEMTOMA LEFT CHEST HISTORY OF BREAST CANCE ACQUIRED ABSENCE BREAST HISTORY THERAPEUTIC RADIATION.  The various methods of treatment have been discussed with the patient and family. After consideration of risks, benefits and other options for treatment, the patient has consented to  incision drainage left chest hematoma possible saline implant exhange as a surgical intervention.  The patient's history has been reviewed, patient examined, no change in status, stable for surgery.  I have reviewed the patient's chart and labs.  Questions were answered to the patient's satisfaction.     Irean Hong Kahari Critzer

## 2023-01-22 NOTE — Anesthesia Postprocedure Evaluation (Signed)
Anesthesia Post Note  Patient: Tobi Bonnet  Procedure(s) Performed: DRAINAGE HEMATOMA LEFT CHEST, SALINE IMPLANT EXCHANGE (Left: Breast) SALINE IMPLANT EXCHANGE (Left: Breast)     Patient location during evaluation: PACU Anesthesia Type: General Level of consciousness: awake and alert Pain management: pain level controlled Vital Signs Assessment: post-procedure vital signs reviewed and stable Respiratory status: spontaneous breathing, nonlabored ventilation, respiratory function stable and patient connected to nasal cannula oxygen Cardiovascular status: blood pressure returned to baseline and stable Postop Assessment: no apparent nausea or vomiting Anesthetic complications: no  No notable events documented.  Last Vitals:  Vitals:   01/22/23 1130 01/22/23 1148  BP: 132/87 (!) 149/87  Pulse: 72 73  Resp: 10 16  Temp:  (!) 36.3 C  SpO2: 96% 100%    Last Pain:  Vitals:   01/22/23 1215  TempSrc:   PainSc: 3                  Teletha Petrea L Mirranda Monrroy

## 2023-01-22 NOTE — Discharge Instructions (Signed)
You may have tylenol again after 1:45pm today, if needed. You may have Ibuprofen/NSAIDS after 3:45pm today, if needed.   Post Anesthesia Home Care Instructions  Activity: Get plenty of rest for the remainder of the day. A responsible individual must stay with you for 24 hours following the procedure.  For the next 24 hours, DO NOT: -Drive a car -Advertising copywriter -Drink alcoholic beverages -Take any medication unless instructed by your physician -Make any legal decisions or sign important papers.  Meals: Start with liquid foods such as gelatin or soup. Progress to regular foods as tolerated. Avoid greasy, spicy, heavy foods. If nausea and/or vomiting occur, drink only clear liquids until the nausea and/or vomiting subsides. Call your physician if vomiting continues.  Special Instructions/Symptoms: Your throat may feel dry or sore from the anesthesia or the breathing tube placed in your throat during surgery. If this causes discomfort, gargle with warm salt water. The discomfort should disappear within 24 hours.  If you had a scopolamine patch placed behind your ear for the management of post- operative nausea and/or vomiting:  1. The medication in the patch is effective for 72 hours, after which it should be removed.  Wrap patch in a tissue and discard in the trash. Wash hands thoroughly with soap and water. 2. You may remove the patch earlier than 72 hours if you experience unpleasant side effects which may include dry mouth, dizziness or visual disturbances. 3. Avoid touching the patch. Wash your hands with soap and water after contact with the patch.   About my Jackson-Pratt Bulb Drain  What is a Jackson-Pratt bulb? A Jackson-Pratt is a soft, round device used to collect drainage. It is connected to a long, thin drainage catheter, which is held in place by one or two small stiches near your surgical incision site. When the bulb is squeezed, it forms a vacuum, forcing the drainage to  empty into the bulb.  Emptying the Jackson-Pratt bulb- To empty the bulb: 1. Release the plug on the top of the bulb. 2. Pour the bulb's contents into a measuring container which your nurse will provide. 3. Record the time emptied and amount of drainage. Empty the drain(s) as often as your     doctor or nurse recommends.  Date                  Time                    Amount (Drain 1)                 Amount (Drain 2)  _____________________________________________________________________  _____________________________________________________________________  _____________________________________________________________________  _____________________________________________________________________  _____________________________________________________________________  _____________________________________________________________________  _____________________________________________________________________  _____________________________________________________________________  Squeezing the Jackson-Pratt Bulb- To squeeze the bulb: 1. Make sure the plug at the top of the bulb is open. 2. Squeeze the bulb tightly in your fist. You will hear air squeezing from the bulb. 3. Replace the plug while the bulb is squeezed. 4. Use a safety pin to attach the bulb to your clothing. This will keep the catheter from     pulling at the bulb insertion site.  When to call your doctor- Call your doctor if: Drain site becomes red, swollen or hot. You have a fever greater than 101 degrees F. There is oozing at the drain site. Drain falls out (apply a guaze bandage over the drain hole and secure it with tape). Drainage increases daily not related to activity patterns. (You will usually have  more drainage when you are active than when you are resting.) Drainage has a bad odor.     JP Drain [employer] this sheet to all of your post-operative appointments while you have your drains. Please  measure your drains by CC's or ML's. Make sure you drain and measure your JP Drains 2 or 3 times per day. At the end of each day, add up totals for the left side and add up totals for the right side.    ( 9 am )     ( 3 pm )        ( 9 pm )                Date L  R  L  R  L  R  Total L/R

## 2023-01-22 NOTE — Transfer of Care (Signed)
Immediate Anesthesia Transfer of Care Note  Patient: Samantha Pena  Procedure(s) Performed: DRAINAGE HEMATOMA LEFT CHEST, SALINE IMPLANT EXCHANGE (Left: Breast) SALINE IMPLANT EXCHANGE (Left: Breast)  Patient Location: PACU  Anesthesia Type:General  Level of Consciousness: awake, alert , and oriented  Airway & Oxygen Therapy: Patient Spontanous Breathing and Patient connected to face mask oxygen  Post-op Assessment: Report given to RN and Post -op Vital signs reviewed and stable  Post vital signs: Reviewed and stable  Last Vitals:  Vitals Value Taken Time  BP 118/77 01/22/23 1010  Temp 36.3 C 01/22/23 1010  Pulse 79 01/22/23 1010  Resp 12 01/22/23 1010  SpO2 99 % 01/22/23 1010    Last Pain:  Vitals:   01/22/23 0741  TempSrc: Temporal  PainSc: 0-No pain         Complications: No notable events documented.

## 2023-01-22 NOTE — Op Note (Signed)
Operative Note   DATE OF OPERATION: 9.23.25  LOCATION: [facility]-outpatient  SURGICAL DIVISION: Plastic Surgery  PREOPERATIVE DIAGNOSES:  1. Hematoma left chest 2. History breast cancer 3. Acquired absence breast 4. History therapeutic radiation  POSTOPERATIVE DIAGNOSES:  same  PROCEDURE:  Incision drainage left chest hematoma, removal replacement left chest saline implant  SURGEON: Glenna Fellows MD MBA  ASSISTANT: none  ANESTHESIA:  General.   EBL: 100 ml hematoma  COMPLICATIONS: None immediate.   INDICATIONS FOR PROCEDURE:  The patient, Samantha Pena, is a 57 y.o. female born on [birth year], is here for drainage left chest hematoma incurred approximately 2.5 weeks following tissue expander removal and implant placement as part of staged breast reconstruction.   FINDINGS: Removed intact saline smooth implant. Approximately 100 ml hematoma removed. No active bleeding source noted. A new Natrelle High Profile smooth round 350 ml implant placed, fill volume 360 ml. REF [id number]   DESCRIPTION OF PROCEDURE:  The patient's operative site was marked with the patient in the preoperative area. The patient was taken to the operating room. SCDs were placed and IV antibiotics were given. The patient's operative site was prepped and draped in a sterile fashion. A time out was performed and all information was confirmed to be correct. Incision made in left inframammary fold scar and carried through superficial fascia to implant cavity. Hematoma, dark old blood encountered and drained. Implant removed. Cavity irrigated with saline, followed by saline solution containing Ancef gentamicin and betadine. Hemostasis ensured. 15 Fr JP placed percutaneous and secured with 2-0 nylon. Local anesthetic infiltrated. A new implant was prepared and placed and cavity. Implant filled to 360 ml fill volume. Fill tubing removed. Tab closure and implant orientation ensured. Closure completed with  2-0 PDS in superficial fascia and acellular dermis. 3-0 monocryl placed in dermis followed by running locking 4-0 nylon skin closure. Steris applied. Biopatch and Tegaderm applied over drain site. Dry dressing and breast binder applied.  The patient was allowed to wake from anesthesia, extubated and taken to the recovery room in satisfactory condition.   SPECIMENS: none  DRAINS: 15 Fr JP in left subcutaneous chest  Glenna Fellows, MD [facility] Plastic & Reconstructive Surgery  Office/ physician access line after hours [phone]

## 2023-01-23 ENCOUNTER — Encounter (HOSPITAL_BASED_OUTPATIENT_CLINIC_OR_DEPARTMENT_OTHER): Payer: Self-pay | Admitting: Plastic Surgery

## 2023-02-26 ENCOUNTER — Ambulatory Visit: Payer: 59 | Attending: General Surgery

## 2023-02-26 VITALS — Wt 157.4 lb

## 2023-02-26 DIAGNOSIS — Z483 Aftercare following surgery for neoplasm: Secondary | ICD-10-CM | POA: Insufficient documentation

## 2023-02-26 NOTE — Therapy (Signed)
OUTPATIENT PHYSICAL THERAPY SOZO SCREENING NOTE   Patient Name: Samantha Pena MRN: [id number] DOB:Mar 13, 1966, 57 y.o., female Today's Date: 02/26/2023  PCP: Shelva Majestic, MD REFERRING PROVIDER: Emelia Loron, MD   PT End of Session - 02/26/23 1519     Visit Number 5   # unchanged due to screen only   PT Start Time 1518    PT Stop Time 1522    PT Time Calculation (min) 4 min    Activity Tolerance Patient tolerated treatment well    Behavior During Therapy [facility] for tasks assessed/performed             Past Medical History:  Diagnosis Date   Anxiety    Cancer (HCC) 2023   breast   COVID-19    2021   Cyst 01/2011   2016 stable. near rectum that gives pt discomfort when sitting and has grown in size in the past year    History of hiatal hernia    Migraines    Past Surgical History:  Procedure Laterality Date   BREAST CYST EXCISION Left 04/06/2022   Procedure: EXCISION OF LEFT NIPPLE;  Surgeon: Emelia Loron, MD;  Location[facility] OR;  Service: General;  Laterality: Left;   BREAST ENHANCEMENT SURGERY Right 12/26/2022   Procedure: MAMMOPLASTY AUGMENTATION (BREAST);  Surgeon: Glenna Fellows, MD;  Location: [facility];  Service: Plastics;  Laterality: Right;   BREAST IMPLANT EXCHANGE Left 01/22/2023   Procedure: SALINE IMPLANT EXCHANGE;  Surgeon: Glenna Fellows, MD;  Location: [facility];  Service: Plastics;  Laterality: Left;   BREAST LUMPECTOMY Left 04/14/2022   Procedure: LEFT BREAST SKIN EXCISION;  Surgeon: Emelia Loron, MD;  Location: [facility] ORS;  Service: General;  Laterality: Left;   BREAST RECONSTRUCTION WITH PLACEMENT OF TISSUE EXPANDER AND ALLODERM Left 01/31/2022   Procedure: BREAST RECONSTRUCTION WITH PLACEMENT OF TISSUE EXPANDER AND ALLODERM;  Surgeon: Glenna Fellows, MD;  Location: [facility] OR;  Service: Plastics;  Laterality: Left;   CERVICAL POLYPECTOMY  02/21/2021   uterus   CESAREAN SECTION  03/18/1999   FOOT  SURGERY Right 09/30/2002   bone in toe had to be shaved and realigned    HEMATOMA EVACUATION Left 01/22/2023   Procedure: DRAINAGE HEMATOMA LEFT CHEST, SALINE IMPLANT EXCHANGE;  Surgeon: Glenna Fellows, MD;  Location: [facility];  Service: Plastics;  Laterality: Left;   LAPAROSCOPIC APPENDECTOMY N/A 10/08/2020   Procedure: APPENDECTOMY LAPAROSCOPIC;  Surgeon: Emelia Loron, MD;  Location: [facility] OR;  Service: General;  Laterality: N/A;   NIPPLE SPARING MASTECTOMY WITH SENTINEL LYMPH NODE BIOPSY Left 01/31/2022   Procedure: LEFT NIPPLE SPARING MASTECTOMY WITH AXILLARY SENTINEL LYMPH NODE BIOPSY;  Surgeon: Emelia Loron, MD;  Location: [facility] OR;  Service: General;  Laterality: Left;   REMOVAL OF BILATERAL TISSUE EXPANDERS WITH PLACEMENT OF BILATERAL BREAST IMPLANTS Bilateral 12/26/2022   Procedure: REMOVAL OF LEFT TISSUE EXPANDER WITH IMMEDIATE PLACEMENT OF SALINE IMPLANT BILATERAL;  Surgeon: Glenna Fellows, MD;  Location:  SURGERY CENTER;  Service: Plastics;  Laterality: Bilateral;   Patient Active Problem List   Diagnosis Date Noted   S/P left mastectomy 01/31/2022   Malignant 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, estrogen receptor positive (HCC) 12/28/2021   S/P laparoscopic appendectomy 10/08/2020   History of Lyme disease 11/12/2018   Urge incontinence 11/12/2017   Overweight 05/07/2017   Allergic rhinitis 05/07/2017   Hyperlipidemia 10/31/2016   TMJ disease 03/17/2015   Anal benign neoplasm 02/14/2011   ACTINIC KERATOSIS, CHEEK, LEFT  11/13/2007   Migraine headache 10/30/2006    REFERRING DIAG: left breast cancer at risk for lymphedema  THERAPY DIAG: Aftercare following surgery for neoplasm  PERTINENT HISTORY: Patient was diagnosed on 12/19/2021 with left grade 2 invasive ductal carcinoma breast cancer. She underwent a left mastectomy with immediate reconstruction with expanders placed and a sentinel node biopsy (1/1 node positive)  on 01/31/2022.  She had a left nipple resection on 04/14/2022.It is ER/PR positive and HER2 negative with a Ki67 of 10%. Radiation ended 07/07/2022   PRECAUTIONS: left UE Lymphedema risk, None  SUBJECTIVE: Pt returns for her 3 month  L-Dex screen.   PAIN:  Are you having pain? No  SOZO SCREENING: Patient was assessed today using the SOZO machine to determine the lymphedema index score. This was compared to her baseline score. It was determined that she is within the recommended range when compared to her baseline and no further action is needed at this time. She will continue SOZO screenings. These are done every 3 months for 2 years post operatively followed by every 6 months for 2 years, and then annually.   L-DEX FLOWSHEETS - 02/26/23 1500       L-DEX LYMPHEDEMA SCREENING   Measurement Type Unilateral    L-DEX MEASUREMENT EXTREMITY Upper Extremity    POSITION  Standing    DOMINANT SIDE Right    At Risk Side Left    BASELINE SCORE (UNILATERAL) -0.2    L-DEX SCORE (UNILATERAL) 3.1    VALUE CHANGE (UNILAT) 3.3               Hermenia Bers, PTA 02/26/2023, 3:22 PM

## 2023-03-12 ENCOUNTER — Telehealth: Payer: Self-pay | Admitting: *Deleted

## 2023-03-12 NOTE — Telephone Encounter (Signed)
VM left by pt stating she had blood work done recently at Dr Rana Snare (GYN) and was told she is now postmenopausal.   " So now that I am post menopausal is tamoxifen the best drug for me or is there something else I should be on as well as I have to go back to Dr Rana Snare because I have developed a new polyp that needs to be removed"  This RN returned call and obtained  identified VM- message left stating need to discuss further and requested pt to call back,  Currently pt's next appt is scheduled as a phone visit in March 2025.

## 2023-03-13 ENCOUNTER — Telehealth: Payer: Self-pay | Admitting: *Deleted

## 2023-03-13 NOTE — Telephone Encounter (Signed)
Received call from pt stating recent lab work has shown that she is now post menopausal.  Pt requesting advice from MD if needing to continue Tamoxifen. Per MD, appt is needed to discuss future AI therapy.  Appt scheduled, pt verbalized understanding of appt details.

## 2023-03-15 ENCOUNTER — Encounter: Payer: Self-pay | Admitting: Obstetrics and Gynecology

## 2023-03-20 ENCOUNTER — Telehealth: Payer: Self-pay | Admitting: Adult Health

## 2023-03-20 NOTE — Telephone Encounter (Signed)
Per scheduling message, left patient a voicemail in regards to scheduling left call back number as well

## 2023-03-21 ENCOUNTER — Telehealth: Payer: Self-pay

## 2023-03-21 ENCOUNTER — Other Ambulatory Visit: Payer: Self-pay | Admitting: Hematology and Oncology

## 2023-03-21 NOTE — Telephone Encounter (Signed)
Returned a call back to pt. And left on Vm that her appt. Date is scheduled with Samantha Pena and not with Mardella Layman. Advise pt to call back with any questions or concerns.

## 2023-04-17 ENCOUNTER — Inpatient Hospital Stay: Payer: 59 | Attending: Hematology and Oncology | Admitting: Hematology and Oncology

## 2023-04-17 VITALS — BP 129/79 | HR 72 | Temp 98.2°F | Resp 18 | Ht 66.0 in | Wt 158.3 lb

## 2023-04-17 DIAGNOSIS — Z170 Estrogen receptor positive status [ER+]: Secondary | ICD-10-CM | POA: Diagnosis not present

## 2023-04-17 DIAGNOSIS — Z923 Personal history of irradiation: Secondary | ICD-10-CM | POA: Insufficient documentation

## 2023-04-17 DIAGNOSIS — Z79810 Long term (current) use of selective estrogen receptor modulators (SERMs): Secondary | ICD-10-CM | POA: Insufficient documentation

## 2023-04-17 DIAGNOSIS — Z9012 Acquired absence of left breast and nipple: Secondary | ICD-10-CM | POA: Insufficient documentation

## 2023-04-17 DIAGNOSIS — Z780 Asymptomatic menopausal state: Secondary | ICD-10-CM | POA: Diagnosis not present

## 2023-04-17 DIAGNOSIS — Z79899 Other long term (current) drug therapy: Secondary | ICD-10-CM | POA: Insufficient documentation

## 2023-04-17 DIAGNOSIS — M545 Low back pain, unspecified: Secondary | ICD-10-CM | POA: Insufficient documentation

## 2023-04-17 DIAGNOSIS — C50212 Malignant neoplasm of upper-inner quadrant of left female breast: Secondary | ICD-10-CM | POA: Insufficient documentation

## 2023-04-17 MED ORDER — ANASTROZOLE 1 MG PO TABS: 1.0000 mg | ORAL_TABLET | Freq: Every day | ORAL | 3 refills | Status: DC

## 2023-04-17 NOTE — Progress Notes (Signed)
Patient Care Team: Shelva Majestic, MD as PCP - General (Family Medicine) Eileen Stanford, MD as Referring Physician (Allergy and Immunology) Serena Croissant, MD as Consulting Physician (Hematology and Oncology) Lonie Peak, MD as Attending Physician (Radiation Oncology) Emelia Loron, MD as Consulting Physician (General Surgery)  DIAGNOSIS:  Encounter Diagnoses  Name Primary?   Malignant 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, estrogen receptor positive (HCC) Yes   Post-menopausal     SUMMARY OF ONCOLOGIC HISTORY: Oncology History  Malignant 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, estrogen receptor positive (HCC)  12/16/2021 Initial Diagnosis   Screening mammogram detected left breast distortion and calcifications in the central upper quadrant.  Ultrasound at 11 o'clock position there was a 2 cm mass.  Axilla negative.  Ultrasound-guided biopsy revealed grade 2 invasive ductal carcinoma ER 95%, PR 100%, Ki-67 10%, HER2 negative   12/28/2021 Cancer Staging   Staging form: Breast, AJCC 8th Edition - Clinical: Stage IA (cT1c, cN0, cM0, G2, ER+, PR+, HER2-) - Signed by Serena Croissant, MD on 12/28/2021 Stage prefix: Initial diagnosis Histologic grading system: 3 grade system   01/31/2022 Surgery   Left mastectomy: Grade 2 IDC 6 cm, 1/1 lymph node positive, ER 95%, PR 100%, HER2 negative, Ki-67 10%, nipple biopsy: Positive    01/31/2022 Oncotype testing   15/14%   02/13/2022 Cancer Staging   Staging form: Breast, AJCC 8th Edition - Pathologic: Stage IB (pT3, pN1, cM0, G2, ER+, PR+, HER2-) - Signed by Serena Croissant, MD on 02/13/2022 Histologic grading system: 3 grade system   04/14/2022 Surgery   Left breast areolar excision: Invasive moderately differentiated adenocarcinoma grade 2 measures 2.4 cm, tumor present in the deep margin   05/24/2022 - 07/07/2022 Radiation Therapy   Plan Name: CW_L_BH Site: Chest Wall, Left Technique: 3D Mode:  Photon Dose Per Fraction: 1.8 Gy Prescribed Dose (Delivered / Prescribed): 25.2 Gy / 25.2 Gy Prescribed Fxs (Delivered / Prescribed): 14 / 14   Plan Name: CW_L_BH_BO Site: Chest Wall, Left Technique: 3D Mode: Photon Dose Per Fraction: 1.8 Gy Prescribed Dose (Delivered / Prescribed): 25.2 Gy / 25.2 Gy Prescribed Fxs (Delivered / Prescribed): 14 / 14   Plan Name: CW_SCV_L_BH Site: Chest Wall, Left Technique: 3D Mode: Photon Dose Per Fraction: 1.8 Gy Prescribed Dose (Delivered / Prescribed): 50.4 Gy / 50.4 Gy Prescribed Fxs (Delivered / Prescribed): 28 / 28   Plan Name: CW_L_Bst_BO Site: Chest Wall, Left Technique: Electron Mode: Electron Dose Per Fraction: 2 Gy Prescribed Dose (Delivered / Prescribed): 10 Gy / 10 Gy Prescribed Fxs (Delivered / Prescribed): 5 / 5   06/2022 -  Anti-estrogen oral therapy   Tamoxifen     CHIEF COMPLIANT: Follow-up to discuss her treatment plan between tamoxifen versus aromatase inhibitors  HISTORY OF PRESENT ILLNESS:   History of Present Illness   The patient, a breast cancer survivor, has been on tamoxifen for approximately a year and four months. She reports that she has not menstruated since May of the current year and blood work done in August indicated she was in menopause. She expresses concern about continuing tamoxifen given her menopausal status and wonders if it is contributing to a thickened uterine lining and the presence of a polyp, both of which were identified in a recent ultrasound.  In addition to her concerns about tamoxifen, the patient reports experiencing persistent lower back pain, particularly towards the end of the day or after being on her feet for extended periods. The pain sometimes radiates  to her upper back. She also reports joint stiffness in her knee and ankle on the same side as the back pain. She is unsure if these symptoms are related to tamoxifen or the onset of menopause.         ALLERGIES:  is allergic to  other.  MEDICATIONS:  Current Outpatient Medications  Medication Sig Dispense Refill   anastrozole (ARIMIDEX) 1 MG tablet Take 1 tablet (1 mg total) by mouth daily. 90 tablet 3   ALPRAZolam (XANAX) 0.25 MG tablet Take 0.25 mg by mouth as needed for anxiety.     Ascorbic Acid (VITAMIN C) 1000 MG tablet Take 1,000 mg by mouth daily as needed (immune support).     Calcium Carb-Cholecalciferol (CALTRATE 600+D3 SOFT) 600-20 MG-MCG CHEW Chew 1 tablet by mouth in the morning.     fluticasone (FLONASE) 50 MCG/ACT nasal spray PLACE 2 SPRAYS INTO BOTH NOSTRILS IN THE MORNING. 16 mL 3   ibuprofen (ADVIL) 200 MG tablet Take 400 mg by mouth every 6 (six) hours as needed for moderate pain.     Multiple Vitamin (MULTIVITAMIN WITH MINERALS) TABS tablet Take 1 tablet by mouth in the morning. Women's Multivitamin     SUMAtriptan (IMITREX) 20 MG/ACT nasal spray USE 1 SPRAY INTO EACH NOSTRIL EVERY 2 HOURS AS NEEDED (MAX TWICE PER DAY) 6 each 11   tretinoin (RETIN-A) 0.025 % cream Apply 1 application  topically daily as needed (acne/skin blemishes).     zinc gluconate 50 MG tablet Take 50 mg by mouth daily.     zolpidem (AMBIEN) 10 MG tablet Take 10 mg by mouth at bedtime as needed for sleep.     No current facility-administered medications for this visit.    PHYSICAL EXAMINATION: ECOG PERFORMANCE STATUS: 1 - Symptomatic but completely ambulatory  Vitals:   04/17/23 1122  BP: 129/79  Pulse: 72  Resp: 18  Temp: 98.2 F (36.8 C)  SpO2: 100%   Filed Weights   04/17/23 1122  Weight: 158 lb 4.8 oz (71.8 kg)   Low back pain  LABORATORY DATA:  I have reviewed the data as listed    Latest Ref Rng & Units 03/31/2022    2:00 PM 01/31/2022    7:29 AM 06/24/2021   11:52 AM  CMP  Glucose 70 - 99 mg/dL 91  161  096   BUN 6 - 20 mg/dL 17  11  15    Creatinine 0.44 - 1.00 mg/dL 0.45  4.09  8.11   Sodium 135 - 145 mmol/L 140  138  139   Potassium 3.5 - 5.1 mmol/L 4.2  3.8  4.1   Chloride 98 - 111 mmol/L 108   108  103   CO2 22 - 32 mmol/L 27  22  31    Calcium 8.9 - 10.3 mg/dL 9.5  9.0  9.3   Total Protein 6.0 - 8.3 g/dL   6.8   Total Bilirubin 0.2 - 1.2 mg/dL   0.4   Alkaline Phos 39 - 117 U/L   34   AST 0 - 37 U/L   15   ALT 0 - 35 U/L   8     Lab Results  Component Value Date   WBC 5.9 03/31/2022   HGB 12.8 03/31/2022   HCT 39.5 03/31/2022   MCV 85.9 03/31/2022   PLT 250 03/31/2022   NEUTROABS 3.1 06/24/2021    ASSESSMENT & PLAN:  Malignant 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, estrogen receptor positive (  HCC) 12/16/2021:Screening mammogram detected left breast distortion and calcifications in the central upper quadrant.  Ultrasound at 11 o'clock position there was a 2 cm mass.  Axilla negative.  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  Left mastectomy 01/31/2022: Grade 2 IDC 6 cm, 1/1 lymph node positive, ER 95%, PR 100%, HER2 negative, Ki-67 10%, nipple biopsy: Positive 2. Oncotype DX 03/01/2022: Score: 15 (distant recurrence at 9 years: 14%) 3.  04/14/2022: Left breast areolar excision: Invasive moderately differentiated carcinoma grade 2 measuring 2.4 cm, deep margin is positive. 4. Adjuvant radiation therapy 05/25/2022-07/07/2022 5. Adjuvant antiestrogen therapy with tamoxifen (starting at 10 mg daily on 12/29/2021) ----------------------------------------------------------------------------------------------------------------------------- Tamoxifen toxicities: Tolerating it extremely well.   Breast cancer surveillance: Mammogram 12/12/2022: Benign breast density category D MRI of the breast will need to be scheduled for February 2025   Patient underwent breast implant exchange on 12/08/2022, complicated by postoperative hematoma which required removal of the implant and replacing it.      Breast Cancer On Tamoxifen for 1 year and 4 months. Now in menopause with FSH of  56. Concerns about Tamoxifen contributing to uterine lining thickening and polyp formation. Discussed the benefits of switching to Anastrozole, which has fewer side effects and better efficacy in postmenopausal women. -Stop Tamoxifen today. -Start Anastrozole in 2 weeks. -Check FSH in 6 months to confirm menopause.  Low Back Pain Ongoing for a few weeks, worsens with activity and by the end of the day. Also experiencing joint stiffness. Unclear if related to Tamoxifen. -Stop Tamoxifen today and monitor if back pain improves over the next 2 weeks. -If back pain persists, consider bone scan.  Bone Health On Vitamin D supplement. Discussed risk of osteoporosis with Anastrozole. -Order bone density scan. -Continue Vitamin D supplement, consider increasing dose if current supplement is less than 2000 IU. -Consider adding calcium supplement.  Follow-up Plan for phone call at end of January to discuss back pain and tolerance of Anastrozole.          Orders Placed This Encounter  Procedures   DG Bone Density    Standing Status:   Future    Expected Date:   04/24/2023    Expiration Date:   04/16/2024    Reason for Exam (SYMPTOM  OR DIAGNOSIS REQUIRED):   post menopausal    Is patient pregnant?:   No    Preferred imaging location?:   [facility]    Release to patient:   Immediate   The patient has a good understanding of the overall plan. she agrees with it. she will call with any problems that may develop before the next visit here. Total time spent: 30 mins including face to face time and time spent for planning, charting and co-ordination of care   Tamsen Meek, MD 04/17/23

## 2023-04-17 NOTE — Assessment & Plan Note (Signed)
12/16/2021:Screening mammogram detected left breast distortion and calcifications in the central upper quadrant.  Ultrasound at 11 o'clock position there was a 2 cm mass.  Axilla negative.  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  Left mastectomy 01/31/2022: Grade 2 IDC 6 cm, 1/1 lymph node positive, ER 95%, PR 100%, HER2 negative, Ki-67 10%, nipple biopsy: Positive 2. Oncotype DX 03/01/2022: Score: 15 (distant recurrence at 9 years: 14%) 3.  04/14/2022: Left breast areolar excision: Invasive moderately differentiated carcinoma grade 2 measuring 2.4 cm, deep margin is positive. 4. Adjuvant radiation therapy 05/25/2022-07/07/2022 5. Adjuvant antiestrogen therapy with tamoxifen (starting at 10 mg daily on 12/29/2021) ----------------------------------------------------------------------------------------------------------------------------- Tamoxifen toxicities: Tolerating it extremely well. We briefly talked about Signatera testing for minimal residual disease and she would like to think about it.  I provided her with information on this.   Breast cancer surveillance: Breast exam performed by Dr. Dwain Sarna recently Mammogram 12/12/2022: Benign breast density category D MRI of the breast will need to be scheduled for February 2025   Patient is scheduled to have breast implant exchange on 12/08/2022   Return to clinic in 1 year for follow-up Our plan is to obtain mammograms alternating with breast MRIs (breast density category D) We can consider 1 MRIs every other year. Patient's sister was diagnosed with recurrent breast cancer and had bilateral mastectomies.

## 2023-04-18 ENCOUNTER — Other Ambulatory Visit: Payer: Self-pay | Admitting: Family Medicine

## 2023-04-24 ENCOUNTER — Ambulatory Visit (HOSPITAL_BASED_OUTPATIENT_CLINIC_OR_DEPARTMENT_OTHER)
Admission: RE | Admit: 2023-04-24 | Discharge: 2023-04-24 | Disposition: A | Discharge status: Home / Self Care | Payer: 59 | Source: Ambulatory Visit | Attending: Hematology and Oncology | Admitting: Hematology and Oncology

## 2023-04-24 DIAGNOSIS — Z780 Asymptomatic menopausal state: Secondary | ICD-10-CM | POA: Diagnosis present

## 2023-05-28 ENCOUNTER — Ambulatory Visit: Payer: Managed Care, Other (non HMO) | Attending: General Surgery

## 2023-05-28 VITALS — Wt 162.2 lb

## 2023-05-28 DIAGNOSIS — Z483 Aftercare following surgery for neoplasm: Secondary | ICD-10-CM | POA: Insufficient documentation

## 2023-05-28 NOTE — Therapy (Signed)
OUTPATIENT PHYSICAL THERAPY SOZO SCREENING NOTE   Patient Name: Samantha Pena MRN: [id number] DOB:May 14, 1965, 58 y.o., female Today's Date: 05/28/2023  PCP: Shelva Majestic, MD REFERRING PROVIDER: Emelia Loron, MD   PT End of Session - 05/28/23 1547     Visit Number 5   # unchanged due to screen only   PT Start Time 1545    PT Stop Time 1549    PT Time Calculation (min) 4 min    Activity Tolerance Patient tolerated treatment well    Behavior During Therapy [facility] for tasks assessed/performed             Past Medical History:  Diagnosis Date   Anxiety    Cancer (HCC) 2023   breast   COVID-19    2021   Cyst 01/2011   2016 stable. near rectum that gives pt discomfort when sitting and has grown in size in the past year    History of hiatal hernia    Migraines    Past Surgical History:  Procedure Laterality Date   BREAST CYST EXCISION Left 04/06/2022   Procedure: EXCISION OF LEFT NIPPLE;  Surgeon: Emelia Loron, MD;  Location: [facility] OR;  Service: General;  Laterality: Left;   BREAST ENHANCEMENT SURGERY Right 12/26/2022   Procedure: MAMMOPLASTY AUGMENTATION (BREAST);  Surgeon: Glenna Fellows, MD;  Location: [facility];  Service: Plastics;  Laterality: Right;   BREAST IMPLANT EXCHANGE Left 01/22/2023   Procedure: SALINE IMPLANT EXCHANGE;  Surgeon: Glenna Fellows, MD;  Location: [facility];  Service: Plastics;  Laterality: Left;   BREAST LUMPECTOMY Left 04/14/2022   Procedure: LEFT BREAST SKIN EXCISION;  Surgeon: Emelia Loron, MD;  Location: [facility];  Service: General;  Laterality: Left;   BREAST RECONSTRUCTION WITH PLACEMENT OF TISSUE EXPANDER AND ALLODERM Left 01/31/2022   Procedure: BREAST RECONSTRUCTION WITH PLACEMENT OF TISSUE EXPANDER AND ALLODERM;  Surgeon: Glenna Fellows, MD;  Location: [facility] OR;  Service: Plastics;  Laterality: Left;   CERVICAL POLYPECTOMY  02/21/2021   uterus   CESAREAN SECTION  03/18/1999   FOOT  SURGERY Right 09/30/2002   bone in toe had to be shaved and realigned    HEMATOMA EVACUATION Left 01/22/2023   Procedure: DRAINAGE HEMATOMA LEFT CHEST, SALINE IMPLANT EXCHANGE;  Surgeon: Glenna Fellows, MD;  Location: [facility];  Service: Plastics;  Laterality: Left;   LAPAROSCOPIC APPENDECTOMY N/A 10/08/2020   Procedure: APPENDECTOMY LAPAROSCOPIC;  Surgeon: Emelia Loron, MD;  Location: [facility] OR;  Service: General;  Laterality: N/A;   NIPPLE SPARING MASTECTOMY WITH SENTINEL LYMPH NODE BIOPSY Left 01/31/2022   Procedure: LEFT NIPPLE SPARING MASTECTOMY WITH AXILLARY SENTINEL LYMPH NODE BIOPSY;  Surgeon: Emelia Loron, MD;  Location: [facility] OR;  Service: General;  Laterality: Left;   REMOVAL OF BILATERAL TISSUE EXPANDERS WITH PLACEMENT OF BILATERAL BREAST IMPLANTS Bilateral 12/26/2022   Procedure: REMOVAL OF LEFT TISSUE EXPANDER WITH IMMEDIATE PLACEMENT OF SALINE IMPLANT BILATERAL;  Surgeon: Glenna Fellows, MD;  Location: [facility];  Service: Plastics;  Laterality: Bilateral;   Patient Active Problem List   Diagnosis Date Noted   S/P left mastectomy 01/31/2022   Malignant 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, estrogen receptor positive (HCC) 12/28/2021   S/P laparoscopic appendectomy 10/08/2020   History of Lyme disease 11/12/2018   Urge incontinence 11/12/2017   Overweight 05/07/2017   Allergic rhinitis 05/07/2017   Hyperlipidemia 10/31/2016   TMJ disease 03/17/2015   Anal benign neoplasm 02/14/2011   ACTINIC KERATOSIS, CHEEK, LEFT  11/13/2007   Migraine headache 10/30/2006    REFERRING DIAG: left breast cancer at risk for lymphedema  THERAPY DIAG: Aftercare following surgery for neoplasm  PERTINENT HISTORY: Patient was diagnosed on 12/19/2021 with left grade 2 invasive ductal carcinoma breast cancer. She underwent a left mastectomy with immediate reconstruction with expanders placed and a sentinel node biopsy (1/1 node positive)  on 01/31/2022.  She had a left nipple resection on 04/14/2022.It is ER/PR positive and HER2 negative with a Ki67 of 10%. Radiation ended 07/07/2022   PRECAUTIONS: left UE Lymphedema risk, None  SUBJECTIVE: Pt returns for her 3 month  L-Dex screen.   PAIN:  Are you having pain? No  SOZO SCREENING: Patient was assessed today using the SOZO machine to determine the lymphedema index score. This was compared to her baseline score. It was determined that she is within the recommended range when compared to her baseline and no further action is needed at this time. She will continue SOZO screenings. These are done every 3 months for 2 years post operatively followed by every 6 months for 2 years, and then annually.   L-DEX FLOWSHEETS - 05/28/23 1500       L-DEX LYMPHEDEMA SCREENING   Measurement Type Unilateral    L-DEX MEASUREMENT EXTREMITY Upper Extremity    POSITION  Standing    DOMINANT SIDE Right    At Risk Side Left    BASELINE SCORE (UNILATERAL) -0.2    L-DEX SCORE (UNILATERAL) 1.5    VALUE CHANGE (UNILAT) 1.7               Hermenia Bers, PTA 05/28/2023, 3:49 PM

## 2023-05-31 ENCOUNTER — Inpatient Hospital Stay: Payer: Self-pay | Attending: Hematology and Oncology | Admitting: Hematology and Oncology

## 2023-05-31 DIAGNOSIS — C50212 Malignant neoplasm of upper-inner quadrant of left female breast: Secondary | ICD-10-CM

## 2023-05-31 DIAGNOSIS — Z170 Estrogen receptor positive status [ER+]: Secondary | ICD-10-CM

## 2023-05-31 NOTE — Progress Notes (Signed)
HEMATOLOGY-ONCOLOGY TELEPHONE VISIT PROGRESS NOTE  I connected with our patient on 05/31/23 at  2:00 PM EST by telephone and verified that I am speaking with the correct person using two identifiers.  I discussed the limitations, risks, security and privacy concerns of performing an evaluation and management service by telephone and the availability of in person appointments.  I also discussed with the patient that there may be a patient responsible charge related to this service. The patient expressed understanding and agreed to proceed.   History of Present Illness: Telephone follow-up to discuss tolerance to anastrozole therapy  History of Present Illness   The patient presents for a follow-up regarding medication tolerance and bone health.  She started a new medication on January 6th and has been taking it for a few weeks without any new or different symptoms, indicating good tolerance.  She continues to experience lower back pain, attributed to arthritis, which has been present for months. The pain is more pronounced when standing or sitting, but not while walking. She manages the pain with ibuprofen as needed.  A bone density test conducted on December 24th revealed a T score of minus 1.2, indicating mild osteopenia. She has been taking calcium chewables in the morning for years as part of her routine.  An MRI mammogram is scheduled for February 11th.        Oncology History  Malignant 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, estrogen receptor positive (HCC)  12/16/2021 Initial Diagnosis   Screening mammogram detected left breast distortion and calcifications in the central upper quadrant.  Ultrasound at 11 o'clock position there was a 2 cm mass.  Axilla negative.  Ultrasound-guided biopsy revealed grade 2 invasive ductal carcinoma ER 95%, PR 100%, Ki-67 10%, HER2 negative   12/28/2021 Cancer Staging   Staging form: Breast, AJCC 8th Edition - Clinical: Stage IA  (cT1c, cN0, cM0, G2, ER+, PR+, HER2-) - Signed by Serena Croissant, MD on 12/28/2021 Stage prefix: Initial diagnosis Histologic grading system: 3 grade system   01/31/2022 Surgery   Left mastectomy: Grade 2 IDC 6 cm, 1/1 lymph node positive, ER 95%, PR 100%, HER2 negative, Ki-67 10%, nipple biopsy: Positive    01/31/2022 Oncotype testing   15/14%   02/13/2022 Cancer Staging   Staging form: Breast, AJCC 8th Edition - Pathologic: Stage IB (pT3, pN1, cM0, G2, ER+, PR+, HER2-) - Signed by Serena Croissant, MD on 02/13/2022 Histologic grading system: 3 grade system   04/14/2022 Surgery   Left breast areolar excision: Invasive moderately differentiated adenocarcinoma grade 2 measures 2.4 cm, tumor present in the deep margin   05/24/2022 - 07/07/2022 Radiation Therapy   Plan Name: CW_L_BH Site: Chest Wall, Left Technique: 3D Mode: Photon Dose Per Fraction: 1.8 Gy Prescribed Dose (Delivered / Prescribed): 25.2 Gy / 25.2 Gy Prescribed Fxs (Delivered / Prescribed): 14 / 14   Plan Name: CW_L_BH_BO Site: Chest Wall, Left Technique: 3D Mode: Photon Dose Per Fraction: 1.8 Gy Prescribed Dose (Delivered / Prescribed): 25.2 Gy / 25.2 Gy Prescribed Fxs (Delivered / Prescribed): 14 / 14   Plan Name: CW_SCV_L_BH Site: Chest Wall, Left Technique: 3D Mode: Photon Dose Per Fraction: 1.8 Gy Prescribed Dose (Delivered / Prescribed): 50.4 Gy / 50.4 Gy Prescribed Fxs (Delivered / Prescribed): 28 / 28   Plan Name: CW_L_Bst_BO Site: Chest Wall, Left Technique: Electron Mode: Electron Dose Per Fraction: 2 Gy Prescribed Dose (Delivered / Prescribed): 10 Gy / 10 Gy Prescribed Fxs (Delivered / Prescribed): 5 / 5   06/2022 -  Anti-estrogen oral therapy   Tamoxifen     REVIEW OF SYSTEMS:   Constitutional: Denies fevers, chills or abnormal weight loss All other systems were reviewed with the patient and are negative. Observations/Objective:     Assessment Plan:  Malignant neoplasm of upper-inner  quadrant of left breast in female, estrogen receptor positive (HCC) 12/16/2021:Screening mammogram detected left breast distortion and calcifications in the central upper quadrant.  Ultrasound at 11 o'clock position there was a 2 cm mass.  Axilla negative.  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  Left mastectomy 01/31/2022: Grade 2 IDC 6 cm, 1/1 lymph node positive, ER 95%, PR 100%, HER2 negative, Ki-67 10%, nipple biopsy: Positive 2. Oncotype DX 03/01/2022: Score: 15 (distant recurrence at 9 years: 14%) 3.  04/14/2022: Left breast areolar excision: Invasive moderately differentiated carcinoma grade 2 measuring 2.4 cm, deep margin is positive. 4. Adjuvant radiation therapy 05/25/2022-07/07/2022 5. Adjuvant antiestrogen therapy with tamoxifen (starting at 10 mg daily on 12/29/2021), switched to anastrozole 04/17/2023 ----------------------------------------------------------------------------------------------------------------------------- Anastrozole toxicities: Tolerating anastrozole extremely well.   Breast cancer surveillance: Mammogram 12/12/2022: Benign breast density category D MRI of the breast will need to be scheduled for February 2025   Patient underwent breast implant exchange on 12/08/2022, complicated by postoperative hematoma which required removal of the implant and replacing it.  --------------------------------- Assessment and Plan    Chronic Lower Back Pain Chronic lower back pain, likely due to arthritis. Pain exacerbated by standing or sitting, but not by walking. Managed with ibuprofen as needed. - Continue ibuprofen as needed for pain management  Mild Osteopenia Bone density scan on December 24th showed a T score of -1.2, indicating mild osteopenia. Patient taking calcium and vitamin D supplements. Recommended walking and weight-bearing exercises and continued vitamin D  supplementation. - Continue walking and weight-bearing exercises - Continue vitamin D supplementation  General Health Maintenance Due for MRI mammogram on February 11th. Routine mammogram scheduled for August. - Perform MRI mammogram on February 11th - Schedule routine mammogram in August  Follow-up - Schedule annual follow-up visit - Call patient a few days after MRI to discuss results.          I discussed the assessment and treatment plan with the patient. The patient was provided an opportunity to ask questions and all were answered. The patient agreed with the plan and demonstrated an understanding of the instructions. The patient was advised to call back or seek an in-person evaluation if the symptoms worsen or if the condition fails to improve as anticipated.   I provided 20 minutes of non-face-to-face time during this encounter.  This includes time for charting and coordination of care   Tamsen Meek, MD

## 2023-05-31 NOTE — Assessment & Plan Note (Signed)
12/16/2021:Screening mammogram detected left breast distortion and calcifications in the central upper quadrant.  Ultrasound at 11 o'clock position there was a 2 cm mass.  Axilla negative.  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  Left mastectomy 01/31/2022: Grade 2 IDC 6 cm, 1/1 lymph node positive, ER 95%, PR 100%, HER2 negative, Ki-67 10%, nipple biopsy: Positive 2. Oncotype DX 03/01/2022: Score: 15 (distant recurrence at 9 years: 14%) 3.  04/14/2022: Left breast areolar excision: Invasive moderately differentiated carcinoma grade 2 measuring 2.4 cm, deep margin is positive. 4. Adjuvant radiation therapy 05/25/2022-07/07/2022 5. Adjuvant antiestrogen therapy with tamoxifen (starting at 10 mg daily on 12/29/2021), switched to anastrozole 04/17/2023 ----------------------------------------------------------------------------------------------------------------------------- Anastrozole toxicities: Tolerating anastrozole extremely well.   Breast cancer surveillance: Mammogram 12/12/2022: Benign breast density category D MRI of the breast will need to be scheduled for February 2025   Patient underwent breast implant exchange on 12/08/2022, complicated by postoperative hematoma which required removal of the implant and replacing it.

## 2023-06-12 ENCOUNTER — Ambulatory Visit
Admission: RE | Admit: 2023-06-12 | Discharge: 2023-06-12 | Disposition: A | Discharge status: Home / Self Care | Payer: Managed Care, Other (non HMO) | Source: Ambulatory Visit | Attending: Hematology and Oncology | Admitting: Hematology and Oncology

## 2023-06-12 DIAGNOSIS — C50212 Malignant neoplasm of upper-inner quadrant of left female breast: Secondary | ICD-10-CM

## 2023-06-12 MED ORDER — GADOPICLENOL 0.5 MMOL/ML IV SOLN
7.0000 mL | Freq: Once | INTRAVENOUS | Status: AC | PRN
  Administered 2023-06-12: 7 mL via INTRAVENOUS

## 2023-06-19 ENCOUNTER — Other Ambulatory Visit: Payer: Self-pay | Admitting: Family Medicine

## 2023-07-03 ENCOUNTER — Inpatient Hospital Stay: Payer: 59 | Attending: Hematology and Oncology | Admitting: Hematology and Oncology

## 2023-07-03 DIAGNOSIS — Z170 Estrogen receptor positive status [ER+]: Secondary | ICD-10-CM | POA: Diagnosis not present

## 2023-07-03 DIAGNOSIS — C50212 Malignant neoplasm of upper-inner quadrant of left female breast: Secondary | ICD-10-CM

## 2023-07-03 NOTE — Progress Notes (Signed)
 HEMATOLOGY-ONCOLOGY TELEPHONE VISIT PROGRESS NOTE  I connected with our patient on 07/03/23 at  9:15 AM EST by telephone and verified that I am speaking with the correct person using two identifiers.  I discussed the limitations, risks, security and privacy concerns of performing an evaluation and management service by telephone and the availability of in person appointments.  I also discussed with the patient that there may be a patient responsible charge related to this service. The patient expressed understanding and agreed to proceed.   History of Present Illness: Follow-up to discuss breast MRI  History of Present Illness The patient, with a history of breast cysts and on anastrozole, presents for a follow-up on recent MRI results and medication side effects. She reports occasional joint stiffness and aches, particularly in the knee, ankle, and lower back. These symptoms are transient and seem to come and go. The patient maintains an active lifestyle, walking at least a mile daily.  The recent MRI showed some cysts in the right breast, similar to previous findings. The breast density was reported as category C, less dense than the previous mammogram which was category D. The patient is currently on anastrozole, which she tolerates fairly well despite the occasional joint discomfort.    Oncology History  Malignant 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, estrogen receptor positive (HCC)  12/16/2021 Initial Diagnosis   Screening mammogram detected left breast distortion and calcifications in the central upper quadrant.  Ultrasound at 11 o'clock position there was a 2 cm mass.  Axilla negative.  Ultrasound-guided biopsy revealed grade 2 invasive ductal carcinoma ER 95%, PR 100%, Ki-67 10%, HER2 negative   12/28/2021 Cancer Staging   Staging form: Breast, AJCC 8th Edition - Clinical: Stage IA (cT1c, cN0, cM0, G2, ER+, PR+, HER2-) - Signed by Serena Croissant, MD on 12/28/2021 Stage  prefix: Initial diagnosis Histologic grading system: 3 grade system   01/31/2022 Surgery   Left mastectomy: Grade 2 IDC 6 cm, 1/1 lymph node positive, ER 95%, PR 100%, HER2 negative, Ki-67 10%, nipple biopsy: Positive    01/31/2022 Oncotype testing   15/14%   02/13/2022 Cancer Staging   Staging form: Breast, AJCC 8th Edition - Pathologic: Stage IB (pT3, pN1, cM0, G2, ER+, PR+, HER2-) - Signed by Serena Croissant, MD on 02/13/2022 Histologic grading system: 3 grade system   04/14/2022 Surgery   Left breast areolar excision: Invasive moderately differentiated adenocarcinoma grade 2 measures 2.4 cm, tumor present in the deep margin   05/24/2022 - 07/07/2022 Radiation Therapy   Plan Name: CW_L_BH Site: Chest Wall, Left Technique: 3D Mode: Photon Dose Per Fraction: 1.8 Gy Prescribed Dose (Delivered / Prescribed): 25.2 Gy / 25.2 Gy Prescribed Fxs (Delivered / Prescribed): 14 / 14   Plan Name: CW_L_BH_BO Site: Chest Wall, Left Technique: 3D Mode: Photon Dose Per Fraction: 1.8 Gy Prescribed Dose (Delivered / Prescribed): 25.2 Gy / 25.2 Gy Prescribed Fxs (Delivered / Prescribed): 14 / 14   Plan Name: CW_SCV_L_BH Site: Chest Wall, Left Technique: 3D Mode: Photon Dose Per Fraction: 1.8 Gy Prescribed Dose (Delivered / Prescribed): 50.4 Gy / 50.4 Gy Prescribed Fxs (Delivered / Prescribed): 28 / 28   Plan Name: CW_L_Bst_BO Site: Chest Wall, Left Technique: Electron Mode: Electron Dose Per Fraction: 2 Gy Prescribed Dose (Delivered / Prescribed): 10 Gy / 10 Gy Prescribed Fxs (Delivered / Prescribed): 5 / 5   06/2022 -  Anti-estrogen oral therapy   Tamoxifen     REVIEW OF SYSTEMS:   Constitutional: Denies fevers, chills  or abnormal weight loss All other systems were reviewed with the patient and are negative. Observations/Objective:     Assessment Plan:  Malignant 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, estrogen receptor positive (HCC) 12/16/2021:Screening  mammogram detected left breast distortion and calcifications in the central upper quadrant.  Ultrasound at 11 o'clock position there was a 2 cm mass.  Axilla negative.  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  Left mastectomy 01/31/2022: Grade 2 IDC 6 cm, 1/1 lymph node positive, ER 95%, PR 100%, HER2 negative, Ki-67 10%, nipple biopsy: Positive 2. Oncotype DX 03/01/2022: Score: 15 (distant recurrence at 9 years: 14%) 3.  04/14/2022: Left breast areolar excision: Invasive moderately differentiated carcinoma grade 2 measuring 2.4 cm, deep margin is positive. 4. Adjuvant radiation therapy 05/25/2022-07/07/2022 5. Adjuvant antiestrogen therapy with tamoxifen (starting at 10 mg daily on 12/29/2021), switched to anastrozole 04/17/2023 ----------------------------------------------------------------------------------------------------------------------------- Anastrozole toxicities: Tolerating anastrozole extremely well. Occasional aches and pains (knee and ankle and back)   Breast cancer surveillance: Mammogram 12/12/2022: Benign breast density category D MRI of the breast 06/12/2023: No evidence of malignancy, cysts in the right breast, density category C.  We may consider doing MRIs every other year.  I will discuss this when we meet with her in February next year.   Bone density 04/24/2023: T-score -1.2: Mild osteopenia: Calcium vitamin D and exercises.  Patient underwent breast implant exchange on 12/08/2022, complicated by postoperative hematoma which required removal of the implant and replacing it.       I discussed the assessment and treatment plan with the patient. The patient was provided an opportunity to ask questions and all were answered. The patient agreed with the plan and demonstrated an understanding of the instructions. The patient was advised to call back or seek an in-person evaluation if the  symptoms worsen or if the condition fails to improve as anticipated.   I provided 20 minutes of non-face-to-face time during this encounter.  This includes time for charting and coordination of care   Tamsen Meek, MD

## 2023-07-03 NOTE — Assessment & Plan Note (Signed)
 12/16/2021:Screening mammogram detected left breast distortion and calcifications in the central upper quadrant.  Ultrasound at 11 o'clock position there was a 2 cm mass.  Axilla negative.  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  Left mastectomy 01/31/2022: Grade 2 IDC 6 cm, 1/1 lymph node positive, ER 95%, PR 100%, HER2 negative, Ki-67 10%, nipple biopsy: Positive 2. Oncotype DX 03/01/2022: Score: 15 (distant recurrence at 9 years: 14%) 3.  04/14/2022: Left breast areolar excision: Invasive moderately differentiated carcinoma grade 2 measuring 2.4 cm, deep margin is positive. 4. Adjuvant radiation therapy 05/25/2022-07/07/2022 5. Adjuvant antiestrogen therapy with tamoxifen (starting at 10 mg daily on 12/29/2021), switched to anastrozole 04/17/2023 ----------------------------------------------------------------------------------------------------------------------------- Anastrozole toxicities: Tolerating anastrozole extremely well.   Breast cancer surveillance: Mammogram 12/12/2022: Benign breast density category D MRI of the breast 06/12/2023: No evidence of malignancy, cysts in the right breast, density category C   Patient underwent breast implant exchange on 12/08/2022, complicated by postoperative hematoma which required removal of the implant and replacing it.

## 2023-08-12 ENCOUNTER — Other Ambulatory Visit: Payer: Self-pay | Admitting: Family Medicine

## 2023-08-13 NOTE — Telephone Encounter (Signed)
 Copied from CRM [id number]. Topic: Clinical - Medication Refill >> Aug 13, 2023  2:19 PM Lane Pinon A wrote: Most Recent Primary Care Visit:  Provider: Waldo Guitar, ANN MARIE  Department: [facility]  Visit Type: OFFICE VISIT  Date: 06/24/2021  Medication: fluticasone (FLONASE) 50 MCG/ACT nasal spray  Has the patient contacted their pharmacy? No (Agent: If no, request that the patient contact the pharmacy for the refill. If patient does not wish to contact the pharmacy document the reason why and proceed with request.) (Agent: If yes, when and what did the pharmacy advise?)  Is this the correct pharmacy for this prescription? Yes If no, delete pharmacy and type the correct one.  This is the patient's preferred pharmacy:  [id number] [place], [place] - [address] [place] [address] Phone: [phone] Fax: [phone]   Has the prescription been filled recently? Yes  Is the patient out of the medication? Yes  Has the patient been seen for an appointment in the last year OR does the patient have an upcoming appointment? Yes  Can we respond through MyChart? Yes  Agent: Please be advised that Rx refills may take up to 3 business days. We ask that you follow-up with your pharmacy.

## 2023-08-23 ENCOUNTER — Telehealth: Payer: Self-pay | Admitting: Family Medicine

## 2023-08-23 ENCOUNTER — Other Ambulatory Visit: Payer: Self-pay

## 2023-08-23 ENCOUNTER — Other Ambulatory Visit: Payer: Self-pay | Admitting: Family Medicine

## 2023-08-23 MED ORDER — FLUTICASONE PROPIONATE 50 MCG/ACT NA SUSP: 2.0000 | Freq: Every morning | NASAL | 3 refills | Status: AC

## 2023-08-23 NOTE — Telephone Encounter (Signed)
 Copied from CRM [id number]. Topic: Clinical - Medication Refill >> Aug 13, 2023  2:19 PM Lane Pinon A wrote: Most Recent Primary Care Visit:  Provider: Waldo Guitar, ANN MARIE  Department: [facility]  Visit Type: OFFICE VISIT  Date: 06/24/2021  Medication: fluticasone  (FLONASE ) 50 MCG/ACT nasal spray  Has the patient contacted their pharmacy? No (Agent: If no, request that the patient contact the pharmacy for the refill. If patient does not wish to contact the pharmacy document the reason why and proceed with request.) (Agent: If yes, when and what did the pharmacy advise?)  Is this the correct pharmacy for this prescription? Yes If no, delete pharmacy and type the correct one.  This is the patient's preferred pharmacy:  [id number] [place], [place] - [address] [place] [address] Phone: [phone] Fax: [phone]   Has the prescription been filled recently? Yes  Is the patient out of the medication? Yes  Has the patient been seen for an appointment in the last year OR does the patient have an upcoming appointment? Yes  Can we respond through MyChart? Yes  Agent: Please be advised that Rx refills may take up to 3 business days. We ask that you follow-up with your pharmacy. >> Aug 23, 2023  9:54 AM Emylou G wrote: Patient called checking status of refill

## 2023-08-23 NOTE — Telephone Encounter (Signed)
 Refill sent to pharmacy.

## 2023-09-10 ENCOUNTER — Ambulatory Visit: Payer: Managed Care, Other (non HMO) | Attending: General Surgery

## 2023-09-10 VITALS — Wt 167.1 lb

## 2023-09-10 DIAGNOSIS — Z483 Aftercare following surgery for neoplasm: Secondary | ICD-10-CM | POA: Insufficient documentation

## 2023-09-10 NOTE — Therapy (Signed)
 OUTPATIENT PHYSICAL THERAPY SOZO SCREENING NOTE   Patient Name: Samantha Pena MRN: [id number] DOB:[birth year], 58 y.o., female Today's Date: 09/10/2023  PCP: Almira Jaeger, MD REFERRING PROVIDER: Enid Harry, MD   PT End of Session - 09/10/23 1538     Visit Number 5   # unchanged due to screen only   PT Start Time 1536    PT Stop Time 1540    PT Time Calculation (min) 4 min    Activity Tolerance Patient tolerated treatment well    Behavior During Therapy [facility] for tasks assessed/performed             Past Medical History:  Diagnosis Date   Anxiety    Cancer (HCC) 2023   breast   COVID-19    2021   Cyst 01/2011   2016 stable. near rectum that gives pt discomfort when sitting and has grown in size in the past year    History of hiatal hernia    Migraines    Past Surgical History:  Procedure Laterality Date   BREAST CYST EXCISION Left 04/06/2022   Procedure: EXCISION OF LEFT NIPPLE;  Surgeon: Enid Harry, MD;  Location: [facility] OR;  Service: General;  Laterality: Left;   BREAST ENHANCEMENT SURGERY Right 12/26/2022   Procedure: MAMMOPLASTY AUGMENTATION (BREAST);  Surgeon: Alger Infield, MD;  Location: [facility];  Service: Plastics;  Laterality: Right;   BREAST IMPLANT EXCHANGE Left 01/22/2023   Procedure: SALINE IMPLANT EXCHANGE;  Surgeon: Alger Infield, MD;  Location: [facility];  Service: Plastics;  Laterality: Left;   BREAST LUMPECTOMY Left 04/14/2022   Procedure: LEFT BREAST SKIN EXCISION;  Surgeon: Enid Harry, MD;  Location: [facility];  Service: General;  Laterality: Left;   BREAST RECONSTRUCTION WITH PLACEMENT OF TISSUE EXPANDER AND ALLODERM Left 01/31/2022   Procedure: BREAST RECONSTRUCTION WITH PLACEMENT OF TISSUE EXPANDER AND ALLODERM;  Surgeon: Alger Infield, MD;  Location: [facility] OR;  Service: Plastics;  Laterality: Left;   CERVICAL POLYPECTOMY  02/21/2021   uterus   CESAREAN SECTION  03/18/1999   FOOT  SURGERY Right 09/30/2002   bone in toe had to be shaved and realigned    HEMATOMA EVACUATION Left 01/22/2023   Procedure: DRAINAGE HEMATOMA LEFT CHEST, SALINE IMPLANT EXCHANGE;  Surgeon: Alger Infield, MD;  Location: [facility];  Service: Plastics;  Laterality: Left;   LAPAROSCOPIC APPENDECTOMY N/A 10/08/2020   Procedure: APPENDECTOMY LAPAROSCOPIC;  Surgeon: Enid Harry, MD;  Location: [facility] OR;  Service: General;  Laterality: N/A;   NIPPLE SPARING MASTECTOMY WITH SENTINEL LYMPH NODE BIOPSY Left 01/31/2022   Procedure: LEFT NIPPLE SPARING MASTECTOMY WITH AXILLARY SENTINEL LYMPH NODE BIOPSY;  Surgeon: Enid Harry, MD;  Location[facility];  Service: General;  Laterality: Left;   REMOVAL OF BILATERAL TISSUE EXPANDERS WITH PLACEMENT OF BILATERAL BREAST IMPLANTS Bilateral 12/26/2022   Procedure: REMOVAL OF LEFT TISSUE EXPANDER WITH IMMEDIATE PLACEMENT OF SALINE IMPLANT BILATERAL;  Surgeon: Alger Infield, MD;  Location: [facility];  Service: Plastics;  Laterality: Bilateral;   Patient Active Problem List   Diagnosis Date Noted   S/P left mastectomy 01/31/2022   Malignant 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, estrogen receptor positive (HCC) 12/28/2021   S/P laparoscopic appendectomy 10/08/2020   History of Lyme disease 11/12/2018   Urge incontinence 11/12/2017   Overweight 05/07/2017   Allergic rhinitis 05/07/2017   Hyperlipidemia 10/31/2016   TMJ disease 03/17/2015   Anal benign neoplasm 02/14/2011   ACTINIC KERATOSIS, CHEEK, LEFT  11/13/2007   Migraine headache 10/30/2006    REFERRING DIAG: left breast cancer at risk for lymphedema  THERAPY DIAG: Aftercare following surgery for neoplasm  PERTINENT HISTORY: Patient was diagnosed on 12/19/2021 with left grade 2 invasive ductal carcinoma breast cancer. She underwent a left mastectomy with immediate reconstruction with expanders placed and a sentinel node biopsy (1/1 node positive)  on 01/31/2022.  She had a left nipple resection on 04/14/2022.It is ER/PR positive and HER2 negative with a Ki67 of 10%. Radiation ended 07/07/2022   PRECAUTIONS: left UE Lymphedema risk, None  SUBJECTIVE: Pt returns for her 3 month  L-Dex screen.   PAIN:  Are you having pain? No  SOZO SCREENING: Patient was assessed today using the SOZO machine to determine the lymphedema index score. This was compared to her baseline score. It was determined that she is within the recommended range when compared to her baseline and no further action is needed at this time. She will continue SOZO screenings. These are done every 3 months for 2 years post operatively followed by every 6 months for 2 years, and then annually.   L-DEX FLOWSHEETS - 09/10/23 1500       L-DEX LYMPHEDEMA SCREENING   Measurement Type Unilateral    L-DEX MEASUREMENT EXTREMITY Upper Extremity    POSITION  Standing    DOMINANT SIDE Right    At Risk Side Left    BASELINE SCORE (UNILATERAL) -0.2    L-DEX SCORE (UNILATERAL) 0.9    VALUE CHANGE (UNILAT) 1.1               Denyce Flank, PTA 09/10/2023, 3:40 PM

## 2023-09-21 ENCOUNTER — Ambulatory Visit (INDEPENDENT_AMBULATORY_CARE_PROVIDER_SITE_OTHER)

## 2023-09-21 ENCOUNTER — Encounter: Payer: Self-pay | Admitting: Podiatry

## 2023-09-21 ENCOUNTER — Ambulatory Visit (INDEPENDENT_AMBULATORY_CARE_PROVIDER_SITE_OTHER): Admitting: Podiatry

## 2023-09-21 VITALS — Ht 66.0 in | Wt 167.0 lb

## 2023-09-21 DIAGNOSIS — M722 Plantar fascial fibromatosis: Secondary | ICD-10-CM

## 2023-09-21 MED ORDER — DICLOFENAC SODIUM 75 MG PO TBEC: 75.0000 mg | DELAYED_RELEASE_TABLET | Freq: Two times a day (BID) | ORAL | 2 refills | Status: DC

## 2023-09-21 MED ORDER — TRIAMCINOLONE ACETONIDE 10 MG/ML IJ SUSP
10.0000 mg | Freq: Once | INTRAMUSCULAR | Status: AC
Start: 2023-09-21 — End: 2023-09-21
  Administered 2023-09-21: 10 mg via INTRA_ARTICULAR

## 2023-09-21 NOTE — Patient Instructions (Signed)

## 2023-09-21 NOTE — Progress Notes (Signed)
 Subjective:   Patient ID: Samantha Pena, female   DOB: 58 y.o.   MRN: [id number]   HPI Patient presents stating she is having severe pain in the bottom of her right heel of approximate 4 months duration and states it is getting worse daily and her daughter is getting married in 2 weeks.  States that she is having trouble bearing weight down on the foot all and is desperate for relief with the events coming up.  The patient does not smoke likes to be active   Review of Systems  All other systems reviewed and are negative.       Objective:  Physical Exam Vitals and nursing note reviewed.  Constitutional:      Appearance: She is well-developed.  Pulmonary:     Effort: Pulmonary effort is normal.  Musculoskeletal:        General: Normal range of motion.  Skin:    General: Skin is warm.  Neurological:     Mental Status: She is alert.     Neurovascular status intact muscle strength adequate range of motion adequate exquisite discomfort noted medial aspect right fashion to the central band quite a bit of swelling noted in the foot with the patient found to have good digital perfusion well oriented x 3     Assessment:  Acute plantar fasciitis right inflammation fluid of the medial band at the insertion with swelling of the heel right noted     Plan:  H&P reviewed at great length discussed her daughter getting married and at this point I did do sterile prep and injected the plantar fascia right 3 mg Kenalog  5 mg Xylocaine  placed on diclofenac  twice daily and dispensed air fracture walker to completely immobilize the plantar heel.  May require another aggressive response but at this point I am hopeful this will take care of the problem.  Reappoint 3 to 4 weeks or earlier if needed  X-rays indicate spur no indication stress fracture arthritis

## 2023-10-19 ENCOUNTER — Encounter: Payer: Self-pay | Admitting: Podiatry

## 2023-10-19 ENCOUNTER — Ambulatory Visit: Admitting: Podiatry

## 2023-10-19 DIAGNOSIS — M722 Plantar fascial fibromatosis: Secondary | ICD-10-CM | POA: Diagnosis not present

## 2023-10-19 MED ORDER — TRIAMCINOLONE ACETONIDE 10 MG/ML IJ SUSP
10.0000 mg | Freq: Once | INTRAMUSCULAR | Status: AC
Start: 2023-10-19 — End: 2023-10-19
  Administered 2023-10-19: 10 mg via INTRA_ARTICULAR

## 2023-10-23 NOTE — Progress Notes (Signed)
 Subjective:   Patient ID: Samantha Pena, female   DOB: 58 y.o.   MRN: [id number]   HPI Patient states she seems to be improving and her daughter got married but she still has some discomfort in the heel in the boot was very helpful   ROS      Objective:  Physical Exam  Neurovascular status intact with exquisite discomfort plantar aspect right heel at the insertional point of the tendon calcaneus with fluid buildup     Assessment:  Acute plantar fasciitis right improved but still present with area of still discomfort     Plan:  H&P reviewed and went ahead today did sterile prep did final injection 3 mg Kenalog  5 mg Xylocaine  instructed on supportive therapy continued boot usage reappoint as symptoms indicate

## 2023-10-25 ENCOUNTER — Other Ambulatory Visit: Payer: Self-pay | Admitting: Hematology and Oncology

## 2023-10-25 DIAGNOSIS — Z1231 Encounter for screening mammogram for malignant neoplasm of breast: Secondary | ICD-10-CM

## 2023-11-23 ENCOUNTER — Encounter: Payer: Self-pay | Admitting: Podiatry

## 2023-11-23 ENCOUNTER — Ambulatory Visit (INDEPENDENT_AMBULATORY_CARE_PROVIDER_SITE_OTHER): Admitting: Podiatry

## 2023-11-23 DIAGNOSIS — M722 Plantar fascial fibromatosis: Secondary | ICD-10-CM | POA: Diagnosis not present

## 2023-11-23 NOTE — Patient Instructions (Signed)

## 2023-11-25 NOTE — Progress Notes (Signed)
 Subjective:   Patient ID: Samantha Pena, female   DOB: 58 y.o.   MRN: [id number]   HPI Patient states she seems to be improved with the boot but it is hard for her to wear and heavy and she gets quite a bit of discomfort in the morning and after periods of sitting   ROS      Objective:  Physical Exam  Neurovascular status intact with discomfort in the right plantar fascia that is improving with pain upon deep palpation but for the most part much better than it was     Assessment:  Acute plantar fasciitis right that seems to gradually be improving but still giving her a lot of problems when she gets up and in the morning     Plan:  H&P reviewed and I have recommended to the continuation of conservative care consisting of shoe gear modifications physical therapy and today I dispensed night splint with all instructions on usage along with aggressive ice therapy.  I fitted it properly to the lower leg all questions were answered and patient will be seen back as symptoms indicate

## 2023-12-05 ENCOUNTER — Ambulatory Visit: Payer: 59 | Admitting: Hematology and Oncology

## 2023-12-08 ENCOUNTER — Other Ambulatory Visit: Payer: Self-pay | Admitting: Podiatry

## 2023-12-10 ENCOUNTER — Ambulatory Visit: Payer: Self-pay | Attending: General Surgery

## 2023-12-10 VITALS — Wt 169.2 lb

## 2023-12-10 DIAGNOSIS — Z483 Aftercare following surgery for neoplasm: Secondary | ICD-10-CM | POA: Insufficient documentation

## 2023-12-10 NOTE — Therapy (Signed)
 OUTPATIENT PHYSICAL THERAPY SOZO SCREENING NOTE   Patient Name: Samantha Pena MRN: [id number] DOB:[birth year], 58 y.o., female Today's Date: 12/10/2023  PCP: Katrinka Garnette KIDD, MD REFERRING PROVIDER: Ebbie Cough, MD   PT End of Session - 12/10/23 1522     Visit Number 5   # unchanged due to screen only   PT Start Time 1520    PT Stop Time 1527    PT Time Calculation (min) 7 min    Activity Tolerance Patient tolerated treatment well    Behavior During Therapy [facility] for tasks assessed/performed          Past Medical History:  Diagnosis Date   Anxiety    Cancer (HCC) 2023   breast   COVID-19    2021   Cyst 01/2011   2016 stable. near rectum that gives pt discomfort when sitting and has grown in size in the past year    History of hiatal hernia    Migraines    Past Surgical History:  Procedure Laterality Date   BREAST CYST EXCISION Left 04/06/2022   Procedure: EXCISION OF LEFT NIPPLE;  Surgeon: Ebbie Cough, MD;  Location: [facility] OR;  Service: General;  Laterality: Left;   BREAST ENHANCEMENT SURGERY Right 12/26/2022   Procedure: MAMMOPLASTY AUGMENTATION (BREAST);  Surgeon: Arelia Filippo, MD;  Location: [facility];  Service: Plastics;  Laterality: Right;   BREAST IMPLANT EXCHANGE Left 01/22/2023   Procedure: SALINE IMPLANT EXCHANGE;  Surgeon: Arelia Filippo, MD;  Location: [facility];  Service: Plastics;  Laterality: Left;   BREAST LUMPECTOMY Left 04/14/2022   Procedure: LEFT BREAST SKIN EXCISION;  Surgeon: Ebbie Cough, MD;  Location: [facility];  Service: General;  Laterality: Left;   BREAST RECONSTRUCTION WITH PLACEMENT OF TISSUE EXPANDER AND ALLODERM Left 01/31/2022   Procedure: BREAST RECONSTRUCTION WITH PLACEMENT OF TISSUE EXPANDER AND ALLODERM;  Surgeon: Arelia Filippo, MD;  Location: [facility];  Service: Plastics;  Laterality: Left;   CERVICAL POLYPECTOMY  02/21/2021   uterus   CESAREAN SECTION  03/18/1999   FOOT SURGERY  Right 09/30/2002   bone in toe had to be shaved and realigned    HEMATOMA EVACUATION Left 01/22/2023   Procedure: DRAINAGE HEMATOMA LEFT CHEST, SALINE IMPLANT EXCHANGE;  Surgeon: Arelia Filippo, MD;  Location: [facility];  Service: Plastics;  Laterality: Left;   LAPAROSCOPIC APPENDECTOMY N/A 10/08/2020   Procedure: APPENDECTOMY LAPAROSCOPIC;  Surgeon: Ebbie Cough, MD;  Location: [facility];  Service: General;  Laterality: N/A;   NIPPLE SPARING MASTECTOMY WITH SENTINEL LYMPH NODE BIOPSY Left 01/31/2022   Procedure: LEFT NIPPLE SPARING MASTECTOMY WITH AXILLARY SENTINEL LYMPH NODE BIOPSY;  Surgeon: Ebbie Cough, MD;  Location: [facility];  Service: General;  Laterality: Left;   REMOVAL OF BILATERAL TISSUE EXPANDERS WITH PLACEMENT OF BILATERAL BREAST IMPLANTS Bilateral 12/26/2022   Procedure: REMOVAL OF LEFT TISSUE EXPANDER WITH IMMEDIATE PLACEMENT OF SALINE IMPLANT BILATERAL;  Surgeon: Arelia Filippo, MD;  Location: [facility];  Service: Plastics;  Laterality: Bilateral;   Patient Active Problem List   Diagnosis Date Noted   S/P left mastectomy 01/31/2022   Malignant 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, estrogen receptor positive (HCC) 12/28/2021   S/P laparoscopic appendectomy 10/08/2020   History of Lyme disease 11/12/2018   Urge incontinence 11/12/2017   Overweight 05/07/2017   Allergic rhinitis 05/07/2017   Hyperlipidemia 10/31/2016   TMJ disease 03/17/2015   Anal benign neoplasm 02/14/2011   ACTINIC KERATOSIS, CHEEK, LEFT 11/13/2007  Migraine headache 10/30/2006    REFERRING DIAG: left breast cancer at risk for lymphedema  THERAPY DIAG: Aftercare following surgery for neoplasm  PERTINENT HISTORY: Patient was diagnosed on 12/19/2021 with left grade 2 invasive ductal carcinoma breast cancer. She underwent a left mastectomy with immediate reconstruction with expanders placed and a sentinel node biopsy (1/1 node positive) on  01/31/2022.  She had a left nipple resection on 04/14/2022.It is ER/PR positive and HER2 negative with a Ki67 of 10%. Radiation ended 07/07/2022   PRECAUTIONS: left UE Lymphedema risk, None  SUBJECTIVE: Pt returns for her 3 month  L-Dex screen.   PAIN:  Are you having pain? No  SOZO SCREENING: Patient was assessed today using the SOZO machine to determine the lymphedema index score. This was compared to her baseline score. It was determined that she is within the recommended range when compared to her baseline and no further action is needed at this time. She will continue SOZO screenings. These are done every 3 months for 2 years post operatively followed by every 6 months for 2 years, and then annually.   L-DEX FLOWSHEETS - 12/10/23 1500       L-DEX LYMPHEDEMA SCREENING   Measurement Type Unilateral    L-DEX MEASUREMENT EXTREMITY Upper Extremity    POSITION  Standing    DOMINANT SIDE Right    At Risk Side Left    BASELINE SCORE (UNILATERAL) -0.2    L-DEX SCORE (UNILATERAL) -0.5    VALUE CHANGE (UNILAT) -0.3            Aden Berwyn Caldron, PTA 12/10/2023, 3:27 PM

## 2023-12-11 ENCOUNTER — Ambulatory Visit

## 2023-12-29 ENCOUNTER — Other Ambulatory Visit: Payer: Self-pay | Admitting: Family Medicine

## 2024-01-01 ENCOUNTER — Ambulatory Visit
Admission: RE | Admit: 2024-01-01 | Discharge: 2024-01-01 | Disposition: A | Discharge status: Home / Self Care | Payer: Self-pay | Source: Ambulatory Visit | Attending: Hematology and Oncology | Admitting: Hematology and Oncology

## 2024-01-01 DIAGNOSIS — Z1231 Encounter for screening mammogram for malignant neoplasm of breast: Secondary | ICD-10-CM

## 2024-01-01 HISTORY — DX: Personal history of irradiation: Z92.3

## 2024-03-10 ENCOUNTER — Ambulatory Visit: Payer: Self-pay | Attending: General Surgery

## 2024-03-10 VITALS — Wt 172.2 lb

## 2024-03-10 DIAGNOSIS — Z483 Aftercare following surgery for neoplasm: Secondary | ICD-10-CM | POA: Insufficient documentation

## 2024-03-10 NOTE — Therapy (Signed)
 OUTPATIENT PHYSICAL THERAPY SOZO SCREENING NOTE   Patient Name: Samantha Pena MRN: [id number] DOB:[birth year], 58 y.o., female Today's Date: 03/10/2024  PCP: Katrinka Garnette KIDD, MD REFERRING PROVIDER: Ebbie Cough, MD   PT End of Session - 03/10/24 1531     Visit Number 5   # unchanged due to screen only   PT Start Time 1528    PT Stop Time 1532    PT Time Calculation (min) 4 min    Activity Tolerance Patient tolerated treatment well    Behavior During Therapy [facility] for tasks assessed/performed          Past Medical History:  Diagnosis Date   Anxiety    Cancer (HCC) 2023   breast   COVID-19    2021   Cyst 01/2011   2016 stable. near rectum that gives pt discomfort when sitting and has grown in size in the past year    History of hiatal hernia    Migraines    Personal history of radiation therapy    Past Surgical History:  Procedure Laterality Date   AUGMENTATION MAMMAPLASTY Bilateral    BREAST CYST EXCISION Left 04/06/2022   Procedure: EXCISION OF LEFT NIPPLE;  Surgeon: Ebbie Cough, MD;  Location: [facility] OR;  Service: General;  Laterality: Left;   BREAST ENHANCEMENT SURGERY Right 12/26/2022   Procedure: MAMMOPLASTY AUGMENTATION (BREAST);  Surgeon: Arelia Filippo, MD;  Location: [facility];  Service: Plastics;  Laterality: Right;   BREAST IMPLANT EXCHANGE Left 01/22/2023   Procedure: SALINE IMPLANT EXCHANGE;  Surgeon: Arelia Filippo, MD;  Location: [facility];  Service: Plastics;  Laterality: Left;   BREAST LUMPECTOMY Left 04/14/2022   Procedure: LEFT BREAST SKIN EXCISION;  Surgeon: Ebbie Cough, MD;  Location: [facility];  Service: General;  Laterality: Left;   BREAST RECONSTRUCTION WITH PLACEMENT OF TISSUE EXPANDER AND ALLODERM Left 01/31/2022   Procedure: BREAST RECONSTRUCTION WITH PLACEMENT OF TISSUE EXPANDER AND ALLODERM;  Surgeon: Arelia Filippo, MD;  Location: [facility];  Service: Plastics;  Laterality: Left;    CERVICAL POLYPECTOMY  02/21/2021   uterus   CESAREAN SECTION  03/18/1999   FOOT SURGERY Right 09/30/2002   bone in toe had to be shaved and realigned    HEMATOMA EVACUATION Left 01/22/2023   Procedure: DRAINAGE HEMATOMA LEFT CHEST, SALINE IMPLANT EXCHANGE;  Surgeon: Arelia Filippo, MD;  Location: [facility];  Service: Plastics;  Laterality: Left;   LAPAROSCOPIC APPENDECTOMY N/A 10/08/2020   Procedure: APPENDECTOMY LAPAROSCOPIC;  Surgeon: Ebbie Cough, MD;  Location: [facility] OR;  Service: General;  Laterality: N/A;   NIPPLE SPARING MASTECTOMY WITH SENTINEL LYMPH NODE BIOPSY Left 01/31/2022   Procedure: LEFT NIPPLE SPARING MASTECTOMY WITH AXILLARY SENTINEL LYMPH NODE BIOPSY;  Surgeon: Ebbie Cough, MD;  Location: [facility];  Service: General;  Laterality: Left;   REMOVAL OF BILATERAL TISSUE EXPANDERS WITH PLACEMENT OF BILATERAL BREAST IMPLANTS Bilateral 12/26/2022   Procedure: REMOVAL OF LEFT TISSUE EXPANDER WITH IMMEDIATE PLACEMENT OF SALINE IMPLANT BILATERAL;  Surgeon: Arelia Filippo, MD;  Location: [facility];  Service: Plastics;  Laterality: Bilateral;   Patient Active Problem List   Diagnosis Date Noted   S/P left mastectomy 01/31/2022   Malignant 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, estrogen receptor positive (HCC) 12/28/2021   S/P laparoscopic appendectomy 10/08/2020   History of Lyme disease 11/12/2018   Urge incontinence 11/12/2017   Overweight 05/07/2017   Allergic rhinitis 05/07/2017   Hyperlipidemia 10/31/2016   TMJ disease 03/17/2015  Anal benign neoplasm 02/14/2011   ACTINIC KERATOSIS, CHEEK, LEFT 11/13/2007   Migraine headache 10/30/2006    REFERRING DIAG: left breast cancer at risk for lymphedema  THERAPY DIAG: Aftercare following surgery for neoplasm  PERTINENT HISTORY: Patient was diagnosed on 12/19/2021 with left grade 2 invasive ductal carcinoma breast cancer. She underwent a left mastectomy with  immediate reconstruction with expanders placed and a sentinel node biopsy (1/1 node positive) on 01/31/2022.  She had a left nipple resection on 04/14/2022.It is ER/PR positive and HER2 negative with a Ki67 of 10%. Radiation ended 07/07/2022   PRECAUTIONS: left UE Lymphedema risk, None  SUBJECTIVE: Pt returns for her last 3 month  L-Dex screen.   PAIN:  Are you having pain? No  SOZO SCREENING: Patient was assessed today using the SOZO machine to determine the lymphedema index score. This was compared to her baseline score. It was determined that she is within the recommended range when compared to her baseline and no further action is needed at this time. She will continue SOZO screenings. These are done every 3 months for 2 years post operatively followed by every 6 months for 2 years, and then annually.   L-DEX FLOWSHEETS - 03/10/24 1500       L-DEX LYMPHEDEMA SCREENING   Measurement Type Unilateral    L-DEX MEASUREMENT EXTREMITY Upper Extremity    POSITION  Standing    DOMINANT SIDE Right    At Risk Side Left    BASELINE SCORE (UNILATERAL) -0.2    L-DEX SCORE (UNILATERAL) 0.6    VALUE CHANGE (UNILAT) 0.8         P: Begin 6 month L-Dex screens until 01/2026, then can transition to annual.    Aden Berwyn Caldron, PTA 03/10/2024, 3:33 PM

## 2024-04-25 ENCOUNTER — Other Ambulatory Visit: Payer: Self-pay | Admitting: Hematology and Oncology

## 2024-06-02 ENCOUNTER — Inpatient Hospital Stay: Payer: Self-pay | Admitting: Hematology and Oncology

## 2024-07-01 ENCOUNTER — Inpatient Hospital Stay: Payer: Self-pay | Admitting: Hematology and Oncology

## 2024-09-08 ENCOUNTER — Ambulatory Visit: Payer: Self-pay | Attending: General Surgery
